# Patient Record
Sex: Female | Born: 1950 | Race: White | Hispanic: No | Marital: Married | Smoking: Former smoker
Health system: Southern US, Community
[De-identification: ages and names within clinical notes are randomized; demographics above are authoritative.]

## PROBLEM LIST (undated history)

## (undated) DIAGNOSIS — B019 Varicella without complication: Secondary | ICD-10-CM

## (undated) DIAGNOSIS — M51369 Other intervertebral disc degeneration, lumbar region without mention of lumbar back pain or lower extremity pain: Secondary | ICD-10-CM

## (undated) DIAGNOSIS — Z87440 Personal history of urinary (tract) infections: Secondary | ICD-10-CM

## (undated) DIAGNOSIS — E785 Hyperlipidemia, unspecified: Secondary | ICD-10-CM

## (undated) DIAGNOSIS — L409 Psoriasis, unspecified: Secondary | ICD-10-CM

## (undated) DIAGNOSIS — M5136 Other intervertebral disc degeneration, lumbar region: Secondary | ICD-10-CM

## (undated) DIAGNOSIS — K219 Gastro-esophageal reflux disease without esophagitis: Secondary | ICD-10-CM

## (undated) DIAGNOSIS — I10 Essential (primary) hypertension: Secondary | ICD-10-CM

## (undated) DIAGNOSIS — M5126 Other intervertebral disc displacement, lumbar region: Secondary | ICD-10-CM

## (undated) DIAGNOSIS — M1990 Unspecified osteoarthritis, unspecified site: Secondary | ICD-10-CM

## (undated) HISTORY — DX: Personal history of urinary (tract) infections: Z87.440

## (undated) HISTORY — DX: Unspecified osteoarthritis, unspecified site: M19.90

## (undated) HISTORY — DX: Essential (primary) hypertension: I10

## (undated) HISTORY — DX: Varicella without complication: B01.9

## (undated) HISTORY — PX: FRACTURE SURGERY: SHX138

## (undated) HISTORY — DX: Gastro-esophageal reflux disease without esophagitis: K21.9

## (undated) HISTORY — DX: Hyperlipidemia, unspecified: E78.5

---

## 2009-06-27 HISTORY — PX: CHOLECYSTECTOMY: SHX55

## 2010-01-19 ENCOUNTER — Emergency Department: Payer: Self-pay | Admitting: Emergency Medicine

## 2010-02-01 ENCOUNTER — Ambulatory Visit: Payer: Self-pay | Admitting: Surgery

## 2010-02-09 ENCOUNTER — Ambulatory Visit: Payer: Self-pay | Admitting: Surgery

## 2012-10-02 ENCOUNTER — Ambulatory Visit: Payer: Self-pay

## 2012-11-29 ENCOUNTER — Ambulatory Visit: Payer: Self-pay | Admitting: Urology

## 2013-04-17 ENCOUNTER — Ambulatory Visit: Payer: Self-pay | Admitting: Urology

## 2013-04-30 ENCOUNTER — Ambulatory Visit: Payer: Self-pay | Admitting: Internal Medicine

## 2013-05-27 ENCOUNTER — Ambulatory Visit: Payer: Self-pay | Admitting: Internal Medicine

## 2013-07-22 ENCOUNTER — Ambulatory Visit: Payer: Self-pay | Admitting: Internal Medicine

## 2013-07-23 ENCOUNTER — Ambulatory Visit: Payer: Self-pay | Admitting: Internal Medicine

## 2013-07-28 ENCOUNTER — Ambulatory Visit: Payer: Self-pay | Admitting: Internal Medicine

## 2013-07-28 HISTORY — PX: BIOPSY THYROID: PRO38

## 2013-08-28 ENCOUNTER — Ambulatory Visit (INDEPENDENT_AMBULATORY_CARE_PROVIDER_SITE_OTHER): Payer: BC Managed Care – PPO | Admitting: Adult Health

## 2013-08-28 ENCOUNTER — Encounter: Payer: Self-pay | Admitting: Adult Health

## 2013-08-28 VITALS — BP 135/93 | HR 87 | Temp 98.2°F | Resp 14 | Ht 65.7 in | Wt 236.0 lb

## 2013-08-28 DIAGNOSIS — R42 Dizziness and giddiness: Secondary | ICD-10-CM

## 2013-08-28 DIAGNOSIS — E559 Vitamin D deficiency, unspecified: Secondary | ICD-10-CM | POA: Insufficient documentation

## 2013-08-28 LAB — COMPREHENSIVE METABOLIC PANEL
ALT: 37 U/L — ABNORMAL HIGH (ref 0–35)
AST: 27 U/L (ref 0–37)
Albumin: 3.8 g/dL (ref 3.5–5.2)
Alkaline Phosphatase: 101 U/L (ref 39–117)
BILIRUBIN TOTAL: 0.9 mg/dL (ref 0.3–1.2)
BUN: 12 mg/dL (ref 6–23)
CO2: 26 meq/L (ref 19–32)
CREATININE: 0.8 mg/dL (ref 0.4–1.2)
Calcium: 9.6 mg/dL (ref 8.4–10.5)
Chloride: 105 mEq/L (ref 96–112)
GFR: 72.64 mL/min (ref >60.00)
GLUCOSE: 88 mg/dL (ref 70–99)
Potassium: 5 mEq/L (ref 3.5–5.1)
SODIUM: 139 meq/L (ref 135–145)
TOTAL PROTEIN: 6.7 g/dL (ref 6.0–8.3)

## 2013-08-28 LAB — MAGNESIUM: MAGNESIUM: 2.1 mg/dL (ref 1.5–2.5)

## 2013-08-28 NOTE — Patient Instructions (Signed)
   Thank you for choosing [place] [facility] for your health care needs.  Please have your labs drawn prior to leaving the office.  The results will be available through MyChart for your convenience. Please remember to activate this. The activation code is located at the end of this form. If you have not set this up we will contact you via telephone.  Schedule your physical exam including breast exam and PAP in June. At that time I will send you for a mammogram and for a screening colonoscopy. I will also do blood work so it would be best if you fast the night before - nothing to eat or drink after midnight. You may drink water.

## 2013-08-28 NOTE — Progress Notes (Signed)
Pre visit review using our clinic review tool, if applicable. No additional management support is needed unless otherwise documented below in the visit note. 

## 2013-08-28 NOTE — Progress Notes (Signed)
Patient ID: Alice Campbell, female   DOB: 01/14/1951, 63 y.o.   MRN: [id number]    Subjective:    Patient ID: Alice Campbell, female    DOB: 01/14/1951, 63 y.o.   MRN: [id number]  HPI  Pt is a pleasant 63 y/o female who presents to clinic to establish care. She has not had a PCP in many years. Recently she is being followed by Dr. Sherrlyn HockPandit for abnormal CT finding. She reports that the area on her lungs are very small and they are currently just monitoring. She has a follow up appt with Dr. Sherrlyn HockPandit in July for f/u CT. She is feeling well over all. She has not had a screening colonoscopy and her last mammogram was ~ 4 years ago. Does not remember when her last PAP was but believes it was 3-4 years ago. Pt reports being under considerable amount of stress 2/2 her abnormal CT. She does not wish to address any screening test at this time. She would like to wait a while. Pt reports hx of vitamin D deficiency. She has been on high dose Vit D but has not taken any supplements for a while.    Past Medical History  Diagnosis Date  . Arthritis   . Chicken pox   . GERD (gastroesophageal reflux disease)   . Hypertension   . History of UTI     [facility]     Past Surgical History  Procedure Laterality Date  . Cholecystectomy  2011  . Biopsy thyroid  07/2013     Family History  Problem Relation Age of Onset  . Heart disease Mother   . Hypertension Mother   . Mental illness Mother     Nervous breakdown   . Cancer Father     Prostate Cancer  . Heart disease Father   . Hypertension Father      History   Social History  . Marital Status: Married    Spouse Name: N/A    Number of Children: N/A  . Years of Education: 12   Occupational History  . Public Work     Semi-retired   Social History Main Topics  . Smoking status: Former Games developermoker  . Smokeless tobacco: Not on file     Comment: quit in 2000  . Alcohol Use: 1 - 2 oz/week    2-4 drink(s) per week     Comment: few drinks a week    . Drug Use: No  . Sexual Activity: Not on file   Other Topics Concern  . Not on file   Social History Narrative   [name] grew up in [place], [place]. She is currently living in [place] with her husband. She enjoys swimming and reading.     Review of Systems  Constitutional: Negative.   Respiratory: Negative.   Cardiovascular: Negative for chest pain and leg swelling.  Neurological: Negative for tremors, syncope, facial asymmetry, weakness, light-headedness, numbness and headaches. Dizziness: vertigo x 5-6 years - comes and goes.       Objective:  BP 135/93  Pulse 87  Temp(Src) 98.2 F (36.8 C) (Oral)  Resp 14  Ht 5' 5.7" (1.669 m)  Wt 236 lb (107.049 kg)  BMI 38.43 kg/m2  SpO2 97%   Physical Exam  Constitutional: She is oriented to person, place, and time. She appears well-developed and well-nourished. No distress.  Overweight, pleasant 63 y/o female in NAD  HENT:  Head: Normocephalic and atraumatic.  Right Ear: External ear normal.  Left Ear: External  ear normal.  Eyes: Conjunctivae and EOM are normal. Pupils are equal, round, and reactive to light.  Neck: Normal range of motion. Neck supple. No tracheal deviation present.  Cardiovascular: Normal rate, regular rhythm, normal heart sounds and intact distal pulses.  Exam reveals no gallop and no friction rub.   No murmur heard. Pulmonary/Chest: Effort normal and breath sounds normal. No respiratory distress. She has no wheezes. She has no rales.  Musculoskeletal: Normal range of motion. She exhibits no edema.  Neurological: She is alert and oriented to person, place, and time. She has normal reflexes. Coordination normal.  Skin: Skin is warm and dry.  Psychiatric: She has a normal mood and affect. Her behavior is normal. Judgment and thought content normal.       Assessment & Plan:   1. Vertigo Reports symptoms "off and on". Has used Antivert in the past with good results. Has not been evaluated by PT. We  discussed this as on option if symptoms persist. Discussed referral to ENT if necessary. Does not wish to have referral to either at this time. Will check metabolic panel.  - Comprehensive metabolic panel - Magnesium  2. Vitamin D deficiency With her reported hx, I will check levels and treat accordingly. If level WNL will have patient take OTC supplement. - Vit D  25 hydroxy (rtn osteoporosis monitoring)

## 2013-08-29 LAB — VITAMIN D 25 HYDROXY (VIT D DEFICIENCY, FRACTURES): Vit D, 25-Hydroxy: 32 ng/mL (ref 30–89)

## 2013-08-30 ENCOUNTER — Encounter: Payer: Self-pay | Admitting: *Deleted

## 2013-10-25 ENCOUNTER — Encounter: Payer: Self-pay | Admitting: Adult Health

## 2013-10-25 ENCOUNTER — Ambulatory Visit (INDEPENDENT_AMBULATORY_CARE_PROVIDER_SITE_OTHER): Payer: BC Managed Care – PPO | Admitting: Adult Health

## 2013-10-25 VITALS — BP 132/84 | HR 87 | Temp 98.0°F | Resp 14 | Wt 236.0 lb

## 2013-10-25 DIAGNOSIS — R109 Unspecified abdominal pain: Secondary | ICD-10-CM

## 2013-10-25 DIAGNOSIS — N390 Urinary tract infection, site not specified: Secondary | ICD-10-CM | POA: Insufficient documentation

## 2013-10-25 LAB — POCT URINALYSIS DIPSTICK
Glucose, UA: NEGATIVE
Leukocytes, UA: NEGATIVE
Nitrite, UA: NEGATIVE
SPEC GRAV UA: 1.025
UROBILINOGEN UA: 1
pH, UA: 5.5

## 2013-10-25 MED ORDER — NITROFURANTOIN MONOHYD MACRO 100 MG PO CAPS: 100.0000 mg | ORAL_CAPSULE | Freq: Two times a day (BID) | ORAL | Status: DC

## 2013-10-25 MED ORDER — ONDANSETRON HCL 4 MG PO TABS: 4.0000 mg | ORAL_TABLET | Freq: Three times a day (TID) | ORAL | Status: DC | PRN

## 2013-10-25 NOTE — Progress Notes (Signed)
Pre visit review using our clinic review tool, if applicable. No additional management support is needed unless otherwise documented below in the visit note. 

## 2013-10-25 NOTE — Progress Notes (Signed)
Patient ID: Alice Campbell, female   DOB: 1951-05-08, 63 y.o.   MRN: [id number]   Subjective:    Patient ID: Alice Churchesamela Joplin, female    DOB: 1951-05-08, 63 y.o.   MRN: [id number]  HPI  Patient is a pleasant 63 year old female first seen in my clinic to establish care on 08/28/2013. She presents to clinic today with concerns of recurrent UTIs. She reports that these have been ongoing for approximately 3 years. For the first year she was seen multiple times at urgent care. Then she established care with Michiel CowboyShannon McGowan, PA at [facility]. Patient reports that in the period of time that she has been to see Ms. McGowan she has had CT scan, cystoscopy, stretching of the urethra. She reports being on multiple courses of antibiotics without resolution of her recurrent symptoms. She was seen by Ms. McGowan approximately 10 days ago and was started on amoxicillin for a urinary tract infection. Patient reports completing antibiotics approximately 2 days ago. She has been nauseated, complaints of flank pain (although none at the present time). Ms. Marvel PlanMcGowan wanted to send patient for a repeat CT scan to rule out kidney stone. Patient had an appointment but called and canceled. She reports that she has already had a CT scan when she was having the exact same symptoms and there was no stone detected. She has had 2 recurrences with hematuria I detected. Patient reports that these were also worked up. Patient is extremely frustrated that she keeps having the same problem over and over. She would like a second opinion.    Past Medical History  Diagnosis Date  . Arthritis   . Chicken pox   . GERD (gastroesophageal reflux disease)   . Hypertension   . History of UTI     [facility]     Past Surgical History  Procedure Laterality Date  . Cholecystectomy  2011  . Biopsy thyroid  07/2013     Family History  Problem Relation Age of Onset  . Heart disease Mother   . Hypertension Mother   . Mental  illness Mother     Nervous breakdown   . Cancer Father     Prostate Cancer  . Heart disease Father   . Hypertension Father      History   Social History  . Marital Status: Married    Spouse Name: N/A    Number of Children: N/A  . Years of Education: 12   Occupational History  . Public Work     Semi-retired   Social History Main Topics  . Smoking status: Former Games developermoker  . Smokeless tobacco: Not on file     Comment: quit in 2000  . Alcohol Use: 1.0 - 2.0 oz/week    2-4 drink(s) per week     Comment: few drinks a week   . Drug Use: No  . Sexual Activity: Not on file   Other Topics Concern  . Not on file   Social History Narrative   Mrs. Culton grew up in [place], [place]. She is currently living in [place] with her husband. She enjoys swimming and reading.     Review of Systems  Constitutional: Negative for fever and chills.  Endocrine:       Pt is not a diabetic  Genitourinary: Positive for dysuria, urgency and frequency. Negative for hematuria, flank pain and pelvic pain.       She denies hx of bladder prolapse  All other systems reviewed and are negative.  Objective:  BP 132/84  Pulse 87  Temp(Src) 98 F (36.7 C) (Oral)  Resp 14  Wt 236 lb (107.049 kg)  SpO2 99%   Physical Exam  Constitutional: She is oriented to person, place, and time. No distress.  Overweight, pleasant 63 year old female in no apparent distress  HENT:  Head: Normocephalic and atraumatic.  Neck: Normal range of motion. Neck supple.  Abdominal: Soft. She exhibits no mass. There is no tenderness. There is no rebound and no guarding.  Genitourinary:  No suprapubic tenderness upon palpation. Patient does not exhibit any costovertebral angle tenderness.  Musculoskeletal: Normal range of motion.  Neurological: She is alert and oriented to person, place, and time. She has normal reflexes. Coordination normal.  Skin: Skin is warm and dry.  Psychiatric: She has a normal mood and  affect. Her behavior is normal. Judgment and thought content normal.      Assessment & Plan:   1. Flank pain She is currently not experiencing any flank pain, nausea or vomiting. Her recent symptoms are suspicious for kidney stones. As mentioned in the history of present illness, patient had an appointment to have a CT scan done but she canceled her appointment. I will check her kidney function today.  - Basic Metabolic Panel (BMET)  2. Recurrent UTI Just completed antibiotics 2 days ago and already having symptoms. I am referring her to [facility] for further evaluation and also for the second opinion that she is requesting. I am starting her on Macrobid 100 mg twice a day x2 weeks.   - Ambulatory referral to Urogynecology - Basic Metabolic Panel (BMET) - POCT urinalysis dipstick - Basic Metabolic Panel (BMET)

## 2013-10-25 NOTE — Patient Instructions (Signed)
  Macrobid 100 mg twice a day for 2 weeks.  Make sure he drinks fluids to stay hydrated.  I am sending in a prescription for Zofran for nausea.  I am also referring you to [facility] for second opinion.  Vitamin D3 - 2000 units daily. Your levels were normal but on the lower end.

## 2013-10-26 LAB — BASIC METABOLIC PANEL
BUN: 13 mg/dL (ref 6–23)
CALCIUM: 9.3 mg/dL (ref 8.4–10.5)
CO2: 26 mEq/L (ref 19–32)
Chloride: 106 mEq/L (ref 96–112)
Creat: 0.93 mg/dL (ref 0.50–1.10)
GLUCOSE: 122 mg/dL — AB (ref 70–99)
Potassium: 4.3 mEq/L (ref 3.5–5.3)
SODIUM: 141 meq/L (ref 135–145)

## 2013-11-04 ENCOUNTER — Telehealth: Payer: Self-pay | Admitting: Adult Health

## 2013-11-04 NOTE — Telephone Encounter (Signed)
Patient is still having some issues (unsure what) and wanted to know if she should see you before her appointment with [facility]. Please advise.

## 2013-11-04 NOTE — Telephone Encounter (Signed)
She was having issues with UTI when she came to see me. Please call and find out if anything has changed. What specific symptoms is she having. If she is hurting she will need to see me.

## 2013-11-04 NOTE — Telephone Encounter (Signed)
I called patient to let her know that her appointment with [facility]  was for November 25, 2013 at 11:00. She states that she is still having issues and wanted to make sure you knew when her appointment was and to see if she needed to come back and see you. Please advise

## 2013-11-05 NOTE — Telephone Encounter (Signed)
Patient states that she was hurting on Sunday but today she feels better. Informed her of Raquel's comments. Patient verbalized understanding. She stated that she would be out of town until Friday and that is when her antibiotics will be complete. She prefers to finish the antibiotic course and if she is still having symptoms she will call on Monday to schedule an appointment with Raquel at that time.

## 2013-11-12 ENCOUNTER — Ambulatory Visit (INDEPENDENT_AMBULATORY_CARE_PROVIDER_SITE_OTHER): Payer: BC Managed Care – PPO | Admitting: Adult Health

## 2013-11-12 ENCOUNTER — Encounter: Payer: Self-pay | Admitting: Adult Health

## 2013-11-12 VITALS — BP 140/88 | HR 80 | Temp 98.1°F | Resp 14 | Wt 236.0 lb

## 2013-11-12 DIAGNOSIS — N390 Urinary tract infection, site not specified: Secondary | ICD-10-CM

## 2013-11-12 DIAGNOSIS — R350 Frequency of micturition: Secondary | ICD-10-CM

## 2013-11-12 LAB — POCT URINALYSIS DIPSTICK
Bilirubin, UA: NEGATIVE
Glucose, UA: NEGATIVE
Ketones, UA: NEGATIVE
NITRITE UA: POSITIVE
PH UA: 6
PROTEIN UA: NEGATIVE
Spec Grav, UA: 1.02
UROBILINOGEN UA: 0.2

## 2013-11-12 MED ORDER — CIPROFLOXACIN HCL 250 MG PO TABS: 250.0000 mg | ORAL_TABLET | Freq: Two times a day (BID) | ORAL | Status: DC

## 2013-11-12 NOTE — Progress Notes (Signed)
Pre visit review using our clinic review tool, if applicable. No additional management support is needed unless otherwise documented below in the visit note. 

## 2013-11-12 NOTE — Patient Instructions (Signed)
  Start Cipro 250 mg twice daily for 7 days.  AZO for your discomfort. Take as directed.  Avoid tea and dark carbonated drinks.  Drink water  Please call with any questions or concerns.

## 2013-11-12 NOTE — Progress Notes (Signed)
   Subjective:    Patient ID: Alice Campbell, female    DOB: April 01, 1951, 63 y.o.   MRN: [id number]  HPI Patient is a pleasant 63 year old female who presents to clinic with dysuria. History of recurrent urinary tract infections. She is scheduled with [facility] on 11/25/2013 for second opinion. Patient had been followed by [place]rologist in [place] without any improvement of her symptoms. She reports also experiencing some suprapubic discomfort and urgency. Denies hematuria. She is sexually active; however, she reports no sexual intercourse in greater than four-week. She drinks carbonated beverages and lots of tea. She has tried cranberry tablets in the past without any improvement. Reports that the only thing that helps her symptoms is AZO.   Past Medical History  Diagnosis Date  . Arthritis   . Chicken pox   . GERD (gastroesophageal reflux disease)   . Hypertension   . History of UTI     [facility]    Current Outpatient Prescriptions on File Prior to Visit  Medication Sig Dispense Refill  . diclofenac (VOLTAREN) 75 MG EC tablet Take 75 mg by mouth once.      . ondansetron (ZOFRAN) 4 MG tablet Take 1 tablet (4 mg total) by mouth every 8 (eight) hours as needed for nausea or vomiting.  20 tablet  0   No current facility-administered medications on file prior to visit.   Review of Systems  Constitutional: Negative for fever and chills.  Genitourinary: Positive for dysuria, urgency and frequency. Negative for hematuria, flank pain and difficulty urinating.       Suprapubic discomfort  All other systems reviewed and are negative.      Objective:   Physical Exam  Constitutional: She is oriented to person, place, and time. No distress.  Cardiovascular: Normal rate and regular rhythm.   Pulmonary/Chest: Effort normal. No respiratory distress.  Musculoskeletal: Normal range of motion.  Neurological: She is alert and oriented to person, place, and time.  Skin: Skin is warm  and dry.  Psychiatric: She has a normal mood and affect. Her behavior is normal. Judgment and thought content normal.   BP 140/88  Pulse 80  Temp(Src) 98.1 F (36.7 C) (Oral)  Resp 14  Wt 236 lb (107.049 kg)  SpO2 98%      Assessment & Plan:   1. Frequent urination UA positive for nitrites, blood and leukocytes. Send urine for culture - POCT Urinalysis Dipstick - Urine Culture  2. Recurrent UTI Recently treated for UTI now presents with symptoms again. App[name] on 11/25/13. Pt is extremely frustrated with her symptoms and recurrence. Will start Cipro 250 bid x 7 days. I do not want her to be on any antibiotic for her appointment with urology. She verbalizes understanding. Advised to also take AZO for her symptoms. Call with any concerns.

## 2013-11-14 LAB — URINE CULTURE

## 2013-11-25 DIAGNOSIS — IMO0002 Reserved for concepts with insufficient information to code with codable children: Secondary | ICD-10-CM | POA: Insufficient documentation

## 2013-12-09 ENCOUNTER — Other Ambulatory Visit (HOSPITAL_COMMUNITY)
Admission: RE | Admit: 2013-12-09 | Discharge: 2013-12-09 | Disposition: A | Discharge status: Home / Self Care | Payer: BC Managed Care – PPO | Source: Ambulatory Visit | Attending: Adult Health | Admitting: Adult Health

## 2013-12-09 ENCOUNTER — Ambulatory Visit (INDEPENDENT_AMBULATORY_CARE_PROVIDER_SITE_OTHER): Payer: BC Managed Care – PPO | Admitting: Adult Health

## 2013-12-09 ENCOUNTER — Encounter: Payer: Self-pay | Admitting: Adult Health

## 2013-12-09 VITALS — BP 130/80 | HR 67 | Temp 98.4°F | Resp 14 | Ht 65.7 in | Wt 233.5 lb

## 2013-12-09 DIAGNOSIS — Z1211 Encounter for screening for malignant neoplasm of colon: Secondary | ICD-10-CM

## 2013-12-09 DIAGNOSIS — R5381 Other malaise: Secondary | ICD-10-CM

## 2013-12-09 DIAGNOSIS — R5383 Other fatigue: Secondary | ICD-10-CM

## 2013-12-09 DIAGNOSIS — Z0000 Encounter for general adult medical examination without abnormal findings: Secondary | ICD-10-CM

## 2013-12-09 DIAGNOSIS — Z01419 Encounter for gynecological examination (general) (routine) without abnormal findings: Secondary | ICD-10-CM | POA: Insufficient documentation

## 2013-12-09 DIAGNOSIS — Z124 Encounter for screening for malignant neoplasm of cervix: Secondary | ICD-10-CM

## 2013-12-09 DIAGNOSIS — Z1151 Encounter for screening for human papillomavirus (HPV): Secondary | ICD-10-CM | POA: Insufficient documentation

## 2013-12-09 DIAGNOSIS — Z1239 Encounter for other screening for malignant neoplasm of breast: Secondary | ICD-10-CM

## 2013-12-09 DIAGNOSIS — Z23 Encounter for immunization: Secondary | ICD-10-CM

## 2013-12-09 LAB — CBC WITH DIFFERENTIAL/PLATELET
Basophils Absolute: 0 10*3/uL (ref 0.0–0.1)
Basophils Relative: 0.8 % (ref 0.0–3.0)
Eosinophils Absolute: 0.1 10*3/uL (ref 0.0–0.7)
Eosinophils Relative: 3.2 % (ref 0.0–5.0)
HCT: 45 % (ref 36.0–46.0)
Hemoglobin: 15.1 g/dL — ABNORMAL HIGH (ref 12.0–15.0)
LYMPHS ABS: 1.4 10*3/uL (ref 0.7–4.0)
Lymphocytes Relative: 36.3 % (ref 12.0–46.0)
MCHC: 33.4 g/dL (ref 30.0–36.0)
MCV: 95.1 fl (ref 78.0–100.0)
MONO ABS: 0.4 10*3/uL (ref 0.1–1.0)
Monocytes Relative: 9.9 % (ref 3.0–12.0)
Neutro Abs: 1.9 10*3/uL (ref 1.4–7.7)
Neutrophils Relative %: 49.8 % (ref 43.0–77.0)
PLATELETS: 141 10*3/uL — AB (ref 150.0–400.0)
RBC: 4.73 Mil/uL (ref 3.87–5.11)
RDW: 13.4 % (ref 11.5–15.5)
WBC: 3.8 10*3/uL — ABNORMAL LOW (ref 4.0–10.5)

## 2013-12-09 LAB — COMPREHENSIVE METABOLIC PANEL
ALK PHOS: 82 U/L (ref 39–117)
ALT: 22 U/L (ref 0–35)
AST: 22 U/L (ref 0–37)
Albumin: 4 g/dL (ref 3.5–5.2)
BILIRUBIN TOTAL: 0.5 mg/dL (ref 0.2–1.2)
BUN: 14 mg/dL (ref 6–23)
CO2: 28 mEq/L (ref 19–32)
Calcium: 9.3 mg/dL (ref 8.4–10.5)
Chloride: 104 mEq/L (ref 96–112)
Creatinine, Ser: 0.9 mg/dL (ref 0.4–1.2)
GFR: 66.17 mL/min (ref >60.00)
Glucose, Bld: 107 mg/dL — ABNORMAL HIGH (ref 70–99)
Potassium: 4.8 mEq/L (ref 3.5–5.1)
Sodium: 136 mEq/L (ref 135–145)
TOTAL PROTEIN: 6.9 g/dL (ref 6.0–8.3)

## 2013-12-09 LAB — LIPID PANEL
CHOLESTEROL: 249 mg/dL — AB (ref 0–200)
HDL: 54 mg/dL (ref >39.00)
LDL Cholesterol: 147 mg/dL — ABNORMAL HIGH (ref 0–99)
NonHDL: 195
Total CHOL/HDL Ratio: 5
Triglycerides: 238 mg/dL — ABNORMAL HIGH (ref 0.0–149.0)
VLDL: 47.6 mg/dL — ABNORMAL HIGH (ref 0.0–40.0)

## 2013-12-09 LAB — VITAMIN B12: VITAMIN B 12: 357 pg/mL (ref 211–911)

## 2013-12-09 LAB — TSH: TSH: 0.46 u[IU]/mL (ref 0.35–4.50)

## 2013-12-09 MED ORDER — ZOSTER VACCINE LIVE 19400 UNT/0.65ML ~~LOC~~ SOLR: 0.6500 mL | Freq: Once | SUBCUTANEOUS | Status: DC

## 2013-12-09 NOTE — Progress Notes (Signed)
Patient ID: Alice Campbell, female   DOB: 11/05/1950, 63 y.o.   MRN: [id number]   Subjective:    Patient ID: Alice Campbell, female    DOB: 11/05/1950, 63 y.o.   MRN: [id number]  HPI Pt is a pleasant 63 y/o female who presents for her yearly physical exam including PAP/pelvic and breast exam. She is feeling well overall except for slight fatigue. No concerns this visit.  Past Medical History  Diagnosis Date  . Arthritis   . Chicken pox   . GERD (gastroesophageal reflux disease)   . Hypertension   . History of UTI     [facility]     Past Surgical History  Procedure Laterality Date  . Cholecystectomy  2011  . Biopsy thyroid  07/2013     Family History  Problem Relation Age of Onset  . Heart disease Mother   . Hypertension Mother   . Mental illness Mother     Nervous breakdown   . Cancer Father     Prostate Cancer  . Heart disease Father   . Hypertension Father      History   Social History  . Marital Status: Married    Spouse Name: N/A    Number of Children: N/A  . Years of Education: 12   Occupational History  . Public Work     Semi-retired   Social History Main Topics  . Smoking status: Former Games developermoker  . Smokeless tobacco: Not on file     Comment: quit in 2000  . Alcohol Use: 1.0 - 2.0 oz/week    2-4 drink(s) per week     Comment: few drinks a week   . Drug Use: No  . Sexual Activity: Not on file   Other Topics Concern  . Not on file   Social History Narrative   Mrs. Rynders grew up in [place], [place]. She is currently living in [place] with her husband. She enjoys swimming and reading.     Current Outpatient Prescriptions on File Prior to Visit  Medication Sig Dispense Refill  . diclofenac (VOLTAREN) 75 MG EC tablet Take 75 mg by mouth once.      . ondansetron (ZOFRAN) 4 MG tablet Take 1 tablet (4 mg total) by mouth every 8 (eight) hours as needed for nausea or vomiting.  20 tablet  0   No current facility-administered medications on file  prior to visit.     Review of Systems  Constitutional: Positive for fatigue.  HENT: Negative.   Eyes: Negative.   Respiratory: Negative.   Cardiovascular: Negative.   Gastrointestinal: Negative.   Endocrine: Negative.   Genitourinary: Negative.   Musculoskeletal: Negative.   Skin: Negative.   Allergic/Immunologic: Negative.   Neurological: Negative.   Hematological: Negative.   Psychiatric/Behavioral: Negative.        Objective:  BP 130/80  Pulse 67  Temp(Src) 98.4 F (36.9 C) (Oral)  Resp 14  Ht 5' 5.7" (1.669 m)  Wt 233 lb 8 oz (105.915 kg)  BMI 38.02 kg/m2  SpO2 97%   Physical Exam  Constitutional: She is oriented to person, place, and time. No distress.  Overweight, pleasant 63 y/o female  HENT:  Head: Normocephalic and atraumatic.  Right Ear: External ear normal.  Left Ear: External ear normal.  Nose: Nose normal.  Mouth/Throat: Oropharynx is clear and moist.  Eyes: Conjunctivae and EOM are normal. Pupils are equal, round, and reactive to light.  Neck: Normal range of motion. Neck supple. No tracheal deviation present. No  thyromegaly present.  Cardiovascular: Normal rate, regular rhythm, normal heart sounds and intact distal pulses.  Exam reveals no gallop and no friction rub.   No murmur heard. Pulmonary/Chest: Effort normal and breath sounds normal. No respiratory distress. She has no wheezes. She has no rales. Right breast exhibits no inverted nipple, no mass, no nipple discharge, no skin change and no tenderness. Left breast exhibits no inverted nipple, no mass, no nipple discharge, no skin change and no tenderness. Breasts are symmetrical.  Abdominal: Soft. Bowel sounds are normal. She exhibits no distension and no mass. There is no tenderness. There is no rebound and no guarding.  Genitourinary: No labial fusion. There is no rash, tenderness, lesion or injury on the right labia. There is no rash, tenderness, lesion or injury on the left labia.    Musculoskeletal: Normal range of motion. She exhibits no edema and no tenderness.  Lymphadenopathy:    She has no cervical adenopathy.  Neurological: She is alert and oriented to person, place, and time. She has normal reflexes. No cranial nerve deficit. Coordination normal.  Skin: Skin is warm and dry.  Psychiatric: She has a normal mood and affect. Her behavior is normal. Judgment and thought content normal.       Assessment & Plan:   1. Routine general medical examination at a health care facility Normal physical exam including breast, Pelvic/PAP. Screenings addressed.  - Comprehensive metabolic panel - Lipid panel  2. Breast cancer screening Mammogram ordered - MM DIGITAL SCREENING BILATERAL; Future  3. Colon cancer screening Refer for screening colonoscopy - Ambulatory referral to Gastroenterology  4. Pap smear for cervical cancer screening Normal exam. External genitalia without lesions, ulcerations, inflammation, warts or discharge. Marland Kitchen. Speculum examination normal. Cervix without inflammation, lesions, growth, nodules or discharge noted. There was no bleeding. Vaginal walls also normal - pink and rugose without inflammation, discharge, ulcers or color changes. Bimanual exam also normal.  No tenderness noted with palpation of the uterus. No adnexal masses appreciated during exam.  - Cytology - PAP  5. Need for shingles vaccine Provided prescription to take to her pharmacy for administration.  - zoster vaccine live, PF, (ZOSTAVAX) [id number] UNT/0.65ML injection; Inject 19,400 Units into the skin once.  Dispense: 1 each; Refill: 0  6. Need for TD vaccine Administered during clinic - Td vaccine preservative free greater than or equal to 7yo IM  7. Fatigue Check labs. Follow - TSH - CBC with Differential - Vitamin B12

## 2013-12-09 NOTE — Patient Instructions (Signed)
  You had your annual physical exam today including Pap.  I am referring you for the following:   Colonoscopy  Mammogram  I have sent in a prescription for your shingles vaccine to pharmacy. Please inquire about the cost prior to receiving.  She received your tetanus vaccine today  I will contact you with the results once they are available.

## 2013-12-09 NOTE — Progress Notes (Signed)
Pre visit review using our clinic review tool, if applicable. No additional management support is needed unless otherwise documented below in the visit note. 

## 2013-12-12 LAB — CYTOLOGY - PAP

## 2013-12-16 ENCOUNTER — Encounter: Payer: Self-pay | Admitting: Adult Health

## 2013-12-16 ENCOUNTER — Other Ambulatory Visit: Payer: Self-pay | Admitting: Adult Health

## 2013-12-16 MED ORDER — ATORVASTATIN CALCIUM 20 MG PO TABS: 20.0000 mg | ORAL_TABLET | Freq: Every day | ORAL | Status: DC

## 2013-12-24 ENCOUNTER — Ambulatory Visit (INDEPENDENT_AMBULATORY_CARE_PROVIDER_SITE_OTHER): Payer: BC Managed Care – PPO | Admitting: *Deleted

## 2013-12-24 DIAGNOSIS — Z23 Encounter for immunization: Secondary | ICD-10-CM

## 2014-01-10 ENCOUNTER — Telehealth: Payer: Self-pay | Admitting: Adult Health

## 2014-01-10 NOTE — Telephone Encounter (Signed)
LMTCB

## 2014-01-15 ENCOUNTER — Ambulatory Visit: Payer: Self-pay | Admitting: Internal Medicine

## 2014-01-27 ENCOUNTER — Ambulatory Visit: Payer: Self-pay | Admitting: Internal Medicine

## 2014-02-25 ENCOUNTER — Ambulatory Visit: Payer: Self-pay | Admitting: Internal Medicine

## 2014-03-12 ENCOUNTER — Ambulatory Visit: Payer: Self-pay | Admitting: Adult Health

## 2014-03-12 LAB — HM MAMMOGRAPHY: HM Mammogram: NEGATIVE

## 2014-03-13 ENCOUNTER — Encounter: Payer: Self-pay | Admitting: *Deleted

## 2014-03-16 ENCOUNTER — Other Ambulatory Visit: Payer: Self-pay | Admitting: Adult Health

## 2014-03-21 ENCOUNTER — Ambulatory Visit: Payer: Self-pay | Admitting: Gastroenterology

## 2014-03-21 LAB — HM COLONOSCOPY

## 2014-03-24 LAB — PATHOLOGY REPORT

## 2014-04-02 ENCOUNTER — Encounter: Payer: Self-pay | Admitting: *Deleted

## 2014-04-30 ENCOUNTER — Telehealth: Payer: Self-pay | Admitting: Adult Health

## 2014-04-30 DIAGNOSIS — E785 Hyperlipidemia, unspecified: Secondary | ICD-10-CM

## 2014-04-30 DIAGNOSIS — Z79899 Other long term (current) drug therapy: Secondary | ICD-10-CM

## 2014-04-30 NOTE — Telephone Encounter (Signed)
Yes she needs to have fasting labs done next week.  Labs ordered

## 2014-04-30 NOTE — Telephone Encounter (Signed)
[name] is wondering if she needs to have labs drawn. Before Alice Campbell left, she put her on a new Cholesterol medication and [name] can't tell if it's working or not. She heard bad cholesterol and medication used for it can effect the liver and she's concerned. Please call the pt to let her know if she needs to have labs drawn. Pt ph# [phone] Thank you.

## 2014-04-30 NOTE — Telephone Encounter (Signed)
Spoke with pt, appoint scheduled 

## 2014-04-30 NOTE — Telephone Encounter (Signed)
pts last appoint was 6.15.15.  Please advise lab orders

## 2014-05-07 ENCOUNTER — Encounter: Payer: Self-pay | Admitting: Adult Health

## 2014-05-08 ENCOUNTER — Other Ambulatory Visit (INDEPENDENT_AMBULATORY_CARE_PROVIDER_SITE_OTHER): Payer: BC Managed Care – PPO

## 2014-05-08 DIAGNOSIS — Z79899 Other long term (current) drug therapy: Secondary | ICD-10-CM

## 2014-05-08 DIAGNOSIS — E785 Hyperlipidemia, unspecified: Secondary | ICD-10-CM

## 2014-05-08 LAB — COMPREHENSIVE METABOLIC PANEL
ALT: 18 U/L (ref 0–35)
AST: 21 U/L (ref 0–37)
Albumin: 3 g/dL — ABNORMAL LOW (ref 3.5–5.2)
Alkaline Phosphatase: 88 U/L (ref 39–117)
BUN: 15 mg/dL (ref 6–23)
CALCIUM: 9.2 mg/dL (ref 8.4–10.5)
CO2: 26 mEq/L (ref 19–32)
CREATININE: 0.7 mg/dL (ref 0.4–1.2)
Chloride: 106 mEq/L (ref 96–112)
GFR: 85.23 mL/min (ref >60.00)
Glucose, Bld: 88 mg/dL (ref 70–99)
Potassium: 4.8 mEq/L (ref 3.5–5.1)
Sodium: 138 mEq/L (ref 135–145)
Total Bilirubin: 0.6 mg/dL (ref 0.2–1.2)
Total Protein: 6.4 g/dL (ref 6.0–8.3)

## 2014-05-08 LAB — LIPID PANEL
Cholesterol: 182 mg/dL (ref 0–200)
HDL: 47.6 mg/dL (ref >39.00)
LDL Cholesterol: 115 mg/dL — ABNORMAL HIGH (ref 0–99)
NONHDL: 134.4
Total CHOL/HDL Ratio: 4
Triglycerides: 99 mg/dL (ref 0.0–149.0)
VLDL: 19.8 mg/dL (ref 0.0–40.0)

## 2014-05-13 ENCOUNTER — Encounter: Payer: Self-pay | Admitting: Adult Health

## 2014-05-27 ENCOUNTER — Encounter (INDEPENDENT_AMBULATORY_CARE_PROVIDER_SITE_OTHER): Payer: Self-pay

## 2014-05-27 ENCOUNTER — Encounter: Payer: Self-pay | Admitting: Internal Medicine

## 2014-05-27 ENCOUNTER — Ambulatory Visit (INDEPENDENT_AMBULATORY_CARE_PROVIDER_SITE_OTHER): Payer: BC Managed Care – PPO | Admitting: Internal Medicine

## 2014-05-27 VITALS — BP 122/82 | HR 81 | Temp 98.2°F | Ht 65.7 in | Wt 236.2 lb

## 2014-05-27 DIAGNOSIS — R9389 Abnormal findings on diagnostic imaging of other specified body structures: Secondary | ICD-10-CM | POA: Insufficient documentation

## 2014-05-27 DIAGNOSIS — R300 Dysuria: Secondary | ICD-10-CM

## 2014-05-27 DIAGNOSIS — N3000 Acute cystitis without hematuria: Secondary | ICD-10-CM

## 2014-05-27 DIAGNOSIS — R938 Abnormal findings on diagnostic imaging of other specified body structures: Secondary | ICD-10-CM

## 2014-05-27 LAB — POCT URINALYSIS DIPSTICK
Bilirubin, UA: NEGATIVE
Glucose, UA: NEGATIVE
Ketones, UA: NEGATIVE
Nitrite, UA: NEGATIVE
Protein, UA: NEGATIVE
Spec Grav, UA: 1.02
UROBILINOGEN UA: 0.2
pH, UA: 6.5

## 2014-05-27 MED ORDER — CIPROFLOXACIN HCL 500 MG PO TABS: 500.0000 mg | ORAL_TABLET | Freq: Two times a day (BID) | ORAL | Status: DC

## 2014-05-27 NOTE — Assessment & Plan Note (Signed)
Symptoms are most consistent with acute cystitis. UA shows trace blood only. Will send urine for culture and start Cipro. Will plan for 2 week course given her h/o urethral surgery. Follow up if symptoms are not improving. [facility] today.

## 2014-05-27 NOTE — Progress Notes (Signed)
Pre visit review using our clinic review tool, if applicable. No additional management support is needed unless otherwise documented below in the visit note. 

## 2014-05-27 NOTE — Patient Instructions (Signed)
Start Cipro 500mg  twice daily.  We will request Chest CT from [facility] and schedule follow up.  Follow up here in 4 weeks.

## 2014-05-27 NOTE — Assessment & Plan Note (Signed)
Previous reported history of abnormal chest CT with pulmonary nodules. Will request report on this. Will plan to repeat follow up CT as needed.

## 2014-05-27 NOTE — Progress Notes (Signed)
Subjective:    Patient ID: Alice Campbell, female    DOB: 1950/10/19, 63 y.o.   MRN: [id number]  HPI 63YO female presents for acute visit.  Dysuria - 2 years ago, started having frequent UTIs. Seen by Urology. Has a history of urethral surgery in the past. Stopped drinking dark liquids and started cranberry pill daily. No UTI from 11/2013. For the last week, has had urinary frequency, urgency with burning and lower abdominal pain. No fever, chills. Took two doses of left over Macrobid she had a home.  Abnormal Chest CT - Previously seen by Dr. Sherrlyn HockPandit. Last CT chest was 01/2014. Has not scheduled follow up. No symptoms of cough, dyspnea. Former smoker.  Review of Systems  Constitutional: Negative for fever, chills, appetite change, fatigue and unexpected weight change.  Eyes: Negative for visual disturbance.  Respiratory: Negative for shortness of breath.   Cardiovascular: Negative for chest pain and leg swelling.  Gastrointestinal: Negative for nausea, vomiting, abdominal pain, diarrhea and constipation.  Genitourinary: Positive for dysuria, urgency, frequency and pelvic pain. Negative for flank pain and difficulty urinating.  Skin: Negative for color change and rash.  Hematological: Negative for adenopathy. Does not bruise/bleed easily.  Psychiatric/Behavioral: Negative for dysphoric mood. The patient is not nervous/anxious.        Objective:    BP 122/82 mmHg  Pulse 81  Temp(Src) 98.2 F (36.8 C) (Oral)  Ht 5' 5.7" (1.669 m)  Wt 236 lb 4 oz (107.162 kg)  BMI 38.47 kg/m2  SpO2 96% Physical Exam  Constitutional: She is oriented to person, place, and time. She appears well-developed and well-nourished. No distress.  HENT:  Head: Normocephalic and atraumatic.  Right Ear: External ear normal.  Left Ear: External ear normal.  Nose: Nose normal.  Mouth/Throat: Oropharynx is clear and moist.  Eyes: Conjunctivae are normal. Pupils are equal, round, and reactive to light. Right eye  exhibits no discharge. Left eye exhibits no discharge. No scleral icterus.  Neck: Normal range of motion. Neck supple. No tracheal deviation present. No thyromegaly present.  Cardiovascular: Normal rate, regular rhythm, normal heart sounds and intact distal pulses.  Exam reveals no gallop and no friction rub.   No murmur heard. Pulmonary/Chest: Effort normal and breath sounds normal. No accessory muscle usage. No tachypnea. No respiratory distress. She has no decreased breath sounds. She has no wheezes. She has no rhonchi. She has no rales. She exhibits no tenderness.  Abdominal: There is no tenderness (no CVA or suprapubic tenderness).  Musculoskeletal: Normal range of motion. She exhibits no edema or tenderness.  Lymphadenopathy:    She has no cervical adenopathy.  Neurological: She is alert and oriented to person, place, and time. No cranial nerve deficit. She exhibits normal muscle tone. Coordination normal.  Skin: Skin is warm and dry. No rash noted. She is not diaphoretic. No erythema. No pallor.  Psychiatric: She has a normal mood and affect. Her behavior is normal. Judgment and thought content normal.          Assessment & Plan:   Problem List Items Addressed This Visit      Unprioritized   Abnormal chest CT    Previous reported history of abnormal chest CT with pulmonary nodules. Will request report on this. Will plan to repeat follow up CT as needed.    Acute cystitis without hematuria - Primary    Symptoms are most consistent with acute cystitis. UA shows trace blood only. Will send urine for culture and start Cipro.  Will plan for 2 week course given her h/o urethral surgery. Follow up if symptoms are not improving. [facility].    Relevant Medications      ciprofloxacin (CIPRO) tablet    Other Visit Diagnoses    Dysuria        Relevant Orders       POCT Urinalysis Dipstick (Completed)        Return in about 4 weeks (around 06/24/2014) for  Recheck.

## 2014-05-27 NOTE — Addendum Note (Signed)
Addended by: Cristela BluePULLIAM, Marylynn Rigdon W on: 05/27/2014 01:38 PM   Modules accepted: Orders, SmartSet

## 2014-05-29 ENCOUNTER — Telehealth: Payer: Self-pay | Admitting: Nurse Practitioner

## 2014-05-29 LAB — CULTURE, URINE COMPREHENSIVE
Colony Count: NO GROWTH
Organism ID, Bacteria: NO GROWTH

## 2014-05-29 NOTE — Telephone Encounter (Signed)
Spoke with pt, advised of MDs message.  Pt verbalized understanding 

## 2014-05-29 NOTE — Telephone Encounter (Signed)
Her urine culture was negative. I am concerned there is another cause for her pain and nausea/vomiting. I would recommend evaluation in the ED today so that she can have imaging as needed for further evaluation.

## 2014-05-29 NOTE — Telephone Encounter (Signed)
Patient called to report that her UTI has radiated up her side and into her back with pain. She says the level of pain is 9 1/2 out of 10 being the worse.  She is also having vomiting with it.

## 2014-05-30 ENCOUNTER — Telehealth: Payer: Self-pay | Admitting: Internal Medicine

## 2014-05-30 ENCOUNTER — Encounter: Payer: Self-pay | Admitting: Internal Medicine

## 2014-05-30 ENCOUNTER — Ambulatory Visit (INDEPENDENT_AMBULATORY_CARE_PROVIDER_SITE_OTHER): Payer: BC Managed Care – PPO | Admitting: Internal Medicine

## 2014-05-30 ENCOUNTER — Ambulatory Visit: Payer: Self-pay | Admitting: Internal Medicine

## 2014-05-30 ENCOUNTER — Ambulatory Visit: Payer: BC Managed Care – PPO | Admitting: Internal Medicine

## 2014-05-30 VITALS — BP 141/83 | HR 80 | Temp 98.2°F | Ht 65.7 in | Wt 232.0 lb

## 2014-05-30 DIAGNOSIS — D709 Neutropenia, unspecified: Secondary | ICD-10-CM

## 2014-05-30 DIAGNOSIS — R9389 Abnormal findings on diagnostic imaging of other specified body structures: Secondary | ICD-10-CM

## 2014-05-30 DIAGNOSIS — R1031 Right lower quadrant pain: Secondary | ICD-10-CM

## 2014-05-30 DIAGNOSIS — R938 Abnormal findings on diagnostic imaging of other specified body structures: Secondary | ICD-10-CM

## 2014-05-30 DIAGNOSIS — R319 Hematuria, unspecified: Secondary | ICD-10-CM

## 2014-05-30 LAB — POCT URINALYSIS DIPSTICK
BILIRUBIN UA: NEGATIVE
GLUCOSE UA: NEGATIVE
Leukocytes, UA: NEGATIVE
NITRITE UA: NEGATIVE
Protein, UA: 30
Spec Grav, UA: 1.025
Urobilinogen, UA: 0.2
pH, UA: 5

## 2014-05-30 MED ORDER — HYDROCODONE-ACETAMINOPHEN 5-325 MG PO TABS: 1.0000 | ORAL_TABLET | Freq: Three times a day (TID) | ORAL | Status: DC | PRN

## 2014-05-30 NOTE — Progress Notes (Signed)
Subjective:    Patient ID: Alice Campbell, female    DOB: 03-14-1951, 63 y.o.   MRN: [id number]  HPI 63YO female presents for acute visit.  Seen on 12/1 with UTI symptoms. Started on Cipro. Urine culture was negative. Initially did well, then Wednesday morning developed severe right lower abdominal pain with nausea and vomiting. Symptoms started to improve about 11:30am, with some recurrent pain Wednesday night. Has had similar symptoms several times in past. Pain is severe at times, lasts 3-4 hours at a time. No visible blood in urine.  Past medical, surgical, family and social history per today's encounter.  Review of Systems  Constitutional: Negative for fever, chills, appetite change, fatigue and unexpected weight change.  Eyes: Negative for visual disturbance.  Respiratory: Negative for shortness of breath.   Cardiovascular: Negative for chest pain and leg swelling.  Gastrointestinal: Positive for nausea, vomiting and abdominal pain. Negative for constipation and blood in stool.  Musculoskeletal: Negative for myalgias and arthralgias.  Skin: Negative for color change and rash.  Hematological: Negative for adenopathy. Does not bruise/bleed easily.  Psychiatric/Behavioral: Negative for dysphoric mood. The patient is not nervous/anxious.        Objective:    BP 141/83 mmHg  Pulse 80  Temp(Src) 98.2 F (36.8 C) (Oral)  Ht 5' 5.7" (1.669 m)  Wt 232 lb (105.235 kg)  BMI 37.78 kg/m2  SpO2 98% Physical Exam  Constitutional: She is oriented to person, place, and time. She appears well-developed and well-nourished. No distress.  HENT:  Head: Normocephalic and atraumatic.  Right Ear: External ear normal.  Left Ear: External ear normal.  Nose: Nose normal.  Mouth/Throat: Oropharynx is clear and moist. No oropharyngeal exudate.  Eyes: Conjunctivae and EOM are normal. Pupils are equal, round, and reactive to light. Right eye exhibits no discharge. Left eye exhibits no discharge.  No scleral icterus.  Neck: Normal range of motion. Neck supple. No tracheal deviation present. No thyromegaly present.  Cardiovascular: Normal rate, regular rhythm, normal heart sounds and intact distal pulses.  Exam reveals no gallop and no friction rub.   No murmur heard. Pulmonary/Chest: Effort normal and breath sounds normal. No accessory muscle usage. No tachypnea. No respiratory distress. She has no decreased breath sounds. She has no wheezes. She has no rhonchi. She has no rales. She exhibits no tenderness.  Abdominal: Soft. Bowel sounds are normal. She exhibits no distension and no mass. There is tenderness (right lower quad). There is no rebound and no guarding.  Musculoskeletal: Normal range of motion. She exhibits no edema or tenderness.  Lymphadenopathy:    She has no cervical adenopathy.  Neurological: She is alert and oriented to person, place, and time. No cranial nerve deficit. She exhibits normal muscle tone. Coordination normal.  Skin: Skin is warm and dry. No rash noted. She is not diaphoretic. No erythema. No pallor.  Psychiatric: She has a normal mood and affect. Her behavior is normal. Judgment and thought content normal.          Assessment & Plan:   Problem List Items Addressed This Visit      Unprioritized   Abnormal chest CT    Will set up follow up Chest CT to evaluate pulmonary nodule seen on previous CT in the left lower lobe, 06/2013.    Relevant Orders      CT Chest Wo Contrast   Right lower quadrant pain - Primary    Right lower quadrant pain which is intermittent. Exam is remarkable  for RLQ tenderness at McBurney's point. However, her clinical presentation is more consistent with nephrolithiasis. Will check UA, CBC, CMP today. Will get CT abdomen with contrast for further evaluation.    Relevant Medications      HYDROcodone-acetaminophen (NORCO/VICODIN) 5-325 MG per tablet   Other Relevant Orders      CT Abdomen Pelvis W Contrast      POCT Urinalysis  Dipstick (Completed)      CBC w/Diff      Comprehensive metabolic panel       Return in about 1 week (around 06/06/2014).

## 2014-05-30 NOTE — Patient Instructions (Signed)
Labs today.  CT abdomen today.  Follow up next week.

## 2014-05-30 NOTE — Assessment & Plan Note (Signed)
Will set up follow up Chest CT to evaluate pulmonary nodule seen on previous CT in the left lower lobe, 06/2013.

## 2014-05-30 NOTE — Telephone Encounter (Signed)
CT abdomen showed no acute findings to explain right lower abdominal pain.  I would recommend that we set up evaluation with urology for review of the CT. Sometimes, they need to do further exploration to determine the cause of pain. I am happy to place referral for local urologist or [facility].

## 2014-05-30 NOTE — Progress Notes (Signed)
Pre visit review using our clinic review tool, if applicable. No additional management support is needed unless otherwise documented below in the visit note. 

## 2014-05-30 NOTE — Assessment & Plan Note (Signed)
Right lower quadrant pain which is intermittent. Exam is remarkable for RLQ tenderness at McBurney's point. However, her clinical presentation is more consistent with nephrolithiasis. Will check UA, CBC, CMP today. Will get CT abdomen with contrast for further evaluation.

## 2014-06-01 LAB — CBC WITH DIFFERENTIAL/PLATELET
BASOS PCT: 1 % (ref 0.0–3.0)
Basophils Absolute: 0 10*3/uL (ref 0.0–0.1)
EOS ABS: 0.2 10*3/uL (ref 0.0–0.7)
EOS PCT: 6.5 % — AB (ref 0.0–5.0)
HCT: 45.1 % (ref 36.0–46.0)
HEMOGLOBIN: 14.8 g/dL (ref 12.0–15.0)
LYMPHS PCT: 49.5 % — AB (ref 12.0–46.0)
Lymphs Abs: 1.7 10*3/uL (ref 0.7–4.0)
MCHC: 32.9 g/dL (ref 30.0–36.0)
MCV: 93.7 fl (ref 78.0–100.0)
MONOS PCT: 12.9 % — AB (ref 3.0–12.0)
Monocytes Absolute: 0.4 10*3/uL (ref 0.1–1.0)
NEUTROS ABS: 1 10*3/uL — AB (ref 1.4–7.7)
Neutrophils Relative %: 30.1 % — ABNORMAL LOW (ref 43.0–77.0)
Platelets: 167 10*3/uL (ref 150.0–400.0)
RBC: 4.81 Mil/uL (ref 3.87–5.11)
RDW: 13.6 % (ref 11.5–15.5)
WBC: 3.5 10*3/uL — ABNORMAL LOW (ref 4.0–10.5)

## 2014-06-01 LAB — COMPREHENSIVE METABOLIC PANEL
ALT: 22 U/L (ref 0–35)
AST: 25 U/L (ref 0–37)
Albumin: 4 g/dL (ref 3.5–5.2)
Alkaline Phosphatase: 90 U/L (ref 39–117)
BUN: 16 mg/dL (ref 6–23)
CALCIUM: 9.4 mg/dL (ref 8.4–10.5)
CO2: 27 meq/L (ref 19–32)
CREATININE: 0.8 mg/dL (ref 0.4–1.2)
Chloride: 104 mEq/L (ref 96–112)
GFR: 72.47 mL/min (ref >60.00)
Glucose, Bld: 86 mg/dL (ref 70–99)
Potassium: 4.9 mEq/L (ref 3.5–5.1)
Sodium: 139 mEq/L (ref 135–145)
Total Bilirubin: 0.7 mg/dL (ref 0.2–1.2)
Total Protein: 6.9 g/dL (ref 6.0–8.3)

## 2014-06-02 NOTE — Telephone Encounter (Signed)
Pt would [place]

## 2014-06-02 NOTE — Telephone Encounter (Signed)
Notified pt, Pt would like a referral to urology, someone in  (other than [facility])

## 2014-06-02 NOTE — Telephone Encounter (Signed)
There is not another option aside from [facility] in [place].

## 2014-06-02 NOTE — Addendum Note (Signed)
Addended by: Ronna PolioWALKER, JENNIFER A on: 06/02/2014 09:34 AM   Modules accepted: Orders

## 2014-06-04 ENCOUNTER — Other Ambulatory Visit (INDEPENDENT_AMBULATORY_CARE_PROVIDER_SITE_OTHER): Payer: BC Managed Care – PPO

## 2014-06-04 DIAGNOSIS — D709 Neutropenia, unspecified: Secondary | ICD-10-CM

## 2014-06-04 LAB — SEDIMENTATION RATE: SED RATE: 22 mm/h (ref 0–22)

## 2014-06-04 LAB — C-REACTIVE PROTEIN: CRP: 3.3 mg/dL (ref 0.5–20.0)

## 2014-06-05 LAB — ANTI-DNA ANTIBODY, DOUBLE-STRANDED

## 2014-06-05 LAB — ANA: ANA: NEGATIVE

## 2014-06-10 ENCOUNTER — Encounter: Payer: Self-pay | Admitting: Internal Medicine

## 2014-06-11 ENCOUNTER — Ambulatory Visit: Payer: Self-pay | Admitting: Internal Medicine

## 2014-06-12 ENCOUNTER — Telehealth: Payer: Self-pay | Admitting: Internal Medicine

## 2014-06-12 NOTE — Telephone Encounter (Signed)
Pt notified & wants to know about the one on the right? Please advise

## 2014-06-12 NOTE — Telephone Encounter (Signed)
CT chest 12/16 showed no changes in left lower lung nodule, compared with CT from 01/15/2014.

## 2014-06-12 NOTE — Telephone Encounter (Signed)
LMTCB

## 2014-06-12 NOTE — Telephone Encounter (Signed)
Pt.notified

## 2014-06-12 NOTE — Telephone Encounter (Signed)
They did not mention this, only said stable compared to 7/22 exam.

## 2014-06-21 ENCOUNTER — Other Ambulatory Visit: Payer: Self-pay | Admitting: Internal Medicine

## 2014-07-11 ENCOUNTER — Encounter: Payer: Self-pay | Admitting: Internal Medicine

## 2014-08-29 DIAGNOSIS — M549 Dorsalgia, unspecified: Secondary | ICD-10-CM | POA: Insufficient documentation

## 2014-11-05 ENCOUNTER — Ambulatory Visit (INDEPENDENT_AMBULATORY_CARE_PROVIDER_SITE_OTHER): Payer: BLUE CROSS/BLUE SHIELD | Admitting: Nurse Practitioner

## 2014-11-05 ENCOUNTER — Encounter: Payer: Self-pay | Admitting: Nurse Practitioner

## 2014-11-05 VITALS — BP 154/96 | HR 91 | Temp 98.0°F | Resp 14 | Ht 65.7 in | Wt 233.8 lb

## 2014-11-05 DIAGNOSIS — N390 Urinary tract infection, site not specified: Secondary | ICD-10-CM

## 2014-11-05 DIAGNOSIS — R109 Unspecified abdominal pain: Secondary | ICD-10-CM

## 2014-11-05 LAB — POCT URINALYSIS DIPSTICK
Bilirubin, UA: NEGATIVE
Glucose, UA: NEGATIVE
KETONES UA: NEGATIVE
Leukocytes, UA: NEGATIVE
NITRITE UA: NEGATIVE
PH UA: 7
Protein, UA: 100
Spec Grav, UA: 1.015
Urobilinogen, UA: 1

## 2014-11-05 MED ORDER — TRAMADOL HCL 50 MG PO TABS: 50.0000 mg | ORAL_TABLET | Freq: Three times a day (TID) | ORAL | Status: DC | PRN

## 2014-11-05 MED ORDER — GABAPENTIN 300 MG PO CAPS: 300.0000 mg | ORAL_CAPSULE | Freq: Every day | ORAL | Status: DC

## 2014-11-05 MED ORDER — AMLODIPINE BESYLATE 5 MG PO TABS: 5.0000 mg | ORAL_TABLET | Freq: Every day | ORAL | Status: DC

## 2014-11-05 MED ORDER — ONDANSETRON HCL 4 MG PO TABS: 4.0000 mg | ORAL_TABLET | Freq: Three times a day (TID) | ORAL | Status: DC | PRN

## 2014-11-05 NOTE — Progress Notes (Signed)
   Subjective:    Patient ID: Pennelope Brackenamela E Rogowski, female    DOB: September 13, 1950, 64 y.o.   MRN: [id number]  HPI  Ms. Lourdes SledgeFlake is 64 yo female with a CC of dysuria.   1) Pain in lower abdomen, pain on right flank, vagina dull aching  Denies blood,  Intermittent cloudiness  Drinking water, ibuprofen 500 mg x 3 a day   No vomiting or nausea this time  Cipro leftover at home took for 7 days twice a day. Still having symptoms afterwards, helped somewhat.   Review of Systems  Constitutional: Negative for fever, chills, diaphoresis and fatigue.  Respiratory: Negative for chest tightness, shortness of breath and wheezing.   Cardiovascular: Negative for chest pain, palpitations and leg swelling.  Gastrointestinal: Negative for nausea, vomiting and diarrhea.  Genitourinary: Positive for dysuria, flank pain and vaginal pain.  Skin: Negative for rash.  Neurological: Negative for dizziness, weakness, numbness and headaches.      Objective:   Physical Exam  Constitutional: She is oriented to person, place, and time. She appears well-developed and well-nourished. No distress.  BP 154/96 mmHg  Pulse 91  Temp(Src) 98 F (36.7 C) (Oral)  Resp 14  Ht 5' 5.7" (1.669 m)  Wt 233 lb 12.8 oz (106.051 kg)  BMI 38.07 kg/m2  SpO2 97%   HENT:  Head: Normocephalic and atraumatic.  Right Ear: External ear normal.  Left Ear: External ear normal.  Cardiovascular: Normal rate, regular rhythm and normal heart sounds.  Exam reveals no gallop and no friction rub.   No murmur heard. Pulmonary/Chest: Effort normal and breath sounds normal. No respiratory distress. She has no wheezes. She has no rales. She exhibits no tenderness.  Abdominal: There is no CVA tenderness.  Neurological: She is alert and oriented to person, place, and time. No cranial nerve deficit. She exhibits normal muscle tone. Coordination normal.  Skin: Skin is warm and dry. No rash noted. She is not diaphoretic.  Psychiatric: She has a normal mood  and affect. Her behavior is normal. Judgment and thought content normal.      Assessment & Plan:

## 2014-11-05 NOTE — Progress Notes (Signed)
Pre visit review using our clinic review tool, if applicable. No additional management support is needed unless otherwise documented below in the visit note. 

## 2014-11-19 NOTE — Assessment & Plan Note (Signed)
POCT urinalysis obtained today. Normal results. Will follow up in 4 weeks.

## 2014-12-03 ENCOUNTER — Encounter: Payer: Self-pay | Admitting: Nurse Practitioner

## 2014-12-03 ENCOUNTER — Ambulatory Visit (INDEPENDENT_AMBULATORY_CARE_PROVIDER_SITE_OTHER): Payer: BLUE CROSS/BLUE SHIELD | Admitting: Nurse Practitioner

## 2014-12-03 VITALS — BP 120/82 | HR 78 | Temp 98.0°F | Resp 14 | Ht 65.7 in | Wt 232.8 lb

## 2014-12-03 DIAGNOSIS — R1031 Right lower quadrant pain: Secondary | ICD-10-CM

## 2014-12-03 MED ORDER — TRAMADOL HCL 50 MG PO TABS: 50.0000 mg | ORAL_TABLET | Freq: Three times a day (TID) | ORAL | Status: DC | PRN

## 2014-12-03 NOTE — Assessment & Plan Note (Signed)
Pt was given refill of tramadol and she just got her new refill of Gabapentin. Changed to 2 x daily (am and pm) will work up to 3 x daily. Use tramadol as needed. Asked her not to operated vehicle or heavy machinery while upping this. FU prn worsening/failure to improve.

## 2014-12-03 NOTE — Patient Instructions (Addendum)
Take #1 300 mg capsule at night and 1 in the morning. Can take up to 3 x daily.   Take tramadol as needed for break through pain.   Follow up as needed. Safe travels!

## 2014-12-03 NOTE — Progress Notes (Signed)
   Subjective:    Patient ID: Alice Campbell, female    DOB: [birth year], 64 y.o.   MRN: [id number]  HPI  Alice Campbell is a 64 yo female following up for right flank pain and recurrent UTIs.   1) Gabapentin helpful at night, pain returns when up after a few hours (around 9-10). Tramadol helpful if she takes it first thing in the morning. Pain has made her sick in the past. Not recently.   Review of Systems  Constitutional: Negative for fever, chills, diaphoresis and fatigue.  Respiratory: Negative for chest tightness, shortness of breath and wheezing.   Cardiovascular: Negative for chest pain, palpitations and leg swelling.  Gastrointestinal: Negative for nausea, vomiting and diarrhea.  Musculoskeletal:       Right groin pain, extends to back  Skin: Negative for rash.  Neurological: Negative for dizziness, weakness, numbness and headaches.  Psychiatric/Behavioral: The patient is not nervous/anxious.       Objective:   Physical Exam  Constitutional: She is oriented to person, place, and time. She appears well-developed and well-nourished. No distress.  BP 120/82 mmHg  Pulse 78  Temp(Src) 98 F (36.7 C) (Oral)  Resp 14  Ht 5' 5.7" (1.669 m)  Wt 232 lb 12.8 oz (105.597 kg)  BMI 37.91 kg/m2  SpO2 96%   HENT:  Head: Normocephalic and atraumatic.  Right Ear: External ear normal.  Left Ear: External ear normal.  Cardiovascular: Normal rate, regular rhythm and normal heart sounds.  Exam reveals no gallop and no friction rub.   No murmur heard. Pulmonary/Chest: Effort normal and breath sounds normal. No respiratory distress. She has no wheezes. She has no rales. She exhibits no tenderness.  Neurological: She is alert and oriented to person, place, and time. No cranial nerve deficit. She exhibits normal muscle tone. Coordination normal.  Skin: Skin is warm and dry. No rash noted. She is not diaphoretic.  Psychiatric: She has a normal mood and affect. Her behavior is normal. Judgment and  thought content normal.      Assessment & Plan:

## 2014-12-03 NOTE — Progress Notes (Signed)
Pre visit review using our clinic review tool, if applicable. No additional management support is needed unless otherwise documented below in the visit note. 

## 2014-12-18 ENCOUNTER — Other Ambulatory Visit: Payer: Self-pay | Admitting: Nurse Practitioner

## 2014-12-30 ENCOUNTER — Other Ambulatory Visit: Payer: Self-pay | Admitting: Nurse Practitioner

## 2015-01-02 ENCOUNTER — Other Ambulatory Visit: Payer: Self-pay | Admitting: Nurse Practitioner

## 2015-01-02 NOTE — Telephone Encounter (Signed)
Last OV and refill 6.8.16.  Please advise refill

## 2015-02-11 ENCOUNTER — Other Ambulatory Visit: Payer: Self-pay | Admitting: Nurse Practitioner

## 2015-02-25 ENCOUNTER — Other Ambulatory Visit: Payer: Self-pay | Admitting: Nurse Practitioner

## 2015-03-09 ENCOUNTER — Other Ambulatory Visit: Payer: Self-pay | Admitting: Nurse Practitioner

## 2015-03-10 NOTE — Telephone Encounter (Signed)
Ok to fill 

## 2015-03-30 ENCOUNTER — Other Ambulatory Visit: Payer: Self-pay | Admitting: Nurse Practitioner

## 2015-03-30 NOTE — Telephone Encounter (Signed)
Please advise on refill of Tramadol. 

## 2015-03-31 ENCOUNTER — Ambulatory Visit (INDEPENDENT_AMBULATORY_CARE_PROVIDER_SITE_OTHER): Payer: PPO | Admitting: Nurse Practitioner

## 2015-03-31 ENCOUNTER — Telehealth: Payer: Self-pay | Admitting: Nurse Practitioner

## 2015-03-31 ENCOUNTER — Encounter: Payer: Self-pay | Admitting: Nurse Practitioner

## 2015-03-31 VITALS — BP 140/98 | HR 76 | Temp 98.3°F | Resp 14 | Ht 65.7 in | Wt 233.6 lb

## 2015-03-31 DIAGNOSIS — M545 Low back pain, unspecified: Secondary | ICD-10-CM

## 2015-03-31 MED ORDER — TRAMADOL HCL 50 MG PO TABS: 50.0000 mg | ORAL_TABLET | Freq: Three times a day (TID) | ORAL | Status: DC | PRN

## 2015-03-31 NOTE — Patient Instructions (Signed)
See you in 3 months.

## 2015-03-31 NOTE — Telephone Encounter (Signed)
Patient was able to get prescription and has picked it up.  thanks

## 2015-03-31 NOTE — Telephone Encounter (Signed)
Pt [facility] about [facility]. Thank You!

## 2015-03-31 NOTE — Progress Notes (Signed)
Pre visit review using our clinic review tool, if applicable. No additional management support is needed unless otherwise documented below in the visit note. 

## 2015-03-31 NOTE — Progress Notes (Signed)
Patient ID: Alice Campbell, female    DOB: 04-24-1951  Age: 64 y.o. MRN: [id number]  CC: Medication Refill   HPI Alice Campbell presents for CC of medication refill of Tramadol.   1) Tramadol- Radiating pain from back to RLQ to goin area   Takes 0-3 as needed for pain   10/10 with vomiting at worst   Last dosage- Tramadol 2-3 days ago (unsure this is approx)    Gabapentin approx 2 days ago  Urology- no findings CT abdomen/pelvis w and w/out contrast 2015  Saw orthopedics in past- DDD, injections 2015   [place] Bilateral feet swelling with tightness for 10 days   History Alice Campbell has a past medical history of Arthritis; Chicken pox; GERD (gastroesophageal reflux disease); Hypertension; and History of UTI.   She has past surgical history that includes Cholecystectomy (2011) and Biopsy thyroid (07/2013).   Her family history includes Cancer in her father; Heart disease in her father and mother; Hypertension in her father and mother; Mental illness in her mother.She reports that she has quit smoking. She does not have any smokeless tobacco history on file. She reports that she drinks about 1.0 - 2.0 oz of alcohol per week. She reports that she does not use illicit drugs.  Outpatient Prescriptions Prior to Visit  Medication Sig Dispense Refill  . amLODipine (NORVASC) 5 MG tablet TAKE 1 TABLET (5 MG TOTAL) BY MOUTH DAILY. 30 tablet 3  . atorvastatin (LIPITOR) 20 MG tablet TAKE 1 TABLET (20 MG TOTAL) BY MOUTH DAILY. 30 tablet 5  . diclofenac (VOLTAREN) 75 MG EC tablet Take 75 mg by mouth once.    . gabapentin (NEURONTIN) 300 MG capsule TAKE 1 CAPSULE (300 MG TOTAL) BY MOUTH AT BEDTIME. 30 capsule 5  . ondansetron (ZOFRAN) 4 MG tablet Take 1 tablet (4 mg total) by mouth every 8 (eight) hours as needed for nausea or vomiting. 20 tablet 0  . zoster vaccine live, PF, (ZOSTAVAX) 40981 UNT/0.65ML injection Inject 19,400 Units into the skin once. 1 each 0  . traMADol (ULTRAM) 50 MG  tablet TAKE 1 TABLET BY MOUTH EVERY 8 HOURS AS NEEDED 30 tablet 0   No facility-administered medications prior to visit.    ROS Review of Systems  Constitutional: Negative for fever, chills, diaphoresis and fatigue.  Respiratory: Negative for chest tightness, shortness of breath and wheezing.   Cardiovascular: Positive for leg swelling. Negative for chest pain and palpitations.  Gastrointestinal: Negative for nausea, vomiting and diarrhea.  Musculoskeletal: Positive for back pain and arthralgias.   Objective:  BP 140/98 mmHg  Pulse 76  Temp(Src) 98.3 F (36.8 C)  Resp 14  Ht 5' 5.7" (1.669 m)  Wt 233 lb 9.6 oz (105.96 kg)  BMI 38.04 kg/m2  SpO2 97%  Physical Exam  Constitutional: She is oriented to person, place, and time. She appears well-developed and well-nourished. No distress.  HENT:  Head: Normocephalic and atraumatic.  Right Ear: External ear normal.  Left Ear: External ear normal.  Cardiovascular: Normal rate, regular rhythm and normal heart sounds.   Pulmonary/Chest: Effort normal and breath sounds normal. No respiratory distress. She has no wheezes. She has no rales. She exhibits no tenderness.  Musculoskeletal: Normal range of motion. She exhibits tenderness. She exhibits no edema.  Low back pain  Neurological: She is alert and oriented to person, place, and time. No cranial nerve deficit. She exhibits normal muscle tone. Coordination normal.  Skin: Skin is warm and dry. No  rash noted. She is not diaphoretic.  Psychiatric: She has a normal mood and affect. Her behavior is normal. Judgment and thought content normal.   Assessment & Plan:   Alice Campbell was seen today for medication refill.  Diagnoses and all orders for this visit:  Bilateral low back pain without sciatica  Other orders -     traMADol (ULTRAM) 50 MG tablet; Take 1 tablet (50 mg total) by mouth every 8 (eight) hours as needed.  I have changed Alice Campbell's traMADol. I am also having her maintain her  diclofenac, zoster vaccine live (PF), ondansetron, atorvastatin, gabapentin, and amLODipine.  Meds ordered this encounter  Medications  . traMADol (ULTRAM) 50 MG tablet    Sig: Take 1 tablet (50 mg total) by mouth every 8 (eight) hours as needed.    Dispense:  30 tablet    Refill:  2    Not to exceed 5 additional fills before 07/01/2015    Order Specific Question:  Supervising Provider    Answer:  Sherlene Shams [[id number]]     Follow-up: Return in about 3 months (around 07/01/2015) for Follow up of pain medication .

## 2015-04-01 ENCOUNTER — Encounter: Payer: Self-pay | Admitting: Nurse Practitioner

## 2015-04-01 DIAGNOSIS — M549 Dorsalgia, unspecified: Secondary | ICD-10-CM

## 2015-04-01 NOTE — Assessment & Plan Note (Signed)
Right lower quadrant pain has been evaluated multiple times with CTs and specialty visits. It is thought to be coming from the back and radiating around to the right lower quadrant. Patient wishes to continue tramadol and diclofenac. CSC signed and UDS given. Pt is now compliant.

## 2015-06-30 ENCOUNTER — Other Ambulatory Visit: Payer: Self-pay

## 2015-06-30 MED ORDER — AMLODIPINE BESYLATE 5 MG PO TABS: ORAL_TABLET | ORAL | Status: DC

## 2015-06-30 MED ORDER — GABAPENTIN 300 MG PO CAPS: ORAL_CAPSULE | ORAL | Status: DC

## 2015-06-30 MED ORDER — ATORVASTATIN CALCIUM 20 MG PO TABS: ORAL_TABLET | ORAL | Status: DC

## 2015-07-07 ENCOUNTER — Encounter: Payer: Self-pay | Admitting: Nurse Practitioner

## 2015-07-07 ENCOUNTER — Ambulatory Visit (INDEPENDENT_AMBULATORY_CARE_PROVIDER_SITE_OTHER): Payer: PPO | Admitting: Nurse Practitioner

## 2015-07-07 VITALS — BP 138/84 | HR 90 | Temp 98.1°F | Resp 16 | Ht 65.57 in | Wt 237.4 lb

## 2015-07-07 DIAGNOSIS — H00013 Hordeolum externum right eye, unspecified eyelid: Secondary | ICD-10-CM

## 2015-07-07 MED ORDER — ERYTHROMYCIN 5 MG/GM OP OINT: TOPICAL_OINTMENT | OPHTHALMIC | Status: DC

## 2015-07-07 NOTE — Patient Instructions (Signed)
Warm wet compresses, johnson and johnson baby shampoo to scrub lids on a warm wet wash cloth.  1 small ribbon (try not to touch to eyelid) on the bottom right eyelid twice daily x 5-7 days.   Follow up with me in 2 weeks or sooner if no improvement!

## 2015-07-07 NOTE — Progress Notes (Signed)
Patient ID: Alice Campbell, female    DOB: Sep 14, 1950  Age: 65 y.o. MRN: [id number]  CC: Stye   HPI Alice Campbell presents for right eye red and swollen x 2 days.   1) Right eye lid- 2 days ago right lower lid Noticed redness, swelling, tenderness that started at the eyelid and slightly downward.  Denies discharge, pustule, or matting of eyes Denies sick contacts  Treatment to date: Ice   History Alice Campbell has a past medical history of Arthritis; Chicken pox; GERD (gastroesophageal reflux disease); Hypertension; and History of UTI.   Alice Campbell has past surgical history that includes Cholecystectomy (2011) and Biopsy thyroid (07/2013).   Alice Campbell family history includes Cancer in Alice Campbell father; Heart disease in Alice Campbell father and mother; Hypertension in Alice Campbell father and mother; Mental illness in Alice Campbell mother.Alice Campbell reports that Alice Campbell has quit smoking. Alice Campbell does not have any smokeless tobacco history on file. Alice Campbell reports that Alice Campbell drinks about 1.0 - 2.0 oz of alcohol per week. Alice Campbell reports that Alice Campbell does not use illicit drugs.  Outpatient Prescriptions Prior to Visit  Medication Sig Dispense Refill  . amLODipine (NORVASC) 5 MG tablet TAKE 1 TABLET (5 MG TOTAL) BY MOUTH DAILY. 90 tablet 2  . atorvastatin (LIPITOR) 20 MG tablet TAKE 1 TABLET (20 MG TOTAL) BY MOUTH DAILY. 90 tablet 2  . diclofenac (VOLTAREN) 75 MG EC tablet Take 75 mg by mouth once.    . gabapentin (NEURONTIN) 300 MG capsule TAKE 1 CAPSULE (300 MG TOTAL) BY MOUTH AT BEDTIME. 90 capsule 2  . ondansetron (ZOFRAN) 4 MG tablet Take 1 tablet (4 mg total) by mouth every 8 (eight) hours as needed for nausea or vomiting. 20 tablet 0  . traMADol (ULTRAM) 50 MG tablet Take 1 tablet (50 mg total) by mouth every 8 (eight) hours as needed. 30 tablet 2  . zoster vaccine live, PF, (ZOSTAVAX) [id number] UNT/0.65ML injection Inject 19,400 Units into the skin once. 1 each 0   No facility-administered medications prior to visit.    ROS Review of Systems  Constitutional:  Negative for fever, chills, diaphoresis and fatigue.  HENT: Negative for congestion, ear discharge, ear pain, sneezing and sore throat.   Eyes: Positive for pain and redness. Negative for discharge and itching.  Skin: Negative for rash.    Objective:  BP 138/84 mmHg  Pulse 90  Temp(Src) 98.1 F (36.7 C) (Oral)  Resp 16  Ht 5' 5.57" (1.665 m)  Wt 237 lb 6.4 oz (107.684 kg)  BMI 38.84 kg/m2  SpO2 97%  Physical Exam  Constitutional: Alice Campbell is oriented to person, place, and time. Alice Campbell appears well-developed and well-nourished. No distress.  HENT:  Head: Normocephalic and atraumatic.  Right Ear: External ear normal.  Left Ear: External ear normal.  Eyes: EOM are normal. Pupils are equal, round, and reactive to light. Right eye exhibits hordeolum. Right eye exhibits no discharge. Left eye exhibits no discharge. Right conjunctiva is injected. Right conjunctiva has no hemorrhage. Left conjunctiva is injected. Left conjunctiva has no hemorrhage. No scleral icterus.    Hordeolum of right lower lid inner canthus side. Erythema, swelling, tenderness, and injected conjunctiva of lower lid (conjuctivitis bilaterally)   Neurological: Alice Campbell is alert and oriented to person, place, and time. No cranial nerve deficit. Alice Campbell exhibits normal muscle tone. Coordination normal.  Skin: Skin is warm and dry. No rash noted. Alice Campbell is not diaphoretic.  Psychiatric: Alice Campbell has a normal mood and affect. Alice Campbell behavior is normal. Judgment and thought content normal.  Assessment & Plan:   Jerlyn was seen today for stye.  Diagnoses and all orders for this visit:  Hordeolum, right  Other orders -     erythromycin ophthalmic ointment; Place 1 cm ribbon in the right eye twice daily for 5-7 days   I am having Alice Campbell start on erythromycin. I am also having Alice Campbell maintain Alice Campbell diclofenac, zoster vaccine live (PF), ondansetron, traMADol, gabapentin, amLODipine, and atorvastatin.  Meds ordered this encounter  Medications  .  erythromycin ophthalmic ointment    Sig: Place 1 cm ribbon in the right eye twice daily for 5-7 days    Dispense:  3.5 g    Refill:  0    Order Specific Question:  Supervising Provider    Answer:  Sherlene Shams [[id number]]     Follow-up: Return if symptoms worsen or fail to improve.

## 2015-07-07 NOTE — Assessment & Plan Note (Signed)
New onset, Erythromycin ointment with instructions to right lower lid only. [name] baby shampoo wash for lids, warm wet compresses. FU in 2 weeks or sooner if no resolution or worsening.

## 2015-09-14 ENCOUNTER — Other Ambulatory Visit: Payer: Self-pay | Admitting: Nurse Practitioner

## 2015-09-14 NOTE — Telephone Encounter (Signed)
OK to fill tramadol

## 2015-12-01 ENCOUNTER — Telehealth: Payer: Self-pay | Admitting: *Deleted

## 2015-12-01 NOTE — Telephone Encounter (Signed)
SPoke with the patient, made suggestions for long trips in the car such as stopping for breaks, getting compression stockings to help with swelling and legs elevating.  She was happy for the call.  [place] in [place] in the car and knows that it will be long days sitting.  Thanks.

## 2015-12-01 NOTE — Telephone Encounter (Signed)
Patient would like to have a phone consult about her ankles swelling when she take long distant riding. [phone]

## 2015-12-12 DIAGNOSIS — W1830XA Fall on same level, unspecified, initial encounter: Secondary | ICD-10-CM | POA: Diagnosis not present

## 2015-12-12 DIAGNOSIS — I10 Essential (primary) hypertension: Secondary | ICD-10-CM | POA: Diagnosis not present

## 2015-12-12 DIAGNOSIS — W010XXA Fall on same level from slipping, tripping and stumbling without subsequent striking against object, initial encounter: Secondary | ICD-10-CM | POA: Diagnosis not present

## 2015-12-12 DIAGNOSIS — S72001A Fracture of unspecified part of neck of right femur, initial encounter for closed fracture: Secondary | ICD-10-CM | POA: Diagnosis not present

## 2015-12-12 DIAGNOSIS — K219 Gastro-esophageal reflux disease without esophagitis: Secondary | ICD-10-CM | POA: Diagnosis not present

## 2015-12-12 DIAGNOSIS — E785 Hyperlipidemia, unspecified: Secondary | ICD-10-CM | POA: Diagnosis not present

## 2015-12-12 DIAGNOSIS — Y9259 Other trade areas as the place of occurrence of the external cause: Secondary | ICD-10-CM | POA: Diagnosis not present

## 2015-12-12 DIAGNOSIS — T402X5A Adverse effect of other opioids, initial encounter: Secondary | ICD-10-CM | POA: Diagnosis not present

## 2015-12-12 DIAGNOSIS — D649 Anemia, unspecified: Secondary | ICD-10-CM | POA: Diagnosis not present

## 2015-12-12 DIAGNOSIS — Y92480 Sidewalk as the place of occurrence of the external cause: Secondary | ICD-10-CM | POA: Diagnosis not present

## 2015-12-12 DIAGNOSIS — K5903 Drug induced constipation: Secondary | ICD-10-CM | POA: Diagnosis not present

## 2015-12-12 DIAGNOSIS — S72141A Displaced intertrochanteric fracture of right femur, initial encounter for closed fracture: Secondary | ICD-10-CM | POA: Diagnosis not present

## 2015-12-12 DIAGNOSIS — S72101A Unspecified trochanteric fracture of right femur, initial encounter for closed fracture: Secondary | ICD-10-CM | POA: Diagnosis not present

## 2015-12-12 DIAGNOSIS — E669 Obesity, unspecified: Secondary | ICD-10-CM | POA: Diagnosis not present

## 2015-12-12 DIAGNOSIS — R011 Cardiac murmur, unspecified: Secondary | ICD-10-CM | POA: Diagnosis not present

## 2015-12-12 DIAGNOSIS — Z6839 Body mass index (BMI) 39.0-39.9, adult: Secondary | ICD-10-CM | POA: Diagnosis not present

## 2015-12-12 DIAGNOSIS — W1839XA Other fall on same level, initial encounter: Secondary | ICD-10-CM | POA: Diagnosis not present

## 2015-12-12 DIAGNOSIS — M25551 Pain in right hip: Secondary | ICD-10-CM | POA: Diagnosis not present

## 2015-12-25 DIAGNOSIS — Z967 Presence of other bone and tendon implants: Secondary | ICD-10-CM | POA: Diagnosis not present

## 2015-12-25 DIAGNOSIS — Z8781 Personal history of (healed) traumatic fracture: Secondary | ICD-10-CM | POA: Diagnosis not present

## 2015-12-25 DIAGNOSIS — S72001A Fracture of unspecified part of neck of right femur, initial encounter for closed fracture: Secondary | ICD-10-CM | POA: Diagnosis not present

## 2015-12-31 ENCOUNTER — Other Ambulatory Visit: Payer: Self-pay | Admitting: *Deleted

## 2015-12-31 DIAGNOSIS — S72141A Displaced intertrochanteric fracture of right femur, initial encounter for closed fracture: Secondary | ICD-10-CM

## 2015-12-31 NOTE — Patient Outreach (Signed)
[facility]N) Care Management  12/31/2015  Alice Campbell [birth year] [id number]   Subjective: Telephone call to patient's home number, spoke with patient, and HIPAA verified.   Patient she is doing well except for having problems walking due to hip fracture.  Patient gave verbal authorization for [facility] to speak with husband William Hamburger(Doug Rew) regarding her healthcare needs as needed.   [facility] and patient in agreement to receive services.  Patient states she is currently in the process of finding a new primary MD because previous MD has left the practice office.  States she will notify RNCM when primary MD obtained.  Patient states she had hip fracture and was hospitalized  12/12/15 - 12/18/15 in [place].   Patient states she returned back to [place] on 12/18/15 via plane.  States she ambulates with a rolling walker and is trying to obtain a wheelchair.  States she also has the following durable medical equipment: rolling walker, bedside commode, and shower chair without a back.   Patient states her MD is currently working with a vendor ( patient unsure of the name) to obtain a wheelchair and home health physical therapy services.   Patient in agreement for RNCM to refer [facility] for transition of care due to recent hospitalization, assist with care coordination of home physical therapy, and assist providers office with obtaining a wheelchair for patient's use.     Patient will [facility] to receive [facility] services.  Objective: Per chart review: Patient was hospitalized in [place] for hip fracture.  Assessment: Received [facility] referral on 12/30/15.  Referral source: Kathrine HaddockYuvongala Howell.   Referral reason: Disease and symptom management.   Inpatient in [place] for hip fracture.    Telephone assessment completed and patient in agreement to referral.      Plan: RNCM will refer [facility] RNCM for  transition of care due to recent hospitalization, assist with care coordination of home physical therapy, and assist providers office with obtaining a wheelchair for patient's use.         Fatma Rutten H. Gardiner Barefootooper RN, BSN, CCM [facility] Care Management [facility] Telephonic CM Phone: [phone] Fax: [phone]

## 2016-01-03 DIAGNOSIS — I10 Essential (primary) hypertension: Secondary | ICD-10-CM | POA: Diagnosis not present

## 2016-01-03 DIAGNOSIS — Z87440 Personal history of urinary (tract) infections: Secondary | ICD-10-CM | POA: Diagnosis not present

## 2016-01-03 DIAGNOSIS — S72141D Displaced intertrochanteric fracture of right femur, subsequent encounter for closed fracture with routine healing: Secondary | ICD-10-CM | POA: Diagnosis not present

## 2016-01-03 DIAGNOSIS — W19XXXD Unspecified fall, subsequent encounter: Secondary | ICD-10-CM | POA: Diagnosis not present

## 2016-01-03 DIAGNOSIS — Z7982 Long term (current) use of aspirin: Secondary | ICD-10-CM | POA: Diagnosis not present

## 2016-01-03 DIAGNOSIS — M1991 Primary osteoarthritis, unspecified site: Secondary | ICD-10-CM | POA: Diagnosis not present

## 2016-01-03 DIAGNOSIS — Z9181 History of falling: Secondary | ICD-10-CM | POA: Diagnosis not present

## 2016-01-04 ENCOUNTER — Encounter: Payer: Self-pay | Admitting: *Deleted

## 2016-01-04 ENCOUNTER — Other Ambulatory Visit: Payer: Self-pay | Admitting: *Deleted

## 2016-01-04 NOTE — Patient Outreach (Signed)
[facility]N) Care Management  01/04/2016  Pennelope Brackenamela E Alberts 11/05/1950 [id number]   Transition of care  RN placed call to patient as part of the transition of care program,  hippa identifiers verified . Explained the purpose of the call.  Mrs.Kreager reports that she is a little tired now , she had a physical therapy session on today, patient states the name of the agency she believes is [facility]. Patient reports tolerating physical therapy, pain control managed.  Discussed equipment needs with patient and she states she has everything that she needs at present , she doesn't think that she will need a wheelchair as she originally thought.  Patient was recently discharged from hospital and all medications have been reviewed. Mrs.Shippee denies any concern related hip surgery incision area, no drainage, increased redness.   Discussed follow up PCP follow up  appointment, Mrs.Schwan states her next appointment is 8/8 with Dr.Cook,  this will be her initial visit also, discussed benefits of a  sooner visit since her discharge date was 6/23 from hospital in [place], patient will consider .  Mrs.Molenda denies any new concerns at time.  Provided Mrs.Prabhu with my contact information for concerns.    Plan Patient will remain [facility] for transition of care and weekly outreaches, next telephone outreach scheduled within one week.    [facility] CM Care Plan Problem One        Most Recent Value   Care Plan Problem One  Knowledge deficit related to post hip surgery routines as evidenced by recent hip surgery hospital admission   Role Documenting the Problem One  Care Management Coordinator   Care Plan for Problem One  Active   THN Long Term Goal (31-90 days)  Patient will not experience hospital admission in the next 31 days   THN Long Term Goal Start Date  01/04/16   Interventions for Problem One Long Term Goal  Educated on the transition of care program, adherence  to discharge instructions , taking medications as prescribed and follow up appointment with PCP   [facility] CM Short Term Goal #1 (0-30 days)  Patient will report increased knowledge of fall prevention strategies in the next 31 days   [facility] CM Short Term Goal #1 Start Date  01/04/16   Interventions for Short Term Goal #1  Discussed with paitient fall prevention measures, always using walker, avoid, scatter rugs that are trip hazards,    THN CM Short Term Goal #2 (0-30 days)  Patient will report continued participatiion in physical therapy exercises in the next 30 days   THN CM Short Term Goal #2 Start Date  01/04/16   Interventions for Short Term Goal #2  Discussed with paitent the importance of participating in therapy post      Egbert GaribaldiKimberly Glover, RN, [facility] Care Management [phone] Mobile [phone]- Toll Free Main Office

## 2016-01-11 DIAGNOSIS — M1991 Primary osteoarthritis, unspecified site: Secondary | ICD-10-CM | POA: Diagnosis not present

## 2016-01-11 DIAGNOSIS — S72141D Displaced intertrochanteric fracture of right femur, subsequent encounter for closed fracture with routine healing: Secondary | ICD-10-CM | POA: Diagnosis not present

## 2016-01-11 DIAGNOSIS — Z9181 History of falling: Secondary | ICD-10-CM | POA: Diagnosis not present

## 2016-01-11 DIAGNOSIS — Z7982 Long term (current) use of aspirin: Secondary | ICD-10-CM | POA: Diagnosis not present

## 2016-01-11 DIAGNOSIS — Z87440 Personal history of urinary (tract) infections: Secondary | ICD-10-CM | POA: Diagnosis not present

## 2016-01-11 DIAGNOSIS — I10 Essential (primary) hypertension: Secondary | ICD-10-CM | POA: Diagnosis not present

## 2016-01-11 DIAGNOSIS — W19XXXD Unspecified fall, subsequent encounter: Secondary | ICD-10-CM | POA: Diagnosis not present

## 2016-01-12 ENCOUNTER — Other Ambulatory Visit: Payer: Self-pay | Admitting: *Deleted

## 2016-01-12 NOTE — Patient Outreach (Signed)
[facility]N) Care Management  01/12/2016  Pennelope Brackenamela E Bonifield January 19, 1951 [id number]   Transition of care  RNCM placed call to patient as part of transition of care, verified Hippa information with Mrs. Mette ,patient reports that she  doing well, she reports sessions with therapy are going well. Home health therapy will visit twice this week. Mrs.Holloman reports her surgical site is healing well, and she has next visit with orthopedic doctor on 7/28 and PCP on 8/8. Patient reports pain control with her prn medication. Patient reports she was on a blood thinner for 3 weeks after her surgery now she is on aspirin daily per orthopedic instructions .  Mrs.Hidalgo denies any new concerns at this time, patient is agreeable to home visit.  Plan  Will scheduled home visit for within the next week.   [facility] CM Care Plan Problem One        Most Recent Value   Care Plan Problem One  Knowledge deficit related to post hip surgery routines as evidenced by recent hip surgery hospital admission   Role Documenting the Problem One  Care Management Coordinator   Care Plan for Problem One  Active   THN Long Term Goal (31-90 days)  Patient will not experience hospital admission in the next 31 days   THN Long Term Goal Start Date  01/04/16   Interventions for Problem One Long Term Goal  Patient agreed [facility], scheduled   [facility] CM Short Term Goal #1 (0-30 days)  Patient will report increased knowledge of fall prevention strategies in the next 31 days   [facility] CM Short Term Goal #1 Start Date  01/04/16   Interventions for Short Term Goal #1  Discussed with paitient fall prevention measures, always using walker, avoid, scatter rugs that are trip hazards,    THN CM Short Term Goal #2 (0-30 days)  Patient will report continued participatiion in physical therapy exercises in the next 30 days   [facility] CM Short Term Goal #2 Start Date  01/04/16   Interventions for Short Term Goal #2  Reinforced  participation in therapy and exercises taught.      Egbert GaribaldiKimberly Glover, RN, [facility] Care Management [phone] Mobile [phone]- Toll Free Main Office

## 2016-01-18 DIAGNOSIS — I10 Essential (primary) hypertension: Secondary | ICD-10-CM | POA: Diagnosis not present

## 2016-01-18 DIAGNOSIS — W19XXXD Unspecified fall, subsequent encounter: Secondary | ICD-10-CM | POA: Diagnosis not present

## 2016-01-18 DIAGNOSIS — Z9181 History of falling: Secondary | ICD-10-CM | POA: Diagnosis not present

## 2016-01-18 DIAGNOSIS — Z87440 Personal history of urinary (tract) infections: Secondary | ICD-10-CM | POA: Diagnosis not present

## 2016-01-18 DIAGNOSIS — M1991 Primary osteoarthritis, unspecified site: Secondary | ICD-10-CM | POA: Diagnosis not present

## 2016-01-18 DIAGNOSIS — Z7982 Long term (current) use of aspirin: Secondary | ICD-10-CM | POA: Diagnosis not present

## 2016-01-18 DIAGNOSIS — S72141D Displaced intertrochanteric fracture of right femur, subsequent encounter for closed fracture with routine healing: Secondary | ICD-10-CM | POA: Diagnosis not present

## 2016-01-19 ENCOUNTER — Other Ambulatory Visit: Payer: Self-pay | Admitting: *Deleted

## 2016-01-19 ENCOUNTER — Encounter: Payer: Self-pay | Admitting: *Deleted

## 2016-01-19 NOTE — Patient Outreach (Addendum)
[facility]) Care Management   01/19/2016  Alice Campbell 1950-08-23 [id number]  Alice Campbell is an 65 y.o. female  Subjective:  Alice Campbell reports that she is managing well at home Patient discussed progress with home health physical therapy able to walk outside down ramp during session , PT  is following her this week at  3 days a week and she will be evaluated on Friday for how much longer her therapy will be at home, therapist has discussed outpatient physical therapy as next step.  Alice Campbell discussed pain managed with prn oxycodone or tylenol as needed. Alice Campbell discussed that she continues to do daily exercises.  Patient tolerating diet well.     Objective: Blood pressure 128/70, pulse 78, resp. rate 18, SpO2 98 %.   Patient ambulating using her rolling walker during visit,noted new ramp at entrance of home. Review of Systems  Constitutional: Negative.   HENT: Negative.   Eyes: Negative.   Respiratory: Negative.   Cardiovascular: Positive for leg swelling.       Leg swelling while sitting during the day  Gastrointestinal: Negative.   Genitourinary: Negative.   Musculoskeletal: Positive for joint pain.  Skin: Negative.   Neurological: Negative.   Psychiatric/Behavioral: Negative.     Physical Exam  Constitutional: She is oriented to person, place, and time. She appears well-developed and well-nourished.  Cardiovascular: Normal rate, normal heart sounds and intact distal pulses.   Respiratory: Effort normal.  GI: Soft.  Musculoskeletal:  Using rolling walker  Neurological: She is alert and oriented to person, place, and time.  Skin: Skin is warm and dry.  Psychiatric: She has a normal mood and affect. Her behavior is normal. Judgment and thought content normal.    Encounter Medications:  Patient was recently discharged from hospital and all medications have been reviewed. Outpatient Encounter Prescriptions as of 01/19/2016  Medication Sig  .  amLODipine (NORVASC) 5 MG tablet TAKE 1 TABLET (5 MG TOTAL) BY MOUTH DAILY.  Marland Kitchen aspirin EC 81 MG tablet Take 81 mg by mouth daily.  Marland Kitchen atorvastatin (LIPITOR) 20 MG tablet TAKE 1 TABLET (20 MG TOTAL) BY MOUTH DAILY.  Marland Kitchen oxycodone (OXY-IR) 5 MG capsule Take 5 mg by mouth every 4 (four) hours as needed.  . senna (SENOKOT) 8.6 MG TABS tablet Take 1 tablet by mouth at bedtime as needed for mild constipation.  . diclofenac (VOLTAREN) 75 MG EC tablet Take 75 mg by mouth once. Reported on 01/12/2016  . erythromycin ophthalmic ointment Place 1 cm ribbon in the right eye twice daily for 5-7 days (Patient not taking: Reported on 01/04/2016)  . gabapentin (NEURONTIN) 300 MG capsule TAKE 1 CAPSULE (300 MG TOTAL) BY MOUTH AT BEDTIME. (Patient not taking: Reported on 01/04/2016)  . ondansetron (ZOFRAN) 4 MG tablet Take 1 tablet (4 mg total) by mouth every 8 (eight) hours as needed for nausea or vomiting. (Patient not taking: Reported on 01/04/2016)  . traMADol (ULTRAM) 50 MG tablet TAKE 1 TABLET BY MOUTH EVERY 8 HOURS AS NEEDED (Patient not taking: Reported on 01/12/2016)  . zoster vaccine live, PF, (ZOSTAVAX) [id number] UNT/0.65ML injection Inject 19,400 Units into the skin once. (Patient not taking: Reported on 01/04/2016)   No facility-administered encounter medications on file as of 01/19/2016.     Functional Status:   In your present state of health, do you have any difficulty performing the following activities: 01/12/2016  Hearing? N  Vision? N  Difficulty concentrating or making decisions? N  Walking or climbing  stairs? Y  Dressing or bathing? N  Doing errands, shopping? Y  Preparing Food and eating ? N  Using the Toilet? N  In the past six months, have you accidently leaked urine? Y  Do you have problems with loss of bowel control? N  Managing your Medications? N  Managing your Finances? N  Housekeeping or managing your Housekeeping? N  Some recent data might be hidden    Fall/Depression Screening:     PHQ 2/9 Scores 01/12/2016  PHQ - 2 Score 0    Assessment:  Routine home visit, home very neat , no scatter rugs noted. Patient has ramp at entrance of her home that was built by church group.   Recent right hip surgery - Patient progressing well, doing exercises as assigned per physical therapist. Pain controlled, incision healed. Has follow up appointment with orthopedist on 7/28 and PCP on 8/4.   Fall Risk - High Fall risk, reviewed fall prevention.   History of Hypertension- patient reports her blood pressure reading has been within her normal during physical therapy visit. Patient noted leg swelling at times, Reviewed sodium education and importance of limiting. Alice Campbell is not interested in checking her blood pressure at home taking medication as prescribed.      Plan:  Will continue with weekly telephone telephonic  outreaches as part of the transition of care program.  Will send MD this visit note.  Provided and reviewed EMMI handouts on Hypertension what to do, and Discharge Hip surgery.    THN CM Care Plan Problem One   Flowsheet Row Most Recent Value  Care Plan Problem One  Knowledge deficit related to post hip surgery routines as evidenced by recent hip surgery hospital admission  Role Documenting the Problem One  Care Management Coordinator  Care Plan for Problem One  Active  THN Long Term Goal (31-90 days)  Patient will not experience hospital admission in the next 31 days  THN Long Term Goal Start Date  01/04/16  Interventions for Problem One Long Term Goal  Home visited today, provided and reviewed thn packet ., Reviewed EMMI handout on post hip surgery    THN CM Short Term Goal #1 (0-30 days)  Patient will report increased knowledge of fall prevention strategies in the next 31 days  THN CM Short Term Goal #1 Start Date  01/04/16  Interventions for Short Term Goal #1  Reviewed fall precautions and safety measure from [facility] booklet in packet   THN CM Short Term Goal  #2 (0-30 days)  Patient will report continued participatiion in physical therapy exercises in the next 30 days  THN CM Short Term Goal #2 Start Date  01/04/16  Interventions for Short Term Goal #2  Encouraged patient to continue to do daily exercise as instruced by home health therapy     Egbert Garibaldi, RN, [facility] Care Management [phone]- Mobile [phone]- Toll Free Main Office

## 2016-01-22 DIAGNOSIS — Z8781 Personal history of (healed) traumatic fracture: Secondary | ICD-10-CM | POA: Diagnosis not present

## 2016-01-22 DIAGNOSIS — S72001D Fracture of unspecified part of neck of right femur, subsequent encounter for closed fracture with routine healing: Secondary | ICD-10-CM | POA: Diagnosis not present

## 2016-01-22 DIAGNOSIS — Z967 Presence of other bone and tendon implants: Secondary | ICD-10-CM | POA: Diagnosis not present

## 2016-01-27 ENCOUNTER — Other Ambulatory Visit: Payer: Self-pay | Admitting: *Deleted

## 2016-01-27 DIAGNOSIS — S72141D Displaced intertrochanteric fracture of right femur, subsequent encounter for closed fracture with routine healing: Secondary | ICD-10-CM | POA: Diagnosis not present

## 2016-01-27 DIAGNOSIS — I10 Essential (primary) hypertension: Secondary | ICD-10-CM | POA: Diagnosis not present

## 2016-01-27 DIAGNOSIS — Z7982 Long term (current) use of aspirin: Secondary | ICD-10-CM | POA: Diagnosis not present

## 2016-01-27 DIAGNOSIS — M1991 Primary osteoarthritis, unspecified site: Secondary | ICD-10-CM | POA: Diagnosis not present

## 2016-01-27 DIAGNOSIS — Z87440 Personal history of urinary (tract) infections: Secondary | ICD-10-CM | POA: Diagnosis not present

## 2016-01-27 DIAGNOSIS — Z9181 History of falling: Secondary | ICD-10-CM | POA: Diagnosis not present

## 2016-01-27 DIAGNOSIS — W19XXXD Unspecified fall, subsequent encounter: Secondary | ICD-10-CM | POA: Diagnosis not present

## 2016-01-27 NOTE — Patient Outreach (Signed)
[facility]) Care Management  01/27/2016  Alice Campbell 11-11-1950 [id number]   Transition of care   RN placed call to Alice Campbell, HIPAA verified. Patient reports that she is doing fine, she continues to have home physical therapy visits, she had follow appointment with orthopedic on this week and is progressing well. Alice Campbell plan it continue home therapy for the next two weeks then transition to outpatient therapy. Alice Campbell will have transportation as needed to appointments.  Per Patient her pain is controlled with current medications as she continues to work on home exercises  Patient denies any new concerns at this time, she follow appointment with PCP on 8/8. Discussed transition of care program ending soon,  if no further care management needs to be addressed.   Plan Plan final transition of care call in the next week , if no further care management needs to be addressed, patient in agreement.  THN CM Care Plan Problem One   Flowsheet Row Most Recent Value  Care Plan Problem One  Knowledge deficit related to post hip surgery routines as evidenced by recent hip surgery hospital admission  Role Documenting the Problem One  Care Management [place] for Problem One  Active  THN Long Term Goal (31-90 days)  Patient will not experience hospital admission in the next 31 days  THN Long Term Goal Start Date  01/04/16  Interventions for Problem One Long Term Goal  Reviewed importance of notifying MD sooner of concerns   THN CM Short Term Goal #1 (0-30 days)  Patient will report increased knowledge of fall prevention strategies in the next 31 days  THN CM Short Term Goal #1 Start Date  01/04/16  Interventions for Short Term Goal #1  Reinforced fall prevention measures   THN CM Short Term Goal #2 (0-30 days)  Patient will report continued participatiion in physical therapy exercises in the next 30 days  [facility]N CM Short Term Goal #2 Start Date  01/04/16  [facility] CM  Short Term Goal #2 Met Date  01/27/16     Joylene Draft, RN, [place] Care Management [phone]- Mobile [phone]- [place]

## 2016-02-01 DIAGNOSIS — W19XXXD Unspecified fall, subsequent encounter: Secondary | ICD-10-CM | POA: Diagnosis not present

## 2016-02-01 DIAGNOSIS — Z9181 History of falling: Secondary | ICD-10-CM | POA: Diagnosis not present

## 2016-02-01 DIAGNOSIS — I10 Essential (primary) hypertension: Secondary | ICD-10-CM | POA: Diagnosis not present

## 2016-02-01 DIAGNOSIS — M1991 Primary osteoarthritis, unspecified site: Secondary | ICD-10-CM | POA: Diagnosis not present

## 2016-02-01 DIAGNOSIS — S72141D Displaced intertrochanteric fracture of right femur, subsequent encounter for closed fracture with routine healing: Secondary | ICD-10-CM | POA: Diagnosis not present

## 2016-02-01 DIAGNOSIS — Z7982 Long term (current) use of aspirin: Secondary | ICD-10-CM | POA: Diagnosis not present

## 2016-02-01 DIAGNOSIS — Z87440 Personal history of urinary (tract) infections: Secondary | ICD-10-CM | POA: Diagnosis not present

## 2016-02-02 ENCOUNTER — Ambulatory Visit (INDEPENDENT_AMBULATORY_CARE_PROVIDER_SITE_OTHER): Payer: PPO | Admitting: Family Medicine

## 2016-02-02 ENCOUNTER — Encounter: Payer: Self-pay | Admitting: Family Medicine

## 2016-02-02 VITALS — BP 118/82 | HR 89 | Temp 98.2°F | Wt 231.2 lb

## 2016-02-02 DIAGNOSIS — R739 Hyperglycemia, unspecified: Secondary | ICD-10-CM

## 2016-02-02 DIAGNOSIS — D582 Other hemoglobinopathies: Secondary | ICD-10-CM

## 2016-02-02 DIAGNOSIS — E785 Hyperlipidemia, unspecified: Secondary | ICD-10-CM | POA: Diagnosis not present

## 2016-02-02 DIAGNOSIS — I10 Essential (primary) hypertension: Secondary | ICD-10-CM | POA: Insufficient documentation

## 2016-02-02 DIAGNOSIS — S72001S Fracture of unspecified part of neck of right femur, sequela: Secondary | ICD-10-CM

## 2016-02-02 DIAGNOSIS — S72009A Fracture of unspecified part of neck of unspecified femur, initial encounter for closed fracture: Secondary | ICD-10-CM | POA: Insufficient documentation

## 2016-02-02 LAB — COMPREHENSIVE METABOLIC PANEL
ALK PHOS: 108 U/L (ref 39–117)
ALT: 11 U/L (ref 0–35)
AST: 14 U/L (ref 0–37)
Albumin: 3.9 g/dL (ref 3.5–5.2)
BUN: 9 mg/dL (ref 6–23)
CO2: 28 meq/L (ref 19–32)
CREATININE: 0.75 mg/dL (ref 0.40–1.20)
Calcium: 9.4 mg/dL (ref 8.4–10.5)
Chloride: 103 mEq/L (ref 96–112)
GFR: 82.16 mL/min (ref >60.00)
GLUCOSE: 104 mg/dL — AB (ref 70–99)
Potassium: 3.9 mEq/L (ref 3.5–5.1)
Sodium: 138 mEq/L (ref 135–145)
TOTAL PROTEIN: 6.9 g/dL (ref 6.0–8.3)
Total Bilirubin: 0.5 mg/dL (ref 0.2–1.2)

## 2016-02-02 LAB — CBC
HCT: 42.7 % (ref 36.0–46.0)
HEMOGLOBIN: 14.1 g/dL (ref 12.0–15.0)
MCHC: 33.1 g/dL (ref 30.0–36.0)
MCV: 94.6 fl (ref 78.0–100.0)
Platelets: 201 10*3/uL (ref 150.0–400.0)
RBC: 4.51 Mil/uL (ref 3.87–5.11)
RDW: 14.6 % (ref 11.5–15.5)
WBC: 6.3 10*3/uL (ref 4.0–10.5)

## 2016-02-02 LAB — LIPID PANEL
CHOL/HDL RATIO: 3
Cholesterol: 146 mg/dL (ref 0–200)
HDL: 46.5 mg/dL (ref >39.00)
LDL Cholesterol: 73 mg/dL (ref 0–99)
NonHDL: 99.37
Triglycerides: 134 mg/dL (ref 0.0–149.0)
VLDL: 26.8 mg/dL (ref 0.0–40.0)

## 2016-02-02 LAB — HEMOGLOBIN A1C: HEMOGLOBIN A1C: 5.2 % (ref 4.6–6.5)

## 2016-02-02 NOTE — Progress Notes (Signed)
Subjective:  Patient ID: Alice Campbell, female    DOB: [birth year]  Age: 65 y.o. MRN: [id number]  CC: Follow up   HPI:  65 year old female with hypertension, hyperlipidemia presents for follow-up.  HTN  Stable. Well-controlled.  Compliant with amlodipine 5 mg daily.  HLD  Uncontrolled.  Last lipid panel was in 2015.  In need of labs today.  She endorses compliance with Lipitor 20 mg daily.  Recent fall/fracture  Patient is recently suffered a hip fracture.  This occurred while she was on vacation. Her surgery was done in [place].  Doing well at this time. Followed by orthopedics.  Taking oxycodone 5 mg at night. I am not prescribing. This is being prescribed by orthopedics.  She needs a DEXA scan for evaluation of osteoporosis given age and recent fracture..  Social Hx   Social History   Social History  . Marital status: Married    Spouse name: N/A  . Number of children: N/A  . Years of education: 57   Occupational History  . Public Work     Semi-retired   Social History Main Topics  . Smoking status: Former Smoker    Quit date: 01/19/1996  . Smokeless tobacco: Never Used     Comment: quit in 2000  . Alcohol use 1.0 - 2.0 oz/week    2 - 4 Standard drinks or equivalent per week     Comment: few drinks a week   . Drug use: No  . Sexual activity: Not Asked   Other Topics Concern  . None   Social History Narrative   Mrs. Butler grew up in [place], [place]. She is currently living in [place] with her husband. She enjoys swimming and reading.   Review of Systems  Constitutional: Negative.   Musculoskeletal:       Hip pain.   Objective:  BP 118/82 (BP Location: Left Arm, Patient Position: Sitting, Cuff Size: Normal)   Pulse 89   Temp 98.2 F (36.8 C) (Oral)   Wt 231 lb 4 oz (104.9 kg) Comment: pt needed walker while on scale.  SpO2 96%   BMI 37.82 kg/m   BP/Weight 02/02/2016 01/19/2016 9/50/9326  Systolic BP 712 458 099  Diastolic BP 82 70 84   Wt. (Lbs) 231.25 - 237.4  BMI 37.82 - 38.84   Physical Exam  Constitutional: She is oriented to person, place, and time. She appears well-developed. No distress.  Cardiovascular: Normal rate and regular rhythm.   2/6 systolic murmur.  Pulmonary/Chest: Effort normal. She has no wheezes. She has no rales.  Neurological: She is alert and oriented to person, place, and time.  Psychiatric: She has a normal mood and affect.  Vitals reviewed.  Lab Results  Component Value Date   WBC 3.5 (L) 05/30/2014   HGB 14.8 05/30/2014   HCT 45.1 05/30/2014   PLT 167.0 05/30/2014   GLUCOSE 86 05/30/2014   CHOL 182 05/08/2014   TRIG 99.0 05/08/2014   HDL 47.60 05/08/2014   LDLCALC 115 (H) 05/08/2014   ALT 22 05/30/2014   AST 25 05/30/2014   NA 139 05/30/2014   K 4.9 05/30/2014   CL 104 05/30/2014   CREATININE 0.8 05/30/2014   BUN 16 05/30/2014   CO2 27 05/30/2014   TSH 0.46 12/09/2013    Assessment & Plan:   Problem List Items Addressed This Visit    Hip fracture ([place])    New problem. Stable at this time. Needs Dexa. Discussed this with patient today. She  wants to wait.      HTN (hypertension) - Primary    Established problem, stable. Continue Norvasc. Labs today.      Relevant Orders   Comp Met (CMET)   Hyperlipidemia    Established problem, uncontrolled. Lipid panel today. Continue Lipitor.      Relevant Orders   Lipid Profile    Other Visit Diagnoses    Blood glucose elevated       Relevant Orders   HgB A1c   Elevated hemoglobin (HCC)       Relevant Orders   CBC     Follow-up: 3-6 months.  [place]

## 2016-02-02 NOTE — Assessment & Plan Note (Signed)
Established problem, uncontrolled. Lipid panel today. Continue Lipitor.

## 2016-02-02 NOTE — Assessment & Plan Note (Signed)
Established problem, stable. Continue Norvasc. Labs today.

## 2016-02-02 NOTE — Assessment & Plan Note (Signed)
New problem. Stable at this time. Needs Dexa. Discussed this with patient today. She wants to wait.

## 2016-02-02 NOTE — Progress Notes (Signed)
Pre visit review using our clinic review tool, if applicable. No additional management support is needed unless otherwise documented below in the visit note. 

## 2016-02-02 NOTE — Patient Instructions (Signed)
It was nice to see you today.  We will call your lab results.  Continue your medications as prescribed.  You need a mammogram and a bone density scan in the near future.  Follow up in 3-6 months.  Take care  Dr. Adriana Simasook

## 2016-02-03 ENCOUNTER — Encounter: Payer: Self-pay | Admitting: *Deleted

## 2016-02-03 ENCOUNTER — Other Ambulatory Visit: Payer: Self-pay | Admitting: *Deleted

## 2016-02-03 NOTE — Patient Outreach (Signed)
[facility]) Care Management  02/03/2016  Alice Campbell 1950-11-27 [id number]   Transition of care call  RNCM placed call to patient, HIPPA verified, Alice Campbell reports that she is doing well post her fall while on vacation and hip surgery.  Patient discussed her recent visit to PCP.  Patient reports that this is her final week of home health therapy and she continues to make good progress and she will begin out patient therapy on next week. Discussed with [name] that this is the final transition of care call,  patient denies any new concerns/needs for community care manager, goals have been met.  Encouraged patient to notify [facility] office for future care management needs.    Plan Will close case per protocol Will notify MD of case closure.  Joylene Draft, RN, [facility] [phone]- Mobile [phone]- Toll Free Main Office

## 2016-02-10 DIAGNOSIS — M25551 Pain in right hip: Secondary | ICD-10-CM | POA: Diagnosis not present

## 2016-02-15 DIAGNOSIS — M25551 Pain in right hip: Secondary | ICD-10-CM | POA: Diagnosis not present

## 2016-02-16 ENCOUNTER — Other Ambulatory Visit: Payer: Self-pay | Admitting: Family Medicine

## 2016-02-16 ENCOUNTER — Telehealth: Payer: Self-pay | Admitting: *Deleted

## 2016-02-16 MED ORDER — TRAZODONE HCL 50 MG PO TABS: 50.0000 mg | ORAL_TABLET | Freq: Every evening | ORAL | 3 refills | Status: DC | PRN

## 2016-02-16 NOTE — Telephone Encounter (Signed)
Patient has requested to have a medication prescribed to help her sleep. She stated that it not due to her hip pain, she just  cant fall asleep at night.

## 2016-02-16 NOTE — Telephone Encounter (Signed)
Please advise 

## 2016-02-16 NOTE — Telephone Encounter (Signed)
Rx sent 

## 2016-02-17 NOTE — Telephone Encounter (Signed)
Noted thanks °

## 2016-02-18 DIAGNOSIS — M25551 Pain in right hip: Secondary | ICD-10-CM | POA: Diagnosis not present

## 2016-02-21 ENCOUNTER — Other Ambulatory Visit: Payer: Self-pay | Admitting: Nurse Practitioner

## 2016-02-22 ENCOUNTER — Other Ambulatory Visit: Payer: Self-pay | Admitting: Nurse Practitioner

## 2016-02-22 DIAGNOSIS — M25551 Pain in right hip: Secondary | ICD-10-CM | POA: Diagnosis not present

## 2016-02-22 NOTE — Telephone Encounter (Signed)
Gabapentin refilled 06/30/15. Please advise?

## 2016-02-23 ENCOUNTER — Other Ambulatory Visit: Payer: Self-pay | Admitting: Family Medicine

## 2016-02-23 ENCOUNTER — Telehealth: Payer: Self-pay | Admitting: Family Medicine

## 2016-02-23 MED ORDER — TRAZODONE HCL 50 MG PO TABS: 50.0000 mg | ORAL_TABLET | Freq: Every evening | ORAL | 3 refills | Status: DC | PRN

## 2016-02-23 NOTE — Telephone Encounter (Signed)
Rx refill

## 2016-02-23 NOTE — Telephone Encounter (Signed)
Pt called about her traZODone (DESYREL) 50 MG tablet, she would like it refilled. She says her pharmacy told her that it was not authorized by Dr. Adriana Simasook. Please advise pt.

## 2016-02-23 NOTE — Telephone Encounter (Signed)
Patient is requesting refill on tramadol, please advise, it looks like you didn't approve, can I get rationale for patient or approval, thanks

## 2016-02-24 ENCOUNTER — Other Ambulatory Visit: Payer: Self-pay | Admitting: Family Medicine

## 2016-02-24 DIAGNOSIS — M25551 Pain in right hip: Secondary | ICD-10-CM | POA: Diagnosis not present

## 2016-02-24 NOTE — Telephone Encounter (Signed)
Do you have this, was it faxed? thanks

## 2016-02-24 NOTE — Telephone Encounter (Signed)
Pt is calling back stating [facility] and the Rx is not there. Please advise?

## 2016-02-24 NOTE — Telephone Encounter (Signed)
Discontinued by Dr.Cook on 02/02/16. Pt is wanting it refilled for pain. Please advise?

## 2016-02-24 NOTE — Telephone Encounter (Signed)
Trazodone was sent the day of her visit.

## 2016-02-24 NOTE — Telephone Encounter (Signed)
Pt was called and told that tramadol was discontinued from her list and she stated that she wants to continue taking them for pain. She stated that trazodone is not helping with her sleeping problem. Pt was taking 50 of tramadol and would like a new prescription called in. Refill request created.

## 2016-02-24 NOTE — Telephone Encounter (Signed)
Can you refax it.

## 2016-02-24 NOTE — Telephone Encounter (Signed)
Tramadol was discontinued and pt was given trazadone.

## 2016-02-24 NOTE — Telephone Encounter (Signed)
It was sent though? thanks

## 2016-02-25 MED ORDER — TRAMADOL HCL 50 MG PO TABS: 50.0000 mg | ORAL_TABLET | Freq: Three times a day (TID) | ORAL | 0 refills | Status: DC | PRN

## 2016-02-25 NOTE — Telephone Encounter (Signed)
faxed

## 2016-03-01 DIAGNOSIS — M25551 Pain in right hip: Secondary | ICD-10-CM | POA: Diagnosis not present

## 2016-03-03 ENCOUNTER — Other Ambulatory Visit: Payer: Self-pay | Admitting: Nurse Practitioner

## 2016-03-03 DIAGNOSIS — M25551 Pain in right hip: Secondary | ICD-10-CM | POA: Diagnosis not present

## 2016-03-07 DIAGNOSIS — M25551 Pain in right hip: Secondary | ICD-10-CM | POA: Diagnosis not present

## 2016-03-08 DIAGNOSIS — M25561 Pain in right knee: Secondary | ICD-10-CM | POA: Diagnosis not present

## 2016-03-08 DIAGNOSIS — G8929 Other chronic pain: Secondary | ICD-10-CM | POA: Diagnosis not present

## 2016-03-08 DIAGNOSIS — M1711 Unilateral primary osteoarthritis, right knee: Secondary | ICD-10-CM | POA: Diagnosis not present

## 2016-03-10 DIAGNOSIS — M25551 Pain in right hip: Secondary | ICD-10-CM | POA: Diagnosis not present

## 2016-03-10 DIAGNOSIS — R29898 Other symptoms and signs involving the musculoskeletal system: Secondary | ICD-10-CM | POA: Diagnosis not present

## 2016-03-15 DIAGNOSIS — M25551 Pain in right hip: Secondary | ICD-10-CM | POA: Diagnosis not present

## 2016-03-17 DIAGNOSIS — M25551 Pain in right hip: Secondary | ICD-10-CM | POA: Diagnosis not present

## 2016-03-18 ENCOUNTER — Other Ambulatory Visit: Payer: Self-pay | Admitting: Family Medicine

## 2016-03-18 MED ORDER — TRAZODONE HCL 50 MG PO TABS: 50.0000 mg | ORAL_TABLET | Freq: Every evening | ORAL | 3 refills | Status: DC | PRN

## 2016-03-18 NOTE — Telephone Encounter (Signed)
Refilled 01/2016. Pharmacy asking for a 90-day sent not 30 day. Please advise

## 2016-03-21 ENCOUNTER — Other Ambulatory Visit: Payer: Self-pay | Admitting: Nurse Practitioner

## 2016-03-21 DIAGNOSIS — M25551 Pain in right hip: Secondary | ICD-10-CM | POA: Diagnosis not present

## 2016-03-23 DIAGNOSIS — M25551 Pain in right hip: Secondary | ICD-10-CM | POA: Diagnosis not present

## 2016-03-28 DIAGNOSIS — M7061 Trochanteric bursitis, right hip: Secondary | ICD-10-CM | POA: Diagnosis not present

## 2016-03-28 DIAGNOSIS — M25551 Pain in right hip: Secondary | ICD-10-CM | POA: Diagnosis not present

## 2016-03-28 DIAGNOSIS — S72141D Displaced intertrochanteric fracture of right femur, subsequent encounter for closed fracture with routine healing: Secondary | ICD-10-CM | POA: Diagnosis not present

## 2016-03-31 DIAGNOSIS — M25551 Pain in right hip: Secondary | ICD-10-CM | POA: Diagnosis not present

## 2016-04-05 DIAGNOSIS — M25551 Pain in right hip: Secondary | ICD-10-CM | POA: Diagnosis not present

## 2016-04-12 DIAGNOSIS — M25551 Pain in right hip: Secondary | ICD-10-CM | POA: Diagnosis not present

## 2016-04-14 DIAGNOSIS — M25551 Pain in right hip: Secondary | ICD-10-CM | POA: Diagnosis not present

## 2016-04-19 DIAGNOSIS — M25551 Pain in right hip: Secondary | ICD-10-CM | POA: Diagnosis not present

## 2016-04-22 ENCOUNTER — Ambulatory Visit (INDEPENDENT_AMBULATORY_CARE_PROVIDER_SITE_OTHER): Payer: PPO | Admitting: Family Medicine

## 2016-04-22 ENCOUNTER — Encounter (INDEPENDENT_AMBULATORY_CARE_PROVIDER_SITE_OTHER): Payer: Self-pay

## 2016-04-22 ENCOUNTER — Ambulatory Visit (INDEPENDENT_AMBULATORY_CARE_PROVIDER_SITE_OTHER): Payer: PPO

## 2016-04-22 ENCOUNTER — Encounter: Payer: Self-pay | Admitting: Family Medicine

## 2016-04-22 ENCOUNTER — Other Ambulatory Visit: Payer: Self-pay | Admitting: Family Medicine

## 2016-04-22 DIAGNOSIS — R109 Unspecified abdominal pain: Secondary | ICD-10-CM

## 2016-04-22 LAB — CBC
HCT: 44.3 % (ref 36.0–46.0)
HEMOGLOBIN: 14.9 g/dL (ref 12.0–15.0)
MCHC: 33.7 g/dL (ref 30.0–36.0)
MCV: 92.7 fl (ref 78.0–100.0)
PLATELETS: 229 10*3/uL (ref 150.0–400.0)
RBC: 4.77 Mil/uL (ref 3.87–5.11)
RDW: 15.5 % (ref 11.5–15.5)
WBC: 8.8 10*3/uL (ref 4.0–10.5)

## 2016-04-22 LAB — COMPREHENSIVE METABOLIC PANEL
ALK PHOS: 104 U/L (ref 39–117)
ALT: 17 U/L (ref 0–35)
AST: 18 U/L (ref 0–37)
Albumin: 3.8 g/dL (ref 3.5–5.2)
BILIRUBIN TOTAL: 0.8 mg/dL (ref 0.2–1.2)
BUN: 14 mg/dL (ref 6–23)
CALCIUM: 9.4 mg/dL (ref 8.4–10.5)
CO2: 27 mEq/L (ref 19–32)
Chloride: 102 mEq/L (ref 96–112)
Creatinine, Ser: 0.81 mg/dL (ref 0.40–1.20)
GFR: 75.13 mL/min (ref >60.00)
Glucose, Bld: 116 mg/dL — ABNORMAL HIGH (ref 70–99)
Potassium: 3.9 mEq/L (ref 3.5–5.1)
Sodium: 135 mEq/L (ref 135–145)
TOTAL PROTEIN: 6.5 g/dL (ref 6.0–8.3)

## 2016-04-22 LAB — URINALYSIS, MICROSCOPIC ONLY: WBC, UA: NONE SEEN (ref 0–?)

## 2016-04-22 LAB — POCT URINALYSIS DIPSTICK
BILIRUBIN UA: NEGATIVE
Blood, UA: NEGATIVE
GLUCOSE UA: NEGATIVE
Nitrite, UA: NEGATIVE
Urobilinogen, UA: 0.2
pH, UA: 6

## 2016-04-22 MED ORDER — HYDROCODONE-ACETAMINOPHEN 5-325 MG PO TABS: 1.0000 | ORAL_TABLET | Freq: Four times a day (QID) | ORAL | 0 refills | Status: DC | PRN

## 2016-04-22 MED ORDER — TAMSULOSIN HCL 0.4 MG PO CAPS: 0.4000 mg | ORAL_CAPSULE | Freq: Every day | ORAL | 0 refills | Status: DC

## 2016-04-22 NOTE — Progress Notes (Signed)
  Tommi Rumps, MD Phone: [phone]  Alice Campbell is a 65 y.o. female who presents today for same-day visit.  Patient notes 2-3 days of right flank pain. Notes starts in the right flank and goes around her right back. She notes no dysuria, urinary frequency, urinary urgency, hematuria, chest pain, shortness of breath, abdominal pain, diarrhea, or injury. Notes it feels like a hot poker at times. Is so severe at times that she vomits. She has tried gabapentin and tramadol with some benefit. No history of kidney stone. She is status post cholecystectomy.  PMH: Former smoker   ROS see history of present illness  Objective  Physical Exam Vitals:   04/22/16 1023  BP: 138/82  Pulse: 96  Temp: 98.2 F (36.8 C)    BP Readings from Last 3 Encounters:  04/22/16 138/82  02/02/16 118/82  01/19/16 128/70   Wt Readings from Last 3 Encounters:  04/22/16 223 lb 12.8 oz (101.5 kg)  02/02/16 231 lb 4 oz (104.9 kg)  07/07/15 237 lb 6.4 oz (107.7 kg)    Physical Exam  Constitutional: She is well-developed, well-nourished, and in no distress.  Cardiovascular: Normal rate, regular rhythm and normal heart sounds.   Pulmonary/Chest: Effort normal and breath sounds normal.  Abdominal: Soft. Bowel sounds are normal. She exhibits no distension. There is tenderness (mild right upper quadrant tenderness and right lower rib and flank tenderness, no overlying skin changes in these areas). There is no rebound and no guarding.  Musculoskeletal: She exhibits no edema.  No midline spine tenderness, no midline spine step-off, no muscular back tenderness  Neurological: She is alert. Gait normal.  Skin: Skin is warm and dry.     Assessment/Plan: Please see individual problem list.  Right flank pain Differential for patient's symptoms and exam include musculoskeletal cause, kidney stone, hepatic cause, and bile duct stone. Given history I doubt cardiac cause, pulmonary cause, and VTE. We will  obtain a UA. We will also obtain CMP and CBC. We will obtain a KUB. We will send urine for culture and microscopy. We will treat as though she has a kidney stone with Flomax and Vicodin for pain. She is given return precautions. If not improving by next week she will let us know. Advised not to take Vicodin with tramadol or gabapentin.  Orders Placed This Encounter  Procedures  . Urine Culture  . DG Abd 1 View    Standing Status:   Future    Number of Occurrences:   1    Standing Expiration Date:   06/22/2017    Order Specific Question:   Reason for Exam (SYMPTOM  OR DIAGNOSIS REQUIRED)    Answer:   right flank pain, colicky in nature    Order Specific Question:   Preferred imaging location?    Answer:   [facility]  . CBC  . Comp Met (CMET)  . Urine Microscopic Only  . POCT Urinalysis Dipstick    Meds ordered this encounter  Medications  . HYDROcodone-acetaminophen (NORCO/VICODIN) 5-325 MG tablet    Sig: Take 1 tablet by mouth every 6 (six) hours as needed for moderate pain.    Dispense:  15 tablet    Refill:  0  . tamsulosin (FLOMAX) 0.4 MG CAPS capsule    Sig: Take 1 capsule (0.4 mg total) by mouth daily.    Dispense:  30 capsule    Refill:  0    Tommi Rumps, MD [place]

## 2016-04-22 NOTE — Telephone Encounter (Signed)
Last refill 02/25/16 for 30 tablets of Tramadol, patient last OV with PCP 02/02/16 please advise to refill?

## 2016-04-22 NOTE — Patient Instructions (Signed)
Nice to see you. We are going to do an x-ray to evaluate for kidney stone. We are going to send your urine for culture. We will obtain some lab work. We'll treat your pain with Vicodin. We'll treat with Flomax to help you pass the stone. We'll call you with the results.  If you develop uncontrollable pain, fevers, abdominal pain, or any new or change in symptoms please see medical attention.

## 2016-04-22 NOTE — Assessment & Plan Note (Signed)
Differential for patient's symptoms and exam include musculoskeletal cause, kidney stone, hepatic cause, and bile duct stone. Given history I doubt cardiac cause, pulmonary cause, and VTE. We will obtain a UA. We will also obtain CMP and CBC. We will obtain a KUB. We will send urine for culture and microscopy. We will treat as though she has a kidney stone with Flomax and Vicodin for pain. She is given return precautions. If not improving by next week she will let us know.

## 2016-04-22 NOTE — Telephone Encounter (Signed)
faxed

## 2016-04-22 NOTE — Progress Notes (Signed)
Pre visit review using our clinic review tool, if applicable. No additional management support is needed unless otherwise documented below in the visit note. 

## 2016-04-23 LAB — URINE CULTURE: Organism ID, Bacteria: NO GROWTH

## 2016-04-26 ENCOUNTER — Telehealth: Payer: Self-pay | Admitting: Family Medicine

## 2016-04-26 NOTE — Telephone Encounter (Signed)
Pt called and stated that Dr. Birdie SonsSonnenberg gave pt her Flomax and she is having palpitations. Do to trigger word sent call to [facility]. Thank you!

## 2016-04-26 NOTE — Telephone Encounter (Signed)
TELEPHONE ADVICE RECORD [facility]  Patient Name: Alice Campbell  DOB: 08/06/1950    Initial Comment Caller states she's heart fluttering.   Nurse Assessment  Nurse: Odis LusterBowers, RN, Bjorn Loserhonda Date/Time Lamount Cohen(Eastern Time): 04/26/2016 2:48:42 PM  Confirm and document reason for call. If symptomatic, describe symptoms. You must click the next button to save text entered. ---Caller states she's heart fluttering for the last 2-3 days. Was recently placed on Flomax. She feels tired without any energy.  Has the patient traveled out of the country within the last 30 days? ---Not Applicable  Does the patient have any new or worsening symptoms? ---Yes  Will a triage be completed? ---Yes  Related visit to physician within the last 2 weeks? ---Yes  Does the PT have any chronic conditions? (i.e. diabetes, asthma, etc.) ---Yes  List chronic conditions. ---HTN; high chol  Is this a behavioral health or substance abuse call? ---No     Guidelines    Guideline Title Affirmed Question Affirmed Notes  Heart Rate and Heartbeat Questions Age > 60 years (Exception: brief heart beat symptoms that went away and now feels well)    Final Disposition User   See Physician within 4 Hours (or PCP triage) Odis LusterBowers, RN, Rhonda    Referrals  GO TO FACILITY UNDECIDED   Disagree/Comply: Comply

## 2016-04-26 NOTE — Telephone Encounter (Signed)
Noted, will hold until [facility] note comes. thanks

## 2016-04-26 NOTE — Telephone Encounter (Signed)
Pt was called and stated that she was having heart flutters every once ina while not all the time. She was placed on schedule for 1115 on Thursday 04/28/16.

## 2016-04-27 DIAGNOSIS — S72141D Displaced intertrochanteric fracture of right femur, subsequent encounter for closed fracture with routine healing: Secondary | ICD-10-CM | POA: Diagnosis not present

## 2016-04-27 DIAGNOSIS — S72141K Displaced intertrochanteric fracture of right femur, subsequent encounter for closed fracture with nonunion: Secondary | ICD-10-CM | POA: Diagnosis not present

## 2016-04-27 NOTE — Telephone Encounter (Signed)
See additional note, patient  Scheduled for appt on Thursday.

## 2016-04-28 ENCOUNTER — Encounter: Payer: Self-pay | Admitting: Family Medicine

## 2016-04-28 ENCOUNTER — Other Ambulatory Visit: Payer: Self-pay | Admitting: Student

## 2016-04-28 ENCOUNTER — Ambulatory Visit (INDEPENDENT_AMBULATORY_CARE_PROVIDER_SITE_OTHER): Payer: PPO | Admitting: Family Medicine

## 2016-04-28 VITALS — BP 128/82 | HR 117 | Temp 98.5°F | Resp 16 | Wt 220.5 lb

## 2016-04-28 DIAGNOSIS — R002 Palpitations: Secondary | ICD-10-CM | POA: Insufficient documentation

## 2016-04-28 DIAGNOSIS — M25551 Pain in right hip: Secondary | ICD-10-CM | POA: Diagnosis not present

## 2016-04-28 DIAGNOSIS — S72141D Displaced intertrochanteric fracture of right femur, subsequent encounter for closed fracture with routine healing: Secondary | ICD-10-CM

## 2016-04-28 NOTE — Patient Instructions (Signed)
Stop the flomax.  Call if persists.  Take care  Dr. Adriana Simasook

## 2016-04-28 NOTE — Assessment & Plan Note (Addendum)
New problem. Uncertain etiology/prognosis at this time. No red flags. EKG unremarkable (see interpretation in note). Discussed potential etiologies and options for work up and potential treatment. Patient elected to wait on further workup with holter given lack of other symptoms and potential cause by Flomax (<1% per up to date). I advised her to let me know if her symptoms persist and we will proceed with Holter.

## 2016-04-28 NOTE — Progress Notes (Signed)
Pre visit review using our clinic review tool, if applicable. No additional management support is needed unless otherwise documented below in the visit note. 

## 2016-04-28 NOTE — Progress Notes (Addendum)
Subjective:  Patient ID: Alice Campbell, female    DOB: 1951-03-21  Age: 65 y.o. MRN: [id number]  CC: Palpitations  HPI:  65 year old female presents with complaints of palpitations.  Palpitations  Patient reports a 3-4 day history of palpitations.  Patient states that it feels like her heart is "fluttering".  No reports of skipping beats or double beating.  No chest pain, shortness of breath, presyncope.  Last for seconds and then resolve spontaneously.  Intermittent in nature.  She reports associated weakness/fatigue.  She states that this all started after she started Flomax for suspected kidney stone.  She has now discontinued the Flomax.  No other complaints or issues at this time.  Social Hx   Social History   Social History  . Marital status: Married    Spouse name: N/A  . Number of children: N/A  . Years of education: 812   Occupational History  . Public Work     Semi-retired   Social History Main Topics  . Smoking status: Former Smoker    Quit date: 01/19/1996  . Smokeless tobacco: Never Used     Comment: quit in 2000  . Alcohol use 1.0 - 2.0 oz/week    2 - 4 Standard drinks or equivalent per week     Comment: few drinks a week   . Drug use: No  . Sexual activity: Not Asked   Other Topics Concern  . None   Social History Narrative   Mrs. Robers grew up [place], [place]. She is currently living in [place] with her husband. She enjoys swimming and reading.    Review of Systems  Respiratory: Negative for chest tightness and shortness of breath.   Cardiovascular: Positive for palpitations. Negative for chest pain.   Objective:  BP 128/82 (BP Location: Right Arm, Patient Position: Sitting, Cuff Size: Large)   Pulse (!) 117   Temp 98.5 F (36.9 C) (Oral)   Resp 16   Wt 220 lb 8 oz (100 kg)   SpO2 93%   BMI 36.06 kg/m   BP/Weight 04/28/2016 04/22/2016 02/02/2016  Systolic BP 128 138 118  Diastolic BP 82 82 82  Wt. (Lbs) 220.5 223.8  231.25  BMI 36.06 36.6 37.82   Physical Exam  Constitutional: She is oriented to person, place, and time. She appears well-developed. No distress.  Cardiovascular: Regular rhythm.  Tachycardia present.   Pulmonary/Chest: Effort normal. She has no wheezes. She has no rales.  Neurological: She is alert and oriented to person, place, and time.  Psychiatric: She has a normal mood and affect.  Vitals reviewed.  Lab Results  Component Value Date   WBC 8.8 04/22/2016   HGB 14.9 04/22/2016   HCT 44.3 04/22/2016   PLT 229.0 04/22/2016   GLUCOSE 116 (H) 04/22/2016   CHOL 146 02/02/2016   TRIG 134.0 02/02/2016   HDL 46.50 02/02/2016   LDLCALC 73 02/02/2016   ALT 17 04/22/2016   AST 18 04/22/2016   NA 135 04/22/2016   K 3.9 04/22/2016   CL 102 04/22/2016   CREATININE 0.81 04/22/2016   BUN 14 04/22/2016   CO2 27 04/22/2016   TSH 0.46 12/09/2013   HGBA1C 5.2 02/02/2016   EKG - normal sinus rhythm at rate of 90. Normal intervals. No ST or T-wave changes. Normal EKG.  Assessment & Plan:   Problem List Items Addressed This Visit    Heart palpitations - Primary    New problem. Uncertain etiology/prognosis at this time. No red  flags. EKG unremarkable (see interpretation in note). Discussed potential etiologies and options for work up and potential treatment. Patient elected to wait on further workup with holter given lack of other symptoms and potential cause by Flomax (<1% per up to date). I advised her to let me know if her symptoms persist and we will proceed with Holter.      Relevant Orders   EKG 12-Lead (Completed)    Other Visit Diagnoses   None.    Follow-up: Return if symptoms worsen or fail to improve.  Everlene OtherJayce Cannen Dupras DO [facility] [place]

## 2016-05-05 DIAGNOSIS — M25551 Pain in right hip: Secondary | ICD-10-CM | POA: Diagnosis not present

## 2016-05-11 ENCOUNTER — Ambulatory Visit
Admission: RE | Admit: 2016-05-11 | Discharge: 2016-05-11 | Disposition: A | Discharge status: Home / Self Care | Payer: PPO | Source: Ambulatory Visit | Attending: Student | Admitting: Student

## 2016-05-11 DIAGNOSIS — S72141D Displaced intertrochanteric fracture of right femur, subsequent encounter for closed fracture with routine healing: Secondary | ICD-10-CM | POA: Diagnosis not present

## 2016-05-11 DIAGNOSIS — M5136 Other intervertebral disc degeneration, lumbar region: Secondary | ICD-10-CM | POA: Insufficient documentation

## 2016-05-11 DIAGNOSIS — M1611 Unilateral primary osteoarthritis, right hip: Secondary | ICD-10-CM | POA: Insufficient documentation

## 2016-05-11 DIAGNOSIS — M25551 Pain in right hip: Secondary | ICD-10-CM | POA: Diagnosis not present

## 2016-05-16 ENCOUNTER — Other Ambulatory Visit: Payer: Self-pay | Admitting: Student

## 2016-05-16 DIAGNOSIS — M1611 Unilateral primary osteoarthritis, right hip: Secondary | ICD-10-CM

## 2016-05-24 ENCOUNTER — Ambulatory Visit
Admission: RE | Admit: 2016-05-24 | Discharge: 2016-05-24 | Disposition: A | Discharge status: Home / Self Care | Payer: PPO | Source: Ambulatory Visit | Attending: Student | Admitting: Student

## 2016-05-24 DIAGNOSIS — M25551 Pain in right hip: Secondary | ICD-10-CM | POA: Diagnosis not present

## 2016-05-24 DIAGNOSIS — M1611 Unilateral primary osteoarthritis, right hip: Secondary | ICD-10-CM | POA: Insufficient documentation

## 2016-05-24 MED ORDER — IOPAMIDOL (ISOVUE-300) INJECTION 61%
50.0000 mL | Freq: Once | INTRAVENOUS | Status: AC | PRN
  Administered 2016-05-24: 50 mL

## 2016-05-24 MED ORDER — BUPIVACAINE HCL (PF) 0.25 % IJ SOLN
7.0000 mL | Freq: Once | INTRAMUSCULAR | Status: AC
  Administered 2016-05-24: 7 mL via INTRA_ARTICULAR
  Filled 2016-05-24: qty 10

## 2016-05-24 MED ORDER — METHYLPREDNISOLONE ACETATE 40 MG/ML INJ SUSP (RADIOLOG
80.0000 mg | Freq: Once | INTRAMUSCULAR | Status: AC
  Administered 2016-05-24: 80 mg via INTRA_ARTICULAR

## 2016-05-24 MED ORDER — LIDOCAINE HCL (PF) 1 % IJ SOLN
5.0000 mL | Freq: Once | INTRAMUSCULAR | Status: AC
  Administered 2016-05-24: 5 mL
  Filled 2016-05-24: qty 5

## 2016-05-29 ENCOUNTER — Other Ambulatory Visit: Payer: Self-pay | Admitting: Family Medicine

## 2016-05-30 NOTE — Telephone Encounter (Signed)
Please advise on refill. Cook patient  

## 2016-06-11 ENCOUNTER — Other Ambulatory Visit: Payer: Self-pay | Admitting: Family Medicine

## 2016-06-13 NOTE — Telephone Encounter (Signed)
Please contact the patient. She should no longer be using this medication. This was for an acute issue for possible kidney stone. If she is continuing to have issues with this she needs to follow-up with her PCP. Thanks.

## 2016-06-13 NOTE — Telephone Encounter (Signed)
Seen 04/22/16 for flank pain, last filled 04/22/16 30 0rf

## 2016-06-14 NOTE — Telephone Encounter (Signed)
Patient is no longer taking this and is unaware of why [facility].

## 2016-06-14 NOTE — Telephone Encounter (Signed)
Left message to return call 

## 2016-07-01 DIAGNOSIS — Z967 Presence of other bone and tendon implants: Secondary | ICD-10-CM | POA: Diagnosis not present

## 2016-07-01 DIAGNOSIS — Z8781 Personal history of (healed) traumatic fracture: Secondary | ICD-10-CM | POA: Diagnosis not present

## 2016-07-01 DIAGNOSIS — M7061 Trochanteric bursitis, right hip: Secondary | ICD-10-CM | POA: Diagnosis not present

## 2016-07-01 DIAGNOSIS — M1611 Unilateral primary osteoarthritis, right hip: Secondary | ICD-10-CM | POA: Diagnosis not present

## 2016-08-07 ENCOUNTER — Other Ambulatory Visit: Payer: Self-pay | Admitting: Family Medicine

## 2016-08-08 NOTE — Telephone Encounter (Signed)
Refilled 04/22/16. Pt last seen 04/28/16. Please advise?

## 2016-09-14 DIAGNOSIS — M1711 Unilateral primary osteoarthritis, right knee: Secondary | ICD-10-CM | POA: Insufficient documentation

## 2016-09-14 DIAGNOSIS — M170 Bilateral primary osteoarthritis of knee: Secondary | ICD-10-CM | POA: Insufficient documentation

## 2016-09-14 DIAGNOSIS — M25562 Pain in left knee: Secondary | ICD-10-CM | POA: Diagnosis not present

## 2016-09-14 DIAGNOSIS — M7061 Trochanteric bursitis, right hip: Secondary | ICD-10-CM | POA: Diagnosis not present

## 2016-10-04 ENCOUNTER — Other Ambulatory Visit: Payer: Self-pay | Admitting: Student

## 2016-10-04 DIAGNOSIS — M1611 Unilateral primary osteoarthritis, right hip: Secondary | ICD-10-CM

## 2016-10-12 DIAGNOSIS — N390 Urinary tract infection, site not specified: Secondary | ICD-10-CM | POA: Diagnosis not present

## 2016-10-12 DIAGNOSIS — A499 Bacterial infection, unspecified: Secondary | ICD-10-CM | POA: Diagnosis not present

## 2016-10-12 DIAGNOSIS — R300 Dysuria: Secondary | ICD-10-CM | POA: Diagnosis not present

## 2016-10-13 ENCOUNTER — Ambulatory Visit
Admission: RE | Admit: 2016-10-13 | Discharge: 2016-10-13 | Disposition: A | Discharge status: Home / Self Care | Payer: PPO | Source: Ambulatory Visit | Attending: Student | Admitting: Student

## 2016-10-13 DIAGNOSIS — M1611 Unilateral primary osteoarthritis, right hip: Secondary | ICD-10-CM | POA: Diagnosis not present

## 2016-10-13 MED ORDER — TRIAMCINOLONE ACETONIDE 40 MG/ML IJ SUSP
INTRAMUSCULAR | Status: AC
  Administered 2016-10-13: 40 mg
  Filled 2016-10-13: qty 1

## 2016-10-13 MED ORDER — LIDOCAINE HCL (PF) 1 % IJ SOLN
5.0000 mL | Freq: Once | INTRAMUSCULAR | Status: AC
  Administered 2016-10-13: 5 mL
  Filled 2016-10-13: qty 5

## 2016-10-13 MED ORDER — TRIAMCINOLONE ACETONIDE 40 MG/ML IJ SUSP (RADIOLOGY): 40.0000 mg | Freq: Once | INTRAMUSCULAR | Status: AC

## 2016-10-13 MED ORDER — ROPIVACAINE HCL 5 MG/ML IJ SOLN
INTRAMUSCULAR | Status: AC
  Administered 2016-10-13: 7 mL
  Filled 2016-10-13: qty 20

## 2016-10-13 MED ORDER — ROPIVACAINE HCL 5 MG/ML IJ SOLN
7.0000 mL | Freq: Once | INTRAMUSCULAR | Status: AC
  Filled 2016-10-13: qty 7

## 2016-10-13 MED ORDER — IOPAMIDOL (ISOVUE-200) INJECTION 41%
7.0000 mL | Freq: Once | INTRAVENOUS | Status: AC
  Administered 2016-10-13: 1 mL via INTRAVENOUS

## 2016-10-24 ENCOUNTER — Other Ambulatory Visit: Payer: Self-pay | Admitting: Internal Medicine

## 2016-11-05 ENCOUNTER — Other Ambulatory Visit: Payer: Self-pay | Admitting: Family Medicine

## 2016-12-08 ENCOUNTER — Other Ambulatory Visit: Payer: Self-pay | Admitting: Family Medicine

## 2017-01-18 ENCOUNTER — Other Ambulatory Visit: Payer: Self-pay | Admitting: Family Medicine

## 2017-01-18 NOTE — Telephone Encounter (Signed)
Please advise for refill, last OV was 04/28/16, last refill was 04/22/16, #30 with no refills.  thanks

## 2017-01-20 ENCOUNTER — Other Ambulatory Visit: Payer: Self-pay | Admitting: Family Medicine

## 2017-01-20 NOTE — Telephone Encounter (Signed)
[facility]

## 2017-01-20 NOTE — Telephone Encounter (Signed)
One month sent per the chart, refused this one. thanks

## 2017-01-30 DIAGNOSIS — M1711 Unilateral primary osteoarthritis, right knee: Secondary | ICD-10-CM | POA: Diagnosis not present

## 2017-01-30 DIAGNOSIS — M7061 Trochanteric bursitis, right hip: Secondary | ICD-10-CM | POA: Diagnosis not present

## 2017-04-06 ENCOUNTER — Other Ambulatory Visit: Payer: Self-pay | Admitting: Family Medicine

## 2017-04-06 NOTE — Telephone Encounter (Signed)
No appt since 2017, no follow up, please advise for refill, thanks

## 2017-04-06 NOTE — Telephone Encounter (Signed)
Please contact the patient and see if she has been taking this. It does not look like it's been refilled since 2017. She'll need a follow-up scheduled for further refills.

## 2017-04-10 NOTE — Telephone Encounter (Signed)
Patient had not been taking regularly and now has started bck on it, has no pills left.  Asked about establishing with a new provider, she would like to establish with the new internal medicine provider Dr. Dellis Anes when she comes, can you refill this till then, agreed to a appt once I can schedule on her schedule. thanks

## 2017-04-10 NOTE — Telephone Encounter (Signed)
Refill sent to pharmacy. She needs to keep follow-up with Dr. Jearld Pies.

## 2017-04-17 ENCOUNTER — Encounter: Payer: Self-pay | Admitting: Family Medicine

## 2017-04-17 ENCOUNTER — Ambulatory Visit (INDEPENDENT_AMBULATORY_CARE_PROVIDER_SITE_OTHER): Payer: PPO | Admitting: Family Medicine

## 2017-04-17 VITALS — BP 138/92 | HR 83 | Temp 98.5°F | Wt 225.6 lb

## 2017-04-17 DIAGNOSIS — I10 Essential (primary) hypertension: Secondary | ICD-10-CM | POA: Diagnosis not present

## 2017-04-17 DIAGNOSIS — B372 Candidiasis of skin and nail: Secondary | ICD-10-CM | POA: Insufficient documentation

## 2017-04-17 DIAGNOSIS — Z23 Encounter for immunization: Secondary | ICD-10-CM

## 2017-04-17 DIAGNOSIS — R109 Unspecified abdominal pain: Secondary | ICD-10-CM | POA: Diagnosis not present

## 2017-04-17 DIAGNOSIS — E669 Obesity, unspecified: Secondary | ICD-10-CM | POA: Diagnosis not present

## 2017-04-17 MED ORDER — GABAPENTIN 300 MG PO CAPS: ORAL_CAPSULE | ORAL | 2 refills | Status: DC

## 2017-04-17 MED ORDER — NYSTATIN-TRIAMCINOLONE 100000-0.1 UNIT/GM-% EX CREA: 1.0000 "application " | TOPICAL_CREAM | Freq: Two times a day (BID) | CUTANEOUS | 0 refills | Status: DC

## 2017-04-17 NOTE — Patient Instructions (Addendum)
Nice to see you. Please start using the nystatin triamcinolone cream. Please start taking the gabapentin 300 mg nightly. We will have you return in about a week for fasting lab work and recheck of your blood pressure.

## 2017-04-17 NOTE — Progress Notes (Signed)
Tommi Rumps, MD Phone: [phone]  Alice Campbell is a 66 y.o. female who presents today for follow-up.  Patient notes rash that comes and goes. She has seen dermatology for it and she reports that she was advised it was psoriasis. It is underneath her breasts and in her inguinal creases and near her vagina. She was prescribed nystatin and triamcinolone. Notes it's raw and it itches. It is somewhat painful when it is raw. No fevers. It is similar to her prior rash.  Patient has had chronic right flank pain for many years. He has been evaluated by orthopedics and urology. Urology noted per patient report that there are no urologic causes. They felt it was related to her back and a pinched nerve. She had CT abdomen pelvis previously with no cause found. Responds well to gabapentin and tramadol. She'll take 600 mg of gabapentin 3 times daily but does occur. Takes 50-100 milligrams of tramadol with the gabapentin when it occurs. This helps with her pain.  Hypertension: She is taking her medication. No chest pain or shortness of breath. Chronic intermittent lower extremity edema. No orthopnea or PND. No liver or kidney disease.  PMH: Former smoker   ROS see history of present illness  Objective  Physical Exam Vitals:   04/17/17 1312  BP: (!) 138/92  Pulse: 83  Temp: 98.5 F (36.9 C)  SpO2: 98%    BP Readings from Last 3 Encounters:  04/17/17 (!) 138/92  10/13/16 140/87  05/24/16 (!) 152/83   Wt Readings from Last 3 Encounters:  04/17/17 225 lb 9.6 oz (102.3 kg)  05/24/16 217 lb (98.4 kg)  04/28/16 220 lb 8 oz (100 kg)    Physical Exam  Constitutional: No distress.  Cardiovascular: Normal rate, regular rhythm and normal heart sounds.   Pulmonary/Chest: Effort normal and breath sounds normal.  Abdominal: Soft. Bowel sounds are normal. She exhibits no distension. There is no tenderness. There is no rebound and no guarding.  Musculoskeletal:  Slight tenderness over  anterior superior iliac spine  Neurological: She is alert.  Skin: Skin is warm and dry. She is not diaphoretic.  Beefy-red rash underneath bilateral breasts and in the inguinal creases, no warmth, no drainage     Assessment/Plan: Please see individual problem list.  Candidal intertrigo Rash consistent with candidal intertrigo. We'll treat with nystatin-triamcinolone.  HTN (hypertension) Slightly above goal. We'll recheck in one week with fasting lab work.  Right flank pain Chronic issue. Has undergone extensive evaluation. Suspect nerve impingement in her back. Trial of gabapentin daily at night. Advised not to take 600 mg 3 times a day without working her way up to that dose. Could increase if not beneficial.   Orders Placed This Encounter  Procedures  . Flu vaccine HIGH DOSE PF  . Comp Met (CMET)    Standing Status:   Future    Standing Expiration Date:   04/17/2018  . HgB A1c    Standing Status:   Future    Standing Expiration Date:   04/17/2018  . Lipid panel    Standing Status:   Future    Standing Expiration Date:   04/17/2018    Meds ordered this encounter  Medications  . DISCONTD: nystatin-triamcinolone (MYCOLOG II) cream    Sig: Apply 1 application topically 2 (two) times daily.  Marland Kitchen nystatin-triamcinolone (MYCOLOG II) cream    Sig: Apply 1 application topically 2 (two) times daily.    Dispense:  60 g    Refill:  0  .  gabapentin (NEURONTIN) 300 MG capsule    Sig: TAKE 1 CAPSULE (300 MG TOTAL) BY MOUTH AT BEDTIME.    Dispense:  90 capsule    Refill:  2   Tommi Rumps, MD [place]

## 2017-04-17 NOTE — Assessment & Plan Note (Signed)
Slightly above goal. We'll recheck in one week with fasting lab work.

## 2017-04-17 NOTE — Assessment & Plan Note (Signed)
Chronic issue. Has undergone extensive evaluation. Suspect nerve impingement in her back. Trial of gabapentin daily at night. Advised not to take 600 mg 3 times a day without working her way up to that dose. Could increase if not beneficial.

## 2017-04-17 NOTE — Assessment & Plan Note (Signed)
Rash consistent with candidal intertrigo. We'll treat with nystatin-triamcinolone.

## 2017-04-24 ENCOUNTER — Ambulatory Visit (INDEPENDENT_AMBULATORY_CARE_PROVIDER_SITE_OTHER): Payer: PPO

## 2017-04-24 ENCOUNTER — Other Ambulatory Visit (INDEPENDENT_AMBULATORY_CARE_PROVIDER_SITE_OTHER): Payer: PPO

## 2017-04-24 VITALS — BP 136/78 | HR 84 | Resp 18

## 2017-04-24 DIAGNOSIS — E669 Obesity, unspecified: Secondary | ICD-10-CM | POA: Diagnosis not present

## 2017-04-24 DIAGNOSIS — Z23 Encounter for immunization: Secondary | ICD-10-CM

## 2017-04-24 DIAGNOSIS — I10 Essential (primary) hypertension: Secondary | ICD-10-CM

## 2017-04-24 LAB — COMPREHENSIVE METABOLIC PANEL
ALBUMIN: 4 g/dL (ref 3.5–5.2)
ALT: 10 U/L (ref 0–35)
AST: 15 U/L (ref 0–37)
Alkaline Phosphatase: 85 U/L (ref 39–117)
BILIRUBIN TOTAL: 1.1 mg/dL (ref 0.2–1.2)
BUN: 14 mg/dL (ref 6–23)
CALCIUM: 9.7 mg/dL (ref 8.4–10.5)
CHLORIDE: 103 meq/L (ref 96–112)
CO2: 29 meq/L (ref 19–32)
CREATININE: 0.76 mg/dL (ref 0.40–1.20)
GFR: 80.62 mL/min (ref >60.00)
Glucose, Bld: 97 mg/dL (ref 70–99)
Potassium: 4.4 mEq/L (ref 3.5–5.1)
SODIUM: 139 meq/L (ref 135–145)
Total Protein: 6.6 g/dL (ref 6.0–8.3)

## 2017-04-24 LAB — LIPID PANEL
Cholesterol: 149 mg/dL (ref 0–200)
HDL: 53.7 mg/dL (ref >39.00)
LDL CALC: 70 mg/dL (ref 0–99)
NONHDL: 94.89
Total CHOL/HDL Ratio: 3
Triglycerides: 122 mg/dL (ref 0.0–149.0)
VLDL: 24.4 mg/dL (ref 0.0–40.0)

## 2017-04-24 LAB — HEMOGLOBIN A1C: Hgb A1c MFr Bld: 5.2 % (ref 4.6–6.5)

## 2017-04-24 NOTE — Progress Notes (Signed)
Patient came in for a BP check, had come in last week and it was high at her visit, currently takes amlodipine in the evenings 5 mg.  Last took last night.  She doesn't check her BP readings at home.  Checked in bilateral upper extremities.  See vitals for details.  Patient also had lab work this am.  Please advise. thanks

## 2017-04-24 NOTE — Progress Notes (Signed)
Improved from prior. Patient should start checking at home if able and follow up in 3 months for repeat check.

## 2017-04-25 NOTE — Progress Notes (Signed)
Left a VM to follow up in 3 months. thanks

## 2017-05-04 ENCOUNTER — Other Ambulatory Visit: Payer: Self-pay | Admitting: Family Medicine

## 2017-05-08 ENCOUNTER — Other Ambulatory Visit: Payer: Self-pay | Admitting: Family Medicine

## 2017-05-08 NOTE — Telephone Encounter (Signed)
Patient states she has enough until DR.Birdie SonsSonnenberg returns, patient states she would like to increase her gabapentin, she states it is controlling the pain at night but she has pain in the morning. Patient would like to start taking this in the morning and at night. If ok to increase, patient will need refill on gabapentin Last filled trazodone 04/10/17 30 0rf Last OV 04/17/17

## 2017-05-23 MED ORDER — TRAZODONE HCL 50 MG PO TABS: 50.0000 mg | ORAL_TABLET | Freq: Every evening | ORAL | 2 refills | Status: DC | PRN

## 2017-05-23 MED ORDER — GABAPENTIN 300 MG PO CAPS: 300.0000 mg | ORAL_CAPSULE | Freq: Two times a day (BID) | ORAL | 2 refills | Status: DC

## 2017-05-23 NOTE — Telephone Encounter (Signed)
Okay to increase gabapentin to 300 mg in the morning.  If this makes her drowsy she should let us know.  Trazodone refilled.  Thanks.

## 2017-05-24 ENCOUNTER — Other Ambulatory Visit: Payer: Self-pay | Admitting: Family Medicine

## 2017-05-24 NOTE — Addendum Note (Signed)
Addended by: Inetta FermoHENDRICKS, Anysa Tacey S on: 05/24/2017 02:51 PM   Modules accepted: Orders

## 2017-05-24 NOTE — Telephone Encounter (Signed)
Patient notified and states she will let us know and she will start gabapentin 300 mg in the am and at night. Patient would also like a refill on tramadol Last OV 04/17/17 last filled 01/20/17 30 0rf

## 2017-05-24 NOTE — Telephone Encounter (Signed)
What medication needs to be refilled? Thanks.

## 2017-05-24 NOTE — Telephone Encounter (Signed)
Last office visit 04/17/17 Next office visit 08/12/17

## 2017-05-24 NOTE — Telephone Encounter (Signed)
Please find out if she takes the tramadol for the same issue as the gabapentin.

## 2017-05-25 ENCOUNTER — Other Ambulatory Visit: Payer: Self-pay | Admitting: Family Medicine

## 2017-05-25 NOTE — Telephone Encounter (Signed)
Left message to return call to ask what she takes the tramadol for and what she takes the gabapentin for, ok for pec to speak to patient

## 2017-05-25 NOTE — Telephone Encounter (Signed)
Ultram refill  

## 2017-05-26 ENCOUNTER — Other Ambulatory Visit: Payer: Self-pay | Admitting: *Deleted

## 2017-05-26 NOTE — Telephone Encounter (Signed)
Patient reports Dr. Birdie SonsSonnenberg increased Gabapentin to 1 tab in am and 1 tab in the evening.  She takes Tramadol prn for the same nerve pain from her groin to her back.

## 2017-05-26 NOTE — Telephone Encounter (Signed)
Attempted to return call to pt. To discuss Tramadol and Gabapentin.  Left message to call back.    Copied from CRM [id number]. Topic: Quick Communication - See Telephone Encounter >> May 25, 2017 12:27 PM Inetta FermoHendricks, Jessica S, CMA wrote: CRM for notification. See Telephone encounter for: Left message to return call to ask what she takes the tramadol for and what she takes the gabapentin for, ok for pec to speak to patient   >> May 26, 2017  8:47 AM Cipriano BunkerLambe, Annette S wrote: Patient called back.  Nurses line was busy. Please call back [phone] There is a CRM that PEC can speak to her.

## 2017-05-26 NOTE — Telephone Encounter (Signed)
Patient needs refill on Tramadol.  She takes this medication prn for the same nerve pain that she takes the Gabapentin for.  Last refill was 01/20/17.

## 2017-05-26 NOTE — Telephone Encounter (Signed)
faxed

## 2017-06-26 ENCOUNTER — Other Ambulatory Visit: Payer: Self-pay | Admitting: Family Medicine

## 2017-06-29 NOTE — Telephone Encounter (Signed)
Okay to refill Tramadol? LOV: 04/17/17 NOV: 08/02/17

## 2017-06-30 NOTE — Telephone Encounter (Signed)
Rx faxed to [facility] 

## 2017-07-20 ENCOUNTER — Encounter: Payer: Self-pay | Admitting: Family Medicine

## 2017-07-20 ENCOUNTER — Ambulatory Visit (INDEPENDENT_AMBULATORY_CARE_PROVIDER_SITE_OTHER): Payer: PPO | Admitting: Family Medicine

## 2017-07-20 VITALS — BP 130/80 | HR 79 | Temp 98.4°F | Resp 18 | Ht 65.0 in | Wt 224.1 lb

## 2017-07-20 DIAGNOSIS — N3090 Cystitis, unspecified without hematuria: Secondary | ICD-10-CM

## 2017-07-20 DIAGNOSIS — R109 Unspecified abdominal pain: Secondary | ICD-10-CM

## 2017-07-20 LAB — POCT URINALYSIS DIPSTICK
Bilirubin, UA: NEGATIVE
Blood, UA: 2
Glucose, UA: NEGATIVE
KETONES UA: NEGATIVE
NITRITE UA: NEGATIVE
PH UA: 6 (ref 5.0–8.0)
PROTEIN UA: 1
SPEC GRAV UA: 1.025 (ref 1.010–1.025)
UROBILINOGEN UA: 1 U/dL

## 2017-07-20 MED ORDER — SULFAMETHOXAZOLE-TRIMETHOPRIM 800-160 MG PO TABS: 1.0000 | ORAL_TABLET | Freq: Two times a day (BID) | ORAL | 0 refills | Status: DC

## 2017-07-20 NOTE — Progress Notes (Signed)
Subjective:    Patient ID: CECYLIA BRAZILL, female    DOB: [birth year], 67 y.o.   MRN: [id number]  HPI  Ms. Koffler is a 67 year old female who presents today with right flank pain that she reports has worsened approximately 3 to 4 days ago. She has a history of right flank pain has been present chronically over the past 7 years with intermittent episodes where pain worsens per patient.. This is noted as a chronic issue that has been evaluated extensively with suspected nerve impingement in her back. She is taking gabapentin for this which has provided benefit for this chronic concern. Pain is described as ranging from as high as a 10 but improves to 0 and is described as aching at this tim.  She denies dysuria, frequency, urgency, and hematuria. No history of kidney stones. She denies fever, chills, sweats, N/V.    She reports that flank pain improves with gabapentin 600 mg BID and with tramadol. This has been completed and she reports improvement with flank pain however wants to be evaluated as this has been noted as worsening over the past 3 to 4 days. PCP noted and patient stated that this has been ongoing and she has been extensively evaluated by urology and orthopedics. Urology did not find a urologic cause.  Her chart also reflects that a previous CT abdomen pelvis was completed with no cause found for this pain.   Review of Systems  Constitutional: Negative for chills, fatigue and fever.  Respiratory: Negative for cough, shortness of breath and wheezing.   Cardiovascular: Negative for chest pain and palpitations.  Gastrointestinal: Negative for abdominal pain.  Genitourinary: Negative for dysuria and frequency.  Musculoskeletal: Negative for myalgias.       Right flank pain  Neurological: Negative for dizziness, weakness, light-headedness and headaches.  Psychiatric/Behavioral:       Denies depressed or anxious mood today   Past Medical History:  Diagnosis Date  . Arthritis   .  Chicken pox   . GERD (gastroesophageal reflux disease)   . History of UTI    [facility]  . Hyperlipidemia   . Hypertension      Social History   Socioeconomic History  . Marital status: Married    Spouse name: Not on file  . Number of children: Not on file  . Years of education: 27  . Highest education level: Not on file  Social Needs  . Financial resource strain: Not on file  . Food insecurity - worry: Not on file  . Food insecurity - inability: Not on file  . Transportation needs - medical: Not on file  . Transportation needs - non-medical: Not on file  Occupational History  . Occupation: Paramedic Work    Comment: Semi-retired  Tobacco Use  . Smoking status: Former Smoker    Last attempt to quit: 01/19/1996    Years since quitting: 21.5  . Smokeless tobacco: Never Used  . Tobacco comment: quit in 2000  Substance and Sexual Activity  . Alcohol use: Yes    Alcohol/week: 1.0 - 2.0 oz    Types: 2 - 4 Standard drinks or equivalent per week    Comment: few drinks a week   . Drug use: No  . Sexual activity: Not on file  Other Topics Concern  . Not on file  Social History Narrative   Mrs. Monrroy grew up in [place], [place]. She is currently living in [place] with her husband. She enjoys swimming and reading.  Past Surgical History:  Procedure Laterality Date  . BIOPSY THYROID  07/2013  . CHOLECYSTECTOMY  2011  . FRACTURE SURGERY Right    Hip    Family History  Problem Relation Age of Onset  . Heart disease Mother   . Hypertension Mother   . Mental illness Mother        Nervous breakdown   . Cancer Father        Prostate Cancer  . Heart disease Father   . Hypertension Father     No Known Allergies  Current Outpatient Medications on File Prior to Visit  Medication Sig Dispense Refill  . amLODipine (NORVASC) 5 MG tablet TAKE 1 TABLET (5 MG TOTAL) BY MOUTH DAILY. 90 tablet 2  . atorvastatin (LIPITOR) 20 MG tablet TAKE 1 TABLET (20 MG TOTAL) BY MOUTH  DAILY. 90 tablet 2  . gabapentin (NEURONTIN) 300 MG capsule Take 1 capsule (300 mg total) by mouth 2 (two) times daily. 180 capsule 2  . nystatin cream (MYCOSTATIN) APPLY 1 APPLICATION TOPICALLY TWICE A DAY (*MIX WITH TRIAMCINOLONE CREAM... FOR INS. COVERAGE) 30 g 0  . nystatin-triamcinolone (MYCOLOG II) cream Apply 1 application topically 2 (two) times daily. 60 g 0  . traMADol (ULTRAM) 50 MG tablet TAKE 1 TABLET BY MOUTH EVERY 8 HOURS AS NEEDED 30 tablet 0  . traZODone (DESYREL) 50 MG tablet Take 1 tablet (50 mg total) by mouth at bedtime as needed for sleep. 30 tablet 2  . triamcinolone cream (KENALOG) 0.1 % APPLY 1 APPLICATION TOPICALLY TWICE A DAY (MIX WITH NYSTATIN CREAM) 30 g 0   No current facility-administered medications on file prior to visit.     BP 130/80 (BP Location: Left Arm, Patient Position: Sitting, Cuff Size: Large)   Pulse 79   Temp 98.4 F (36.9 C) (Oral)   Resp 18   Ht 5\' 5"  (1.651 m)   Wt 224 lb 2 oz (101.7 kg)   SpO2 97%   BMI 37.30 kg/m       Objective:   Physical Exam  Constitutional: She is oriented to person, place, and time. She appears well-developed and well-nourished.  overweight  Eyes: Pupils are equal, round, and reactive to light. No scleral icterus.  Neck: Neck supple.  Cardiovascular: Normal rate and regular rhythm.  Pulmonary/Chest: Effort normal and breath sounds normal. She has no wheezes. She has no rales.  Abdominal: Soft. Bowel sounds are normal. There is no rigidity, no rebound, no guarding, no CVA tenderness, no tenderness at McBurney's point and negative Murphy's sign.  Musculoskeletal:  Spine with normal alignment and no deformity. No tenderness to vertebral process with palpation with the exception of mild tenderness around L5.  Paraspinous muscles are mildly tender. Straight Leg raise. No CVA tenderness present. Ambulates with a cane; no heel/toe walk completed   Lymphadenopathy:    She has no cervical adenopathy.  Neurological:  She is alert and oriented to person, place, and time. Coordination normal.  Skin: Skin is warm and dry. No rash noted.  Psychiatric: She has a normal mood and affect. Her behavior is normal. Judgment and thought content normal.       Assessment & Plan:  1. Cystitis UA + leukocytes, negative for nitrites. Suspect that symptoms are related to UTI and that flank pain is chronic in nature.  Advised patient to complete antibiotic and she should follow up if her symptoms do not improve in 2 to 3 days, worsen, she develops a fever >101, or worsening  back pain. Also advised her to force fluids, especially water and to avoid tea in lieu of water. Further discussed with patient the importance of close follow up and return for care if symptoms do not improve with treatment, worsen, or she develops new symptoms such as fever, or increasing pain, she will seek immediate medical attention.  Patient voiced understanding and agreed with plan.  2. Flank pain Reviewed adherence and proper dosing of gabapentin and tramadol. Advised patient to follow PCP's recommendations and she can use acetaminophen for discomfort. Further advised her to schedule a follow up appointment with her PCP to discuss long term management of this chronic issue.  Appointment was scheduled today.  - POCT Urinalysis Dipstick -Urine Culture  Roddie Mc, FNP-C

## 2017-07-20 NOTE — Progress Notes (Signed)
Pre-visit discussion using our clinic review tool. No additional management support is needed unless otherwise documented below in the visit note.  

## 2017-07-20 NOTE — Patient Instructions (Signed)
Please take medication as directed with food. Please complete antibiotic as directed. If you develop symptoms of fever >101, pain in your back that is worsening and different that your chronic back pain, or do not improve with treatment that has been provided in 3 to 4 days, please follow up for further evaluation and treatment.    Urinary Tract Infection, Adult A urinary tract infection (UTI) is an infection of any part of the urinary tract. The urinary tract includes the:  Kidneys.  Ureters.  Bladder.  Urethra.  These organs make, store, and get rid of pee (urine) in the body. Follow these instructions at home:  Take over-the-counter and prescription medicines only as told by your doctor.  If you were prescribed an antibiotic medicine, take it as told by your doctor. Do not stop taking the antibiotic even if you start to feel better.  Avoid the following drinks: ? Alcohol. ? Caffeine. ? Tea. ? Carbonated drinks.  Drink enough fluid to keep your pee clear or pale yellow.  Keep all follow-up visits as told by your doctor. This is important.  Make sure to: ? Empty your bladder often and completely. Do not to hold pee for long periods of time. ? Empty your bladder before and after sex. ? Wipe from front to back after a bowel movement if you are female. Use each tissue one time when you wipe. Contact a doctor if:  You have back pain.  You have a fever.  You feel sick to your stomach (nauseous).  You throw up (vomit).  Your symptoms do not get better after 3 days.  Your symptoms go away and then come back. Get help right away if:  You have very bad back pain.  You have very bad lower belly (abdominal) pain.  You are throwing up and cannot keep down any medicines or water. This information is not intended to replace advice given to you by your health care provider. Make sure you discuss any questions you have with your health care provider. Document Released:  11/30/2007 Document Revised: 11/19/2015 Document Reviewed: 05/04/2015 Elsevier Interactive Patient Education  [employer]2018 Elsevier Inc.

## 2017-07-23 LAB — URINE CULTURE
MICRO NUMBER:: 90102251
SPECIMEN QUALITY: ADEQUATE

## 2017-07-24 NOTE — Progress Notes (Signed)
She should continue gabapentin as prescribed and she can add acetaminophen. This appears to be a chronic issue and follow up with her PCP next week as scheduled is recommended. As pain is better, she can contact PCP if pain occurs again and discuss continuation of Tramadol

## 2017-07-25 ENCOUNTER — Telehealth: Payer: Self-pay

## 2017-07-25 ENCOUNTER — Other Ambulatory Visit: Payer: Self-pay

## 2017-07-25 NOTE — Telephone Encounter (Signed)
Last OV 04/17/17 last filled by Dr.Cook 10/24/16 90 2rf

## 2017-07-25 NOTE — Telephone Encounter (Signed)
Copied from CRM [id number]. Topic: Inquiry >> Jul 25, 2017 12:31 PM Alexander BergeronBarksdale, Harvey B wrote: Reason for CRM: pt called and stated that she spoke w/ someone yesterday and was told that she had a UTI but then was notified later that she DOES NOT have an UTI, pt states she has some questions that she would like to go over w/ the nurse, contact pt to advise

## 2017-07-25 NOTE — Telephone Encounter (Signed)
Talked with patient see result note.

## 2017-07-26 MED ORDER — ATORVASTATIN CALCIUM 20 MG PO TABS: ORAL_TABLET | ORAL | 2 refills | Status: DC

## 2017-08-01 ENCOUNTER — Encounter: Payer: Self-pay | Admitting: Family Medicine

## 2017-08-01 ENCOUNTER — Ambulatory Visit (INDEPENDENT_AMBULATORY_CARE_PROVIDER_SITE_OTHER): Payer: PPO | Admitting: Family Medicine

## 2017-08-01 ENCOUNTER — Ambulatory Visit (INDEPENDENT_AMBULATORY_CARE_PROVIDER_SITE_OTHER): Payer: PPO

## 2017-08-01 VITALS — BP 130/60 | HR 94 | Temp 98.7°F | Wt 223.0 lb

## 2017-08-01 DIAGNOSIS — R102 Pelvic and perineal pain: Secondary | ICD-10-CM | POA: Diagnosis not present

## 2017-08-01 DIAGNOSIS — G8929 Other chronic pain: Secondary | ICD-10-CM

## 2017-08-01 DIAGNOSIS — B372 Candidiasis of skin and nail: Secondary | ICD-10-CM | POA: Diagnosis not present

## 2017-08-01 DIAGNOSIS — R109 Unspecified abdominal pain: Secondary | ICD-10-CM

## 2017-08-01 DIAGNOSIS — M1990 Unspecified osteoarthritis, unspecified site: Secondary | ICD-10-CM | POA: Insufficient documentation

## 2017-08-01 DIAGNOSIS — R21 Rash and other nonspecific skin eruption: Secondary | ICD-10-CM

## 2017-08-01 DIAGNOSIS — M1651 Unilateral post-traumatic osteoarthritis, right hip: Secondary | ICD-10-CM

## 2017-08-01 DIAGNOSIS — Z1239 Encounter for other screening for malignant neoplasm of breast: Secondary | ICD-10-CM

## 2017-08-01 DIAGNOSIS — Z1231 Encounter for screening mammogram for malignant neoplasm of breast: Secondary | ICD-10-CM

## 2017-08-01 MED ORDER — TRAMADOL HCL 50 MG PO TABS: 50.0000 mg | ORAL_TABLET | Freq: Three times a day (TID) | ORAL | 0 refills | Status: DC | PRN

## 2017-08-01 NOTE — Assessment & Plan Note (Signed)
Reports she was told this was psoriasis many topical Mycolog for numerous years.  It comes and goes.  Given persistence we will have her see a new dermatologist.

## 2017-08-01 NOTE — Patient Instructions (Addendum)
Nice to see you. We will set you up for some imaging test to evaluate your discomfort. If you have worsening symptoms please be reevaluated.

## 2017-08-01 NOTE — Progress Notes (Signed)
Marikay Alar, MD Phone: [phone]  Alice Campbell is a 67 y.o. female who presents today for follow-up.  Patient reports chronic issues with right back and right flank pain as well as right groin pain.  Notes her pain typically starts in the groin and moves to her right flank and then back.  She notes if she catches it early with gabapentin and tramadol she will not have too much pain though if she gets into a spell she has fairly significant pain.  She was seen several weeks ago by 1 of our nurse practitioners and diagnosed with a possible UTI the urine culture was negative.  The patient notes no fevers.  No hematuria.  She has been evaluated by urology and orthopedics through the years with no obvious cause.  At some point in the past it was mentioned by some provider that her pain may be related to nerve impingement.  She is status post cholecystectomy.  Does note the pain started after her gallbladder was taken out.  She has had CT abdomen and pelvis with and without contrast as well as CT chest and MRI right hip.  No obvious cause for the discomfort on CT abdomen and pelvis or CT chest.  Her right hip MRI does reveal severe arthritis.  Social History   Tobacco Use  Smoking Status Former Smoker  . Last attempt to quit: 01/19/1996  . Years since quitting: 21.5  Smokeless Tobacco Never Used  Tobacco Comment   quit in 2000     ROS see history of present illness  Objective  Physical Exam Vitals:   08/01/17 1509  BP: 130/60  Pulse: 94  Temp: 98.7 F (37.1 C)  SpO2: 97%    BP Readings from Last 3 Encounters:  08/01/17 130/60  07/20/17 130/80  04/24/17 136/78   Wt Readings from Last 3 Encounters:  08/01/17 223 lb (101.2 kg)  07/20/17 224 lb 2 oz (101.7 kg)  04/17/17 225 lb 9.6 oz (102.3 kg)    Physical Exam  Constitutional: No distress.  Cardiovascular: Normal rate, regular rhythm and normal heart sounds.  Pulmonary/Chest: Effort normal and breath sounds normal.    Abdominal: Soft. Bowel sounds are normal. She exhibits no distension. There is no tenderness. There is no rebound and no guarding.  Genitourinary:  Genitourinary Comments: Patient with slight external labial beefy erythema consistent with partially treated candidal intertrigo, normal vaginal mucosa, normal cervix, no adnexal masses bilaterally, no left adnexal tenderness, possibly right adnexal tenderness though most of her tenderness is over the pubic bone on the right side with no palpable mass, she has slight discomfort on palpation over her right hip as well, slight tenderness of the lumbar right-sided musculature as well, she has no skin changes over her abdomen or right hip  Musculoskeletal: She exhibits no edema.  Neurological: She is alert.  Skin: Skin is warm and dry. She is not diaphoretic.     Assessment/Plan: Please see individual problem list.  Right flank pain Chronic intermittent issue with right flank pain as well as pubic area discomfort and right hip discomfort.  She has been evaluated extensively with imaging previously with no obvious cause.  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  She will discuss with her orthopedist at her next visit.  Given her pubic area pain we will obtain ultrasound to evaluate her pelvis.  Symptoms somewhat seem consistent with a kidney stone  though prior CT scans have not revealed this.  Patient is hesitant to undergo another CT scan due to radiation risk though is acceptable of having a plain film today.  She will continue gabapentin at her current dose and tramadol at her current dose for discomfort.  Once this evaluation is completed we will determine the next step.  She is given return precautions.  Candidal intertrigo Reports she was told this was psoriasis many topical Mycolog for numerous years.  It comes and goes.  Given persistence we will have her see a new  dermatologist.  Osteoarthritis I suspect this is contributing to some of her issues.  She will see her orthopedist.   Orders Placed This Encounter  Procedures  . MM Digital Screening    Standing Status:   Future    Standing Expiration Date:   09/30/2018    Order Specific Question:   Reason for Exam (SYMPTOM  OR DIAGNOSIS REQUIRED)    Answer:   screening    Order Specific Question:   Preferred imaging location?    Answer:   [facility]  . DG Abd 1 View    Standing Status:   Future    Number of Occurrences:   1    Standing Expiration Date:   09/30/2018    Order Specific Question:   Reason for Exam (SYMPTOM  OR DIAGNOSIS REQUIRED)    Answer:   chronic flank pain radiating to groin    Order Specific Question:   Preferred imaging location?    Answer:   [facility]    Order Specific Question:   Radiology Contrast Protocol - do NOT remove file path    Answer:   [email or web address].pdf  . US Transvaginal Non-OB    Standing Status:   Future    Standing Expiration Date:   09/30/2018    Order Specific Question:   Reason for Exam (SYMPTOM  OR DIAGNOSIS REQUIRED)    Answer:   chronic right pelvic pain    Order Specific Question:   Preferred imaging location?    Answer:   [facility]  . US Pelvis Complete    Standing Status:   Future    Standing Expiration Date:   09/30/2018    Order Specific Question:   Reason for Exam (SYMPTOM  OR DIAGNOSIS REQUIRED)    Answer:   chronic right pelvic pain    Order Specific Question:   Preferred imaging location?    Answer:   [facility]  . Ambulatory referral to Dermatology    Referral Priority:   Routine    Referral Type:   Consultation    Referral Reason:   Specialty Services Required    Requested Specialty:   Dermatology    Number of Visits Requested:   1    Meds ordered this encounter  Medications  . traMADol (ULTRAM) 50 MG tablet    Sig: Take 1 tablet (50 mg total) by mouth every 8  (eight) hours as needed.    Dispense:  30 tablet    Refill:  0    Not to exceed 5 additional fills before 11/22/2017     Marikay AlarEric Chrystie Hagwood, MD [facility]

## 2017-08-01 NOTE — Assessment & Plan Note (Signed)
I suspect this is contributing to some of her issues.  She will see her orthopedist.

## 2017-08-01 NOTE — Assessment & Plan Note (Signed)
Chronic intermittent issue with right flank pain as well as pubic area discomfort and right hip discomfort.  She has been evaluated extensively with imaging previously with no obvious cause.  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  She will discuss with her orthopedist at her next visit.  Given her pubic area pain we will obtain ultrasound to evaluate her pelvis.  Symptoms somewhat seem consistent with a kidney stone though prior CT scans have not revealed this.  Patient is hesitant to undergo another CT scan due to radiation risk though is acceptable of having a plain film today.  She will continue gabapentin at her current dose and tramadol at her current dose for discomfort.  Once this evaluation is completed we will determine the next step.  She is given return precautions.

## 2017-08-02 ENCOUNTER — Ambulatory Visit: Payer: Self-pay | Admitting: Family Medicine

## 2017-08-09 ENCOUNTER — Ambulatory Visit: Payer: PPO

## 2017-08-14 ENCOUNTER — Other Ambulatory Visit: Payer: Self-pay | Admitting: Family Medicine

## 2017-08-14 DIAGNOSIS — R21 Rash and other nonspecific skin eruption: Secondary | ICD-10-CM | POA: Diagnosis not present

## 2017-08-14 DIAGNOSIS — L400 Psoriasis vulgaris: Secondary | ICD-10-CM | POA: Diagnosis not present

## 2017-08-15 ENCOUNTER — Ambulatory Visit
Admission: RE | Admit: 2017-08-15 | Discharge: 2017-08-15 | Disposition: A | Discharge status: Home / Self Care | Payer: PPO | Source: Ambulatory Visit | Attending: Family Medicine | Admitting: Family Medicine

## 2017-08-15 DIAGNOSIS — R102 Pelvic and perineal pain: Secondary | ICD-10-CM | POA: Diagnosis not present

## 2017-08-15 DIAGNOSIS — N858 Other specified noninflammatory disorders of uterus: Secondary | ICD-10-CM | POA: Insufficient documentation

## 2017-08-16 NOTE — Telephone Encounter (Signed)
Last OV 08/01/17 last filled 05/23/17 30 2rf

## 2017-08-18 ENCOUNTER — Other Ambulatory Visit: Payer: Self-pay | Admitting: Family Medicine

## 2017-08-18 ENCOUNTER — Telehealth: Payer: Self-pay | Admitting: Family Medicine

## 2017-08-18 DIAGNOSIS — N9489 Other specified conditions associated with female genital organs and menstrual cycle: Secondary | ICD-10-CM

## 2017-08-18 NOTE — Telephone Encounter (Signed)
Please advise 

## 2017-08-18 NOTE — Telephone Encounter (Signed)
Patient spoke to Dr.Sonnenberg 

## 2017-08-18 NOTE — Telephone Encounter (Signed)
Copied from CRM [id number]. Topic: Quick Communication - Office Called Patient >> Aug 18, 2017  2:21 PM Louie BunPalacios Medina, Rosey Batheresa D wrote: Reason for CRM: Patient called and was returning office's missed called she had. NO VM was left and I could not find who called her. Please call patient back, thanks

## 2017-08-21 ENCOUNTER — Telehealth: Payer: Self-pay | Admitting: Family Medicine

## 2017-08-21 NOTE — Telephone Encounter (Signed)
Patient asked what canal it was that had fluid, I informed patient it was the endometrial canal

## 2017-08-21 NOTE — Telephone Encounter (Signed)
Patient would like to speak to nurse regarding results, she was so upset she can't remember everything. Call back #[phone]

## 2017-08-21 NOTE — Telephone Encounter (Signed)
Fyi.

## 2017-08-21 NOTE — Telephone Encounter (Signed)
Copied from CRM [id number]. Topic: Quick Communication - See Telephone Encounter >> Aug 21, 2017  9:59 AM Rudi CocoLathan, Terrill Wauters M, NT wrote: CRM for notification. See Telephone encounter for:   08/21/17. Pt. Calling back to let Dr. Birdie SonsSonnenberg know that she would like to see Dr. Elesa [facility]Ward obgyn at [facility].

## 2017-08-22 NOTE — Telephone Encounter (Signed)
I will forward to Rasheedah to get this set up. Referral to GYN already placed.

## 2017-08-22 NOTE — Progress Notes (Signed)
Ok. [facility] via [facility] on 08/18/2017.

## 2017-08-24 NOTE — Telephone Encounter (Signed)
Patient called stating that she had requested to see Dr Elesa MassedWard at the [facility] but she had received a call from [facility] she stated that that's not where she had wanted to go and wanted to know if the practice had even attempted to schedule her with Dr Elesa MassedWard patient  [facility] but if you can get her in with Ward by next Thursday she would Cancell her appt with them please give pt a call back to let her know

## 2017-08-24 NOTE — Telephone Encounter (Signed)
Could you change her appointment to Dr[facility]Ward at [facility]

## 2017-08-28 ENCOUNTER — Telehealth: Payer: Self-pay | Admitting: Family Medicine

## 2017-08-28 NOTE — Telephone Encounter (Signed)
Pt called back in to follow up on referral, she would like to make sure that her referral is being set up at [facility] with Dr. Elesa Massed.    Please confirm with pt as she has called in several time to be sure.    CB: [phone]

## 2017-08-28 NOTE — Telephone Encounter (Signed)
Please advise 

## 2017-08-28 NOTE — Telephone Encounter (Signed)
Copied from CRM [phone]. Topic: Quick Communication - See Telephone Encounter >> Aug 28, 2017  2:19 PM Herby AbrahamJohnson, Shiquita C wrote: CRM for notification. See Telephone encounter for: pt called in to be advised. Pt says that she was prescribed Tramadol, medication isn't working, pt would like to know if provider could prescribe something else to help with her pain?   08/28/17.

## 2017-08-29 NOTE — Telephone Encounter (Signed)
We could try a higher dose of tramadol. I can send this in if she would like to try this. Given chronicity if she needs stronger medication than tramadol we will need to refer to pain management. Thanks.

## 2017-08-30 MED ORDER — TRAMADOL HCL 50 MG PO TABS: 100.0000 mg | ORAL_TABLET | Freq: Three times a day (TID) | ORAL | 0 refills | Status: DC | PRN

## 2017-08-30 NOTE — Telephone Encounter (Signed)
I spoke with the patient. She wants to see Dr.Ward ,but was questioning if they would take her insurance. I called [facility] they will except her insurance ,however the physician is booked until they end of April . The person I spoke with took a message to see if Dr. Elesa MassedWard could work the patient this week. She will call the patient if she can work her in. I informed the patient to keep the appointment that she has with [facility] until she receives a call back.

## 2017-08-30 NOTE — Telephone Encounter (Signed)
Sent to pharmacy 

## 2017-08-30 NOTE — Telephone Encounter (Signed)
Pt would like call back regarding why she can not see Dr Elesa [facility]ard at [facility]. Please call.  [facility] and she is not sure if she should go or not.

## 2017-08-30 NOTE — Telephone Encounter (Signed)
Patient states she would like to try the stronger dose of tramadol. She would like this to be sent to [facility] [address]

## 2017-08-31 ENCOUNTER — Ambulatory Visit (INDEPENDENT_AMBULATORY_CARE_PROVIDER_SITE_OTHER): Payer: PPO | Admitting: Obstetrics & Gynecology

## 2017-08-31 ENCOUNTER — Encounter: Payer: Self-pay | Admitting: Obstetrics & Gynecology

## 2017-08-31 VITALS — BP 150/90 | HR 102 | Ht 66.0 in | Wt 220.0 lb

## 2017-08-31 DIAGNOSIS — R102 Pelvic and perineal pain: Secondary | ICD-10-CM | POA: Insufficient documentation

## 2017-08-31 DIAGNOSIS — N840 Polyp of corpus uteri: Secondary | ICD-10-CM | POA: Diagnosis not present

## 2017-08-31 DIAGNOSIS — R9389 Abnormal findings on diagnostic imaging of other specified body structures: Secondary | ICD-10-CM | POA: Insufficient documentation

## 2017-08-31 DIAGNOSIS — N858 Other specified noninflammatory disorders of uterus: Secondary | ICD-10-CM | POA: Diagnosis not present

## 2017-08-31 NOTE — Patient Instructions (Signed)

## 2017-08-31 NOTE — Progress Notes (Signed)
Endometrial Thickening and Pelvic Pain Patient complains of 7 year intermittant but worsening vaginal pain that radiates to the right side and back, assoc w nausea at times, intermittant and severe at times, modifiers include rest, and nothing makes worse.  Has been seen by PCP and Urology with no success at finding etiology.  She has been menopausal for several years. Currently on no HRT.  No bleeding. Other menopausal symptoms include: vaginal dryness (and also has Psoriasis that affects labia)). Workup to date: pelvic ultrasound.  US Transvaginal Non-ob Result Date: 08/15/2017 CLINICAL DATA:  Chronic pelvic pain EXAM: TRANSABDOMINAL AND TRANSVAGINAL ULTRASOUND OF PELVIS TECHNIQUE: Both transabdominal and transvaginal ultrasound examinations of the pelvis were performed. Transabdominal technique was performed for global imaging of the pelvis including uterus, ovaries, adnexal regions, and pelvic cul-de-sac. It was necessary to proceed with endovaginal exam following the transabdominal exam to visualize the endometrium and ovaries. COMPARISON:  None FINDINGS: Uterus Measurements: 6.6 x 3.4 x 3.9 cm. No fibroids or other mass visualized. Endometrium Thickness: 5.8 mm. Complex fluid present within the endometrium. Focal hyperechoic hypovascular endometrial mass measuring 1.2 x 0.5 x 0.6 cm Right ovary Measurements: 1.3 x 0.9 x 1.3 cm. Normal appearance/no adnexal mass. Left ovary Measurements: 1.6 x 1.4 x 1.3 cm. Normal appearance/no adnexal mass. Other findings No abnormal free fluid. IMPRESSION: 1. Complex fluid present within the endometrial canal. Additional 1.2 cm hyperechoic endometrial mass is also visualized. A focal endometrial lesion is suspected. Consider sonohysterogram for further evaluation, prior to hysteroscopy or endometrial biopsy. 2. Otherwise no other abnormality is seen. Electronically Signed   By: Jasmine Pang M.D.   On: 08/15/2017 19:59   Menstrual History: OB History    Gravida Para  Term Preterm AB Living   2 2 2     2    SAB TAB Ectopic Multiple Live Births                 PMHx: She  has a past medical history of Arthritis, Chicken pox, GERD (gastroesophageal reflux disease), History of UTI, Hyperlipidemia, and Hypertension. Also,  has a past surgical history that includes Cholecystectomy (2011); Biopsy thyroid (07/2013); and Fracture surgery (Right)., family history includes Cancer in her father; Heart disease in her father and mother; Hypertension in her father and mother; Mental illness in her mother.,  reports that she quit smoking about 21 years ago. she has never used smokeless tobacco. She reports that she drinks about 1.0 - 2.0 oz of alcohol per week. She reports that she does not use drugs.  She has a current medication list which includes the following prescription(s): amlodipine, atorvastatin, gabapentin, nystatin cream, nystatin-triamcinolone, tramadol, trazodone, and triamcinolone cream. Also, has No Known Allergies.  Review of Systems  Constitutional: Negative for chills, fever and malaise/fatigue.  HENT: Negative for congestion, sinus pain and sore throat.   Eyes: Negative for blurred vision and pain.  Respiratory: Negative for cough and wheezing.   Cardiovascular: Negative for chest pain and leg swelling.  Gastrointestinal: Positive for nausea and vomiting. Negative for abdominal pain, constipation, diarrhea and heartburn.  Genitourinary: Positive for flank pain. Negative for dysuria, frequency, hematuria and urgency.  Musculoskeletal: Positive for joint pain. Negative for back pain, myalgias and neck pain.  Skin: Negative for itching and rash.  Neurological: Negative for dizziness, tremors and weakness.  Endo/Heme/Allergies: Does not bruise/bleed easily.  Psychiatric/Behavioral: Negative for depression. The patient is not nervous/anxious and does not have insomnia.     Objective: BP (!) 150/90  Pulse (!) 102   Ht 5\' 6"  (1.676 m)   Wt 220 lb (99.8  kg)   BMI 35.51 kg/m  Physical Exam  Constitutional: She is oriented to person, place, and time. She appears well-developed and well-nourished. No distress.  Genitourinary: Vagina normal and uterus normal. Pelvic exam was performed with patient supine. There is no rash, tenderness or lesion on the right labia. There is no rash, tenderness or lesion on the left labia. No erythema or bleeding in the vagina. Right adnexum does not display mass and does not display tenderness. Left adnexum does not display mass and does not display tenderness. Cervix does not exhibit motion tenderness, discharge, polyp or nabothian cyst.   Uterus is mobile and midaxial. Uterus is not enlarged or exhibiting a mass.  Genitourinary Comments: Labia erythematous c/w psoriasis; no lesion Mild right adnexal T,  no mass  Abdominal: Soft. She exhibits no distension. There is no tenderness.  Musculoskeletal: Normal range of motion.  Neurological: She is alert and oriented to person, place, and time. No cranial nerve deficit.  Skin: Skin is warm and dry.  Psychiatric: She has a normal mood and affect.    Endometrial Biopsy After discussion with the patient regarding her abnormal uterine bleeding I recommended that she proceed with an endometrial biopsy for further diagnosis. The risks, benefits, alternatives, and indications for an endometrial biopsy were discussed with the patient in detail. She understood the risks including infection, bleeding, cervical laceration and uterine perforation.  Verbal consent was obtained.   PROCEDURE NOTE:  Pipelle endometrial biopsy was performed using aseptic technique with iodine preparation.  The uterus was sounded to a length of 7 cm.  Adequate sampling was obtained with minimal blood loss.  The patient tolerated the procedure well.  Disposition will be pending pathology.  ASSESSMENT/PLAN:   Problem List Items Addressed This Visit      Other   Endometrial thickening on ultrasound -  Primary   Pelvic pain in female    EMB today US f/u 2 mos Surgery/referral based on results for cancer or hyperplasia; polyp. If normal wait and repeat 2 mos (US) as she has not had any bleeding. Pain does not truly fit with uterine pain for 7 years.  Cont to investigate other avenues.  Annamarie MajorPaul Harris, MD, Merlinda FrederickFACOG [facility], [facility] 08/31/2017  1:41 PM

## 2017-09-01 ENCOUNTER — Other Ambulatory Visit: Payer: Self-pay | Admitting: Family Medicine

## 2017-09-01 NOTE — Telephone Encounter (Signed)
Last filled  By Dr.Cook 12/08/16 last OV 08/01/17

## 2017-09-03 ENCOUNTER — Telehealth: Payer: Self-pay | Admitting: Family Medicine

## 2017-09-03 DIAGNOSIS — G8929 Other chronic pain: Secondary | ICD-10-CM

## 2017-09-03 DIAGNOSIS — M546 Pain in thoracic spine: Principal | ICD-10-CM

## 2017-09-03 LAB — PAP IG (IMAGE GUIDED): PAP Smear Comment: 0

## 2017-09-03 NOTE — Telephone Encounter (Signed)
Please let the patient know that I got a letter back from gynecology.  They did not feel the findings from the ultrasound would be contributing to her pain and recommended continued evaluation for this.  I know she has seen urology and orthopedics for this previously.  Please find out if she has ever had any imaging of her back to evaluate this.  Please see if she has stomach pain with it as we could potentially have her see GI if that is the case.  Thanks.

## 2017-09-04 LAB — PATHOLOGY

## 2017-09-04 NOTE — Telephone Encounter (Signed)
It may be worth completing an MRI versus having her see orthopedics again to evaluate.  Please see if she would like to do either of these.  Thanks.

## 2017-09-04 NOTE — Telephone Encounter (Signed)
Patient states no imaging of her back just a CT from waist to hip. 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

## 2017-09-05 NOTE — Telephone Encounter (Signed)
Patient states she is very claustrophobic and would rather see orthopedic [facility], please place referral

## 2017-09-05 NOTE — Addendum Note (Signed)
Addended by: Birdie SonsSONNENBERG, ERIC G on: 09/05/2017 12:40 PM   Modules accepted: Orders

## 2017-09-05 NOTE — Telephone Encounter (Signed)
Referral placed.

## 2017-09-06 NOTE — Progress Notes (Signed)
Pt went on 08/31/2017.

## 2017-09-18 DIAGNOSIS — M170 Bilateral primary osteoarthritis of knee: Secondary | ICD-10-CM | POA: Diagnosis not present

## 2017-09-18 DIAGNOSIS — M5416 Radiculopathy, lumbar region: Secondary | ICD-10-CM | POA: Diagnosis not present

## 2017-09-27 ENCOUNTER — Other Ambulatory Visit: Payer: Self-pay | Admitting: Student

## 2017-09-27 DIAGNOSIS — M79604 Pain in right leg: Secondary | ICD-10-CM

## 2017-09-27 DIAGNOSIS — M5416 Radiculopathy, lumbar region: Secondary | ICD-10-CM

## 2017-10-06 ENCOUNTER — Ambulatory Visit
Admission: RE | Admit: 2017-10-06 | Discharge: 2017-10-06 | Disposition: A | Discharge status: Home / Self Care | Payer: PPO | Source: Ambulatory Visit | Attending: Student | Admitting: Student

## 2017-10-06 DIAGNOSIS — M545 Low back pain: Secondary | ICD-10-CM | POA: Diagnosis not present

## 2017-10-06 DIAGNOSIS — M5416 Radiculopathy, lumbar region: Secondary | ICD-10-CM

## 2017-10-06 DIAGNOSIS — M5116 Intervertebral disc disorders with radiculopathy, lumbar region: Secondary | ICD-10-CM | POA: Diagnosis not present

## 2017-10-06 DIAGNOSIS — M79604 Pain in right leg: Secondary | ICD-10-CM

## 2017-10-06 DIAGNOSIS — M4316 Spondylolisthesis, lumbar region: Secondary | ICD-10-CM | POA: Diagnosis not present

## 2017-10-09 ENCOUNTER — Other Ambulatory Visit: Payer: Self-pay | Admitting: Physician Assistant

## 2017-10-09 DIAGNOSIS — M5416 Radiculopathy, lumbar region: Secondary | ICD-10-CM

## 2017-10-12 ENCOUNTER — Ambulatory Visit
Admission: RE | Admit: 2017-10-12 | Discharge: 2017-10-12 | Disposition: A | Discharge status: Home / Self Care | Payer: PPO | Source: Ambulatory Visit | Attending: Physician Assistant | Admitting: Physician Assistant

## 2017-10-12 DIAGNOSIS — M5416 Radiculopathy, lumbar region: Secondary | ICD-10-CM

## 2017-10-12 DIAGNOSIS — M5126 Other intervertebral disc displacement, lumbar region: Secondary | ICD-10-CM | POA: Diagnosis not present

## 2017-10-12 MED ORDER — METHYLPREDNISOLONE ACETATE 40 MG/ML INJ SUSP (RADIOLOG
120.0000 mg | Freq: Once | INTRAMUSCULAR | Status: AC
  Administered 2017-10-12: 120 mg via EPIDURAL

## 2017-10-12 MED ORDER — IOPAMIDOL (ISOVUE-M 200) INJECTION 41%
1.0000 mL | Freq: Once | INTRAMUSCULAR | Status: AC
  Administered 2017-10-12: 1 mL via EPIDURAL

## 2017-10-12 NOTE — Discharge Instructions (Signed)

## 2017-10-31 ENCOUNTER — Other Ambulatory Visit: Payer: Self-pay | Admitting: Obstetrics & Gynecology

## 2017-10-31 ENCOUNTER — Ambulatory Visit (INDEPENDENT_AMBULATORY_CARE_PROVIDER_SITE_OTHER): Payer: PPO | Admitting: Obstetrics & Gynecology

## 2017-10-31 ENCOUNTER — Ambulatory Visit (INDEPENDENT_AMBULATORY_CARE_PROVIDER_SITE_OTHER): Payer: PPO

## 2017-10-31 ENCOUNTER — Encounter: Payer: Self-pay | Admitting: Obstetrics & Gynecology

## 2017-10-31 VITALS — BP 138/90 | Ht 65.5 in | Wt 223.0 lb

## 2017-10-31 DIAGNOSIS — R9389 Abnormal findings on diagnostic imaging of other specified body structures: Secondary | ICD-10-CM

## 2017-10-31 DIAGNOSIS — R102 Pelvic and perineal pain: Secondary | ICD-10-CM

## 2017-10-31 NOTE — Progress Notes (Signed)
HPI: Pt has h/o endometrial thickening on [place] for other reasons.  Has had no bleeding.  Pt does have 7 year h/o low back pain and pelvic pains at times.  Pt has a vaginal pain that radiates to the right side and back, assoc w nausea at times, intermittant and severe at times, modifiers include rest, and nothing makes it worse. Has been seen w MRI and possibility of spinal disk disease. Recent EMB and PAP negative for cancer or dysplasia.  Ultrasound demonstrates no masses seen, ES 4.5 cm, possible small polyp in uterus.  PMHx: She  has a past medical history of Arthritis, Chicken pox, GERD (gastroesophageal reflux disease), History of UTI, Hyperlipidemia, and Hypertension. Also,  has a past surgical history that includes Cholecystectomy (2011); Biopsy thyroid (07/2013); and Fracture surgery (Right)., family history includes Cancer in her father; Heart disease in her father and mother; Hypertension in her father and mother; Mental illness in her mother.,  reports that she quit smoking about 21 years ago. She has never used smokeless tobacco. She reports that she drinks about 1.0 - 2.0 oz of alcohol per week. She reports that she does not use drugs.  She has a current medication list which includes the following prescription(s): amlodipine, atorvastatin, gabapentin, nystatin cream, nystatin-triamcinolone, tramadol, trazodone, and triamcinolone cream. Also, has No Known Allergies.  Review of Systems  All other systems reviewed and are negative.  Objective: BP 138/90   Ht 5' 5.5" (1.664 m)   Wt 223 lb (101.2 kg)   BMI 36.54 kg/m   Physical examination Constitutional NAD, Conversant  Skin No rashes, lesions or ulceration.   Extremities: Moves all appropriately.  Normal ROM for age. No lymphadenopathy.  Neuro: Grossly intact  Psych: Oriented to PPT.  Normal mood. Normal affect.   US Pelvis Transvanginal Non-ob (tv Only)  Result Date: 10/31/2017 ULTRASOUND REPORT Patient Name: Alice Campbell  DOB: Oct 04, 1950 MRN: [id number] Location: [facility] Date of Service: 10/31/2017 Indications:Pelvic Pain and Endometrial thickening seen on outside u/s Findings: The uterus is anteverted and measures 5.82 x 4.74 x 3.26cm. Echo texture is homogenous without evidence of focal masses. The Endometrium measures 4.56 mm. - there is a tiny amount of fluid within the endometrium (1.64mm) - echogenic area within the endometrium suspicious for a polyp (5.1 x 2.25mm) Right Ovary measures 1.87 x 1.33 x 2.09 cm. It is normal in appearance. Left Ovary measures 1.48 x 1.08 x 1.84 cm. It is normal in appearance. Survey of the adnexa demonstrates no adnexal masses. There is no free fluid in the cul de sac. Impression: 1. Possible endometrial polyp. 2. Small amount of fluid within the endometrium Recommendations: 1.Clinical correlation with the patient's History and Physical Exam. Willette Alma, RDMS, RVT Review of ULTRASOUND.    I have personally reviewed images and report of recent ultrasound done at [facility].    Plan of management to be discussed with patient. Annamarie Major, MD, FACOG [facility], [facility] 10/31/2017  3:11 PM   Assessment:  Endometrial thickening on ultrasound Pelvic pain in female  Small polyp, normal EMB.    Recommend no intervention at this time, unless she has future bleeding, or unless she just wants polyp out to assess its affect (low likelihood) on pelvic pain  Otherwise, follow up w [place] next year  A total of 15 minutes were spent face-to-face with the patient during this encounter and over half of that time dealt with counseling and coordination of care.  Annamarie Major, MD,  [facility], [facility] 10/31/2017  3:17 PM

## 2017-11-02 ENCOUNTER — Other Ambulatory Visit: Payer: Self-pay | Admitting: Family Medicine

## 2017-11-03 NOTE — Telephone Encounter (Signed)
Last OV 08/01/17 last filled 08/30/17 60 0rf

## 2017-11-27 DIAGNOSIS — M170 Bilateral primary osteoarthritis of knee: Secondary | ICD-10-CM | POA: Diagnosis not present

## 2017-12-13 ENCOUNTER — Other Ambulatory Visit: Payer: Self-pay | Admitting: Family Medicine

## 2017-12-13 NOTE — Telephone Encounter (Signed)
Last OV 08/01/17 last filled 11/03/17 60 0rf

## 2017-12-14 ENCOUNTER — Other Ambulatory Visit: Payer: Self-pay | Admitting: Student

## 2017-12-14 DIAGNOSIS — M5416 Radiculopathy, lumbar region: Secondary | ICD-10-CM

## 2017-12-14 NOTE — Telephone Encounter (Signed)
Sent to pharmacy.  Drug database reviewed. 

## 2017-12-16 ENCOUNTER — Other Ambulatory Visit: Payer: Self-pay | Admitting: Family Medicine

## 2017-12-18 NOTE — Telephone Encounter (Signed)
Last OV 08/01/17 last filled 08/17/17 30 2rf Requests 90 day supply

## 2017-12-20 NOTE — Telephone Encounter (Signed)
Sent to pharmacy 

## 2017-12-22 ENCOUNTER — Ambulatory Visit
Admission: RE | Admit: 2017-12-22 | Discharge: 2017-12-22 | Disposition: A | Discharge status: Home / Self Care | Payer: PPO | Source: Ambulatory Visit | Attending: Student | Admitting: Student

## 2017-12-22 DIAGNOSIS — M47817 Spondylosis without myelopathy or radiculopathy, lumbosacral region: Secondary | ICD-10-CM | POA: Diagnosis not present

## 2017-12-22 DIAGNOSIS — M5416 Radiculopathy, lumbar region: Secondary | ICD-10-CM

## 2017-12-22 MED ORDER — METHYLPREDNISOLONE ACETATE 40 MG/ML INJ SUSP (RADIOLOG
120.0000 mg | Freq: Once | INTRAMUSCULAR | Status: AC
  Administered 2017-12-22: 120 mg via EPIDURAL

## 2017-12-22 MED ORDER — IOPAMIDOL (ISOVUE-M 200) INJECTION 41%
1.0000 mL | Freq: Once | INTRAMUSCULAR | Status: AC
  Administered 2017-12-22: 1 mL via EPIDURAL

## 2017-12-22 NOTE — Discharge Instructions (Signed)

## 2018-02-12 ENCOUNTER — Other Ambulatory Visit: Payer: Self-pay | Admitting: Family Medicine

## 2018-02-13 NOTE — Telephone Encounter (Signed)
Last OV 08/01/17 last filled 12/14/17 60 0rf

## 2018-02-13 NOTE — Telephone Encounter (Signed)
Sent to pharmacy.  Patient needs follow-up scheduled with me.

## 2018-02-15 ENCOUNTER — Other Ambulatory Visit: Payer: Self-pay | Admitting: Family Medicine

## 2018-02-15 DIAGNOSIS — M5416 Radiculopathy, lumbar region: Secondary | ICD-10-CM

## 2018-02-28 DIAGNOSIS — M170 Bilateral primary osteoarthritis of knee: Secondary | ICD-10-CM | POA: Diagnosis not present

## 2018-03-02 ENCOUNTER — Ambulatory Visit
Admission: RE | Admit: 2018-03-02 | Discharge: 2018-03-02 | Disposition: A | Discharge status: Home / Self Care | Payer: PPO | Source: Ambulatory Visit | Attending: Family Medicine | Admitting: Family Medicine

## 2018-03-02 ENCOUNTER — Other Ambulatory Visit: Payer: Self-pay | Admitting: Family Medicine

## 2018-03-02 DIAGNOSIS — M4727 Other spondylosis with radiculopathy, lumbosacral region: Secondary | ICD-10-CM | POA: Diagnosis not present

## 2018-03-02 DIAGNOSIS — M5416 Radiculopathy, lumbar region: Secondary | ICD-10-CM

## 2018-03-02 MED ORDER — IOPAMIDOL (ISOVUE-M 200) INJECTION 41%
1.0000 mL | Freq: Once | INTRAMUSCULAR | Status: AC
  Administered 2018-03-02: 1 mL via EPIDURAL

## 2018-03-02 MED ORDER — METHYLPREDNISOLONE ACETATE 40 MG/ML INJ SUSP (RADIOLOG
120.0000 mg | Freq: Once | INTRAMUSCULAR | Status: AC
  Administered 2018-03-02: 120 mg via EPIDURAL

## 2018-03-02 NOTE — Discharge Instructions (Signed)

## 2018-03-05 ENCOUNTER — Other Ambulatory Visit: Payer: Self-pay | Admitting: Family Medicine

## 2018-03-06 ENCOUNTER — Ambulatory Visit: Payer: Self-pay

## 2018-03-09 ENCOUNTER — Other Ambulatory Visit: Payer: Self-pay | Admitting: Family Medicine

## 2018-04-08 ENCOUNTER — Other Ambulatory Visit: Payer: Self-pay | Admitting: Family Medicine

## 2018-04-09 NOTE — Telephone Encounter (Signed)
Last OV 08/01/2017   Last refilled 02/13/2018 disp 60 with no refills   Sent to PCP for approval

## 2018-04-15 DIAGNOSIS — Z23 Encounter for immunization: Secondary | ICD-10-CM | POA: Diagnosis not present

## 2018-04-15 DIAGNOSIS — S81802A Unspecified open wound, left lower leg, initial encounter: Secondary | ICD-10-CM | POA: Diagnosis not present

## 2018-04-20 IMAGING — XA Imaging study
1 series · 1 of 1 positions shown · non-contrast
Comparison: none

CLINICAL DATA: Lumbar radiculopathy. Displacement of the L3-4
lumbar disc. Right inguinal radicular symptoms.

[Series 2: ortho adipose · 1 of 1 slices shown]
[im 1/1]
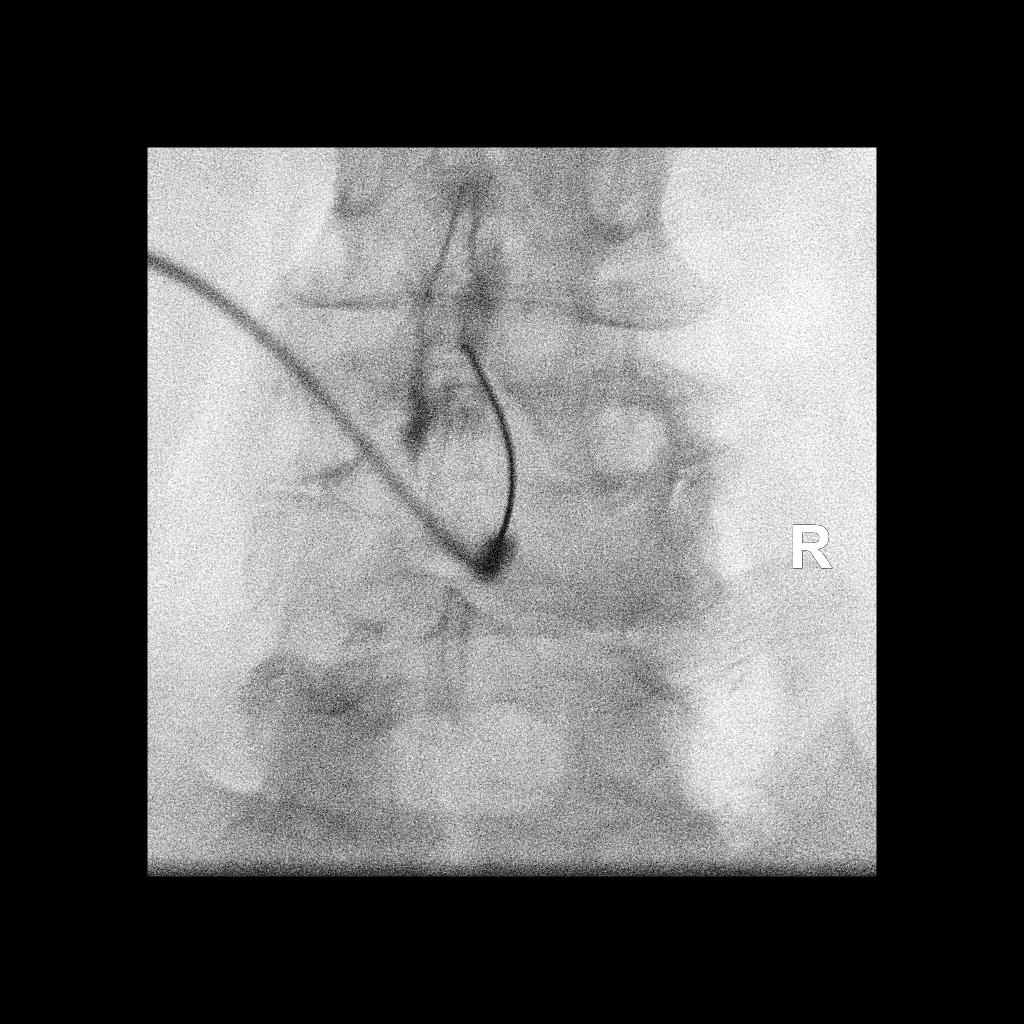

[1 of 1 positions shown; findings below may reference images not displayed]

FLUOROSCOPY TIME:  Radiation Exposure Index (as provided by the
fluoroscopic device): 21.72 uGy*m2

Fluoroscopy Time:  11 seconds

Number of Acquired Images:  0

PROCEDURE:
The procedure, risks, benefits, and alternatives were explained to
the patient. Questions regarding the procedure were encouraged and
answered. The patient understands and consents to the procedure.

LUMBAR EPIDURAL INJECTION:

An interlaminar approach was performed on right at L3-4. The
overlying skin was cleansed and anesthetized. A 20 gauge epidural
needle was advanced using loss-of-resistance technique.

DIAGNOSTIC EPIDURAL INJECTION:

Injection of Isovue-M 200 shows a good epidural pattern with spread
above and below the level of needle placement, primarily on the
right no vascular opacification is seen.

THERAPEUTIC EPIDURAL INJECTION:

120 mg of Depo-Medrol mixed with 3 mL 1% lidocaine were instilled.
The procedure was well-tolerated, and the patient was discharged
thirty minutes following the injection in good condition.

COMPLICATIONS:
None
IMPRESSION: Technically successful epidural injection on the right L3-4 # 1

## 2018-04-25 DIAGNOSIS — Z6841 Body Mass Index (BMI) 40.0 and over, adult: Secondary | ICD-10-CM | POA: Diagnosis not present

## 2018-04-25 DIAGNOSIS — M7051 Other bursitis of knee, right knee: Secondary | ICD-10-CM | POA: Diagnosis not present

## 2018-04-25 DIAGNOSIS — M1712 Unilateral primary osteoarthritis, left knee: Secondary | ICD-10-CM | POA: Diagnosis not present

## 2018-04-27 DIAGNOSIS — Z6841 Body Mass Index (BMI) 40.0 and over, adult: Secondary | ICD-10-CM | POA: Insufficient documentation

## 2018-04-27 DIAGNOSIS — E669 Obesity, unspecified: Secondary | ICD-10-CM | POA: Insufficient documentation

## 2018-05-02 ENCOUNTER — Other Ambulatory Visit: Payer: Self-pay | Admitting: Family Medicine

## 2018-05-02 DIAGNOSIS — M5416 Radiculopathy, lumbar region: Secondary | ICD-10-CM

## 2018-05-10 ENCOUNTER — Other Ambulatory Visit: Payer: Self-pay | Admitting: Family Medicine

## 2018-05-10 NOTE — Telephone Encounter (Signed)
Last OV 08/01/2017   Last refilled 02/13/2018 disp 90 with no refills   Sent to PCP for approval

## 2018-05-10 NOTE — Telephone Encounter (Signed)
Controlled substance database reviewed. Sent to pharmacy.  Please call the patient.  She needs follow-up for further refills of this medication.

## 2018-05-16 ENCOUNTER — Ambulatory Visit
Admission: RE | Admit: 2018-05-16 | Discharge: 2018-05-16 | Disposition: A | Discharge status: Home / Self Care | Payer: PPO | Source: Ambulatory Visit | Attending: Family Medicine | Admitting: Family Medicine

## 2018-05-16 DIAGNOSIS — M5416 Radiculopathy, lumbar region: Secondary | ICD-10-CM

## 2018-05-16 MED ORDER — METHYLPREDNISOLONE ACETATE 40 MG/ML INJ SUSP (RADIOLOG
120.0000 mg | Freq: Once | INTRAMUSCULAR | Status: AC
  Administered 2018-05-16: 120 mg via EPIDURAL

## 2018-05-16 MED ORDER — IOPAMIDOL (ISOVUE-M 200) INJECTION 41%
1.0000 mL | Freq: Once | INTRAMUSCULAR | Status: AC
  Administered 2018-05-16: 1 mL via EPIDURAL

## 2018-06-01 ENCOUNTER — Other Ambulatory Visit: Payer: Self-pay | Admitting: Family Medicine

## 2018-06-01 NOTE — Telephone Encounter (Signed)
NO OV since 08/01/17

## 2018-06-01 NOTE — Telephone Encounter (Signed)
30-day refill sent to pharmacy.  Please contact the patient to set up follow-up.

## 2018-06-06 NOTE — Telephone Encounter (Signed)
Patient scheduled.

## 2018-06-11 ENCOUNTER — Other Ambulatory Visit: Payer: Self-pay

## 2018-06-11 MED ORDER — AMLODIPINE BESYLATE 5 MG PO TABS: 5.0000 mg | ORAL_TABLET | Freq: Every day | ORAL | 0 refills | Status: DC

## 2018-06-26 ENCOUNTER — Other Ambulatory Visit: Payer: Self-pay | Admitting: Family Medicine

## 2018-07-02 ENCOUNTER — Ambulatory Visit (INDEPENDENT_AMBULATORY_CARE_PROVIDER_SITE_OTHER): Payer: PPO | Admitting: Family Medicine

## 2018-07-02 ENCOUNTER — Encounter: Payer: Self-pay | Admitting: Family Medicine

## 2018-07-02 VITALS — BP 150/96 | HR 93 | Temp 98.3°F | Ht 65.5 in | Wt 228.8 lb

## 2018-07-02 DIAGNOSIS — R600 Localized edema: Secondary | ICD-10-CM | POA: Diagnosis not present

## 2018-07-02 DIAGNOSIS — Z1159 Encounter for screening for other viral diseases: Secondary | ICD-10-CM

## 2018-07-02 DIAGNOSIS — I10 Essential (primary) hypertension: Secondary | ICD-10-CM | POA: Diagnosis not present

## 2018-07-02 DIAGNOSIS — T148XXA Other injury of unspecified body region, initial encounter: Secondary | ICD-10-CM | POA: Diagnosis not present

## 2018-07-02 DIAGNOSIS — E785 Hyperlipidemia, unspecified: Secondary | ICD-10-CM | POA: Diagnosis not present

## 2018-07-02 MED ORDER — PNEUMOCOCCAL VAC POLYVALENT 25 MCG/0.5ML IJ INJ: 0.5000 mL | INJECTION | Freq: Once | INTRAMUSCULAR | 0 refills | Status: AC

## 2018-07-02 NOTE — Patient Instructions (Signed)
Nice to see you. Please start checking your blood pressure at home.  We will have you return in 2 weeks for repeat BP check. We will check lab work today and contact you with the results.

## 2018-07-02 NOTE — Assessment & Plan Note (Signed)
Check lipid panel.  Continue Lipitor. 

## 2018-07-02 NOTE — Assessment & Plan Note (Signed)
Likely related to amlodipine or venous insufficiency.  Will check labs for other causes.

## 2018-07-02 NOTE — Assessment & Plan Note (Signed)
I suspect this is from her bumping her arms and legs though we will check a CBC to evaluate her platelets.

## 2018-07-02 NOTE — Progress Notes (Signed)
Tommi Rumps, MD Phone: [phone]  Alice Campbell is a 68 y.o. female who presents today for follow-up.  CC: Hypertension, hyperlipidemia, nervous stomach, bruising  Hypertension: Not checking at home.  She is taking amlodipine.  No chest pain or shortness of breath.  Occasional foot and ankle edema.  This has been going on over the last year.  It was worse during the summer.  No orthopnea or PND.  Hyperlipidemia: Taking Lipitor.  No right upper quadrant pain.  Does note some muscle cramps though no myalgias.  Nervous stomach: Patient reports she has a nervous stomach and will intermittently have discomfort in her epigastric area.  This has been going on for many years.  She reports prior evaluation that was unremarkable for a cause.  She notes this only occurs when she has nervousness or anxiety.  Bruising: Patient notes for the last 3 to 4 months she has had bruises on her arms and legs with bumping into things just slightly.  She notes no bruising elsewhere.  No bleeding from elsewhere.  Social History   Tobacco Use  Smoking Status Former Smoker  . Last attempt to quit: 01/19/1996  . Years since quitting: 22.4  Smokeless Tobacco Never Used  Tobacco Comment   quit in 2000     ROS see history of present illness  Objective  Physical Exam Vitals:   07/02/18 1516 07/02/18 1538  BP: (!) 138/92 (!) 150/96  Pulse: 93   Temp: 98.3 F (36.8 C)   SpO2: 98%     BP Readings from Last 3 Encounters:  07/02/18 (!) 150/96  05/16/18 (!) 161/82  03/02/18 (!) 179/97   Wt Readings from Last 3 Encounters:  07/02/18 228 lb 12.8 oz (103.8 kg)  10/31/17 223 lb (101.2 kg)  08/31/17 220 lb (99.8 kg)    Physical Exam Constitutional:      General: She is not in acute distress.    Appearance: She is not diaphoretic.  Cardiovascular:     Rate and Rhythm: Normal rate and regular rhythm.     Heart sounds: Normal heart sounds.  Pulmonary:     Effort: Pulmonary effort is normal.     Breath sounds: Normal breath sounds.  Abdominal:     General: Bowel sounds are normal. There is no distension.     Palpations: Abdomen is soft.     Tenderness: There is no abdominal tenderness. There is no guarding or rebound.  Skin:    General: Skin is warm and dry.     Comments: Scattered bruises on her forearms and anterior lower legs  Neurological:     Mental Status: She is alert.      Assessment/Plan: Please see individual problem list.  HTN (hypertension) Above goal today though has been well controlled on review of care everywhere with several recent doctor's visits.  We will have her start checking at home.  She will return in 2 weeks for recheck in the office.  Continue amlodipine.  Hyperlipidemia Check lipid panel.  Continue Lipitor.  Bilateral leg edema Likely related to amlodipine or venous insufficiency.  Will check labs for other causes.  Bruising I suspect this is from her bumping her arms and legs though we will check a CBC to evaluate her platelets.  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  There have been no changes.  She had no acute findings on CT abdomen and pelvis from 2015.  She will monitor given long-term stability and prior benign CT.  Health Maintenance: Hep C screening completed.  She will get the pneumonia vaccine from her pharmacy.  Orders Placed This Encounter  Procedures  . CBC  . Comp Met (CMET)  . TSH  . Hepatitis C Antibody  . Lipid panel    Meds ordered this encounter  Medications  . pneumococcal 23 valent vaccine (PNU-IMMUNE) 25 MCG/0.5ML injection    Sig: Inject 0.5 mLs into the muscle once for 1 dose.    Dispense:  0.5 mL    Refill:  0     Tommi Rumps, MD [place]

## 2018-07-02 NOTE — Assessment & Plan Note (Signed)
Above goal today though has been well controlled on review of care everywhere with several recent doctor's visits.  We will have her start checking at home.  She will return in 2 weeks for recheck in the office.  Continue amlodipine.

## 2018-07-03 LAB — COMPREHENSIVE METABOLIC PANEL
ALT: 12 U/L (ref 0–35)
AST: 16 U/L (ref 0–37)
Albumin: 3.9 g/dL (ref 3.5–5.2)
Alkaline Phosphatase: 94 U/L (ref 39–117)
BUN: 12 mg/dL (ref 6–23)
CO2: 27 mEq/L (ref 19–32)
CREATININE: 0.99 mg/dL (ref 0.40–1.20)
Calcium: 9.4 mg/dL (ref 8.4–10.5)
Chloride: 105 mEq/L (ref 96–112)
GFR: 59.21 mL/min — ABNORMAL LOW (ref >60.00)
Glucose, Bld: 95 mg/dL (ref 70–99)
Potassium: 4.1 mEq/L (ref 3.5–5.1)
Sodium: 139 mEq/L (ref 135–145)
Total Bilirubin: 0.6 mg/dL (ref 0.2–1.2)
Total Protein: 6.4 g/dL (ref 6.0–8.3)

## 2018-07-03 LAB — LIPID PANEL
Cholesterol: 152 mg/dL (ref 0–200)
HDL: 56.6 mg/dL (ref >39.00)
LDL Cholesterol: 60 mg/dL (ref 0–99)
NonHDL: 95.15
Total CHOL/HDL Ratio: 3
Triglycerides: 175 mg/dL — ABNORMAL HIGH (ref 0.0–149.0)
VLDL: 35 mg/dL (ref 0.0–40.0)

## 2018-07-03 LAB — CBC
HEMATOCRIT: 44.6 % (ref 36.0–46.0)
Hemoglobin: 14.9 g/dL (ref 12.0–15.0)
MCHC: 33.5 g/dL (ref 30.0–36.0)
MCV: 98.8 fl (ref 78.0–100.0)
Platelets: 241 10*3/uL (ref 150.0–400.0)
RBC: 4.51 Mil/uL (ref 3.87–5.11)
RDW: 13.8 % (ref 11.5–15.5)
WBC: 8.7 10*3/uL (ref 4.0–10.5)

## 2018-07-03 LAB — TSH: TSH: 1.09 u[IU]/mL (ref 0.35–4.50)

## 2018-07-03 LAB — HEPATITIS C ANTIBODY
Hepatitis C Ab: NONREACTIVE
SIGNAL TO CUT-OFF: 0.02 (ref <1.00)

## 2018-07-05 DIAGNOSIS — M1712 Unilateral primary osteoarthritis, left knee: Secondary | ICD-10-CM | POA: Diagnosis not present

## 2018-07-06 DIAGNOSIS — H10023 Other mucopurulent conjunctivitis, bilateral: Secondary | ICD-10-CM | POA: Diagnosis not present

## 2018-07-06 DIAGNOSIS — I10 Essential (primary) hypertension: Secondary | ICD-10-CM | POA: Diagnosis not present

## 2018-07-09 ENCOUNTER — Other Ambulatory Visit: Payer: Self-pay | Admitting: Family Medicine

## 2018-07-11 NOTE — Telephone Encounter (Signed)
Controlled substance database reviewed. Sent to pharmacy.   

## 2018-07-18 ENCOUNTER — Ambulatory Visit (INDEPENDENT_AMBULATORY_CARE_PROVIDER_SITE_OTHER): Payer: PPO

## 2018-07-18 ENCOUNTER — Ambulatory Visit: Payer: Self-pay

## 2018-07-18 ENCOUNTER — Other Ambulatory Visit: Payer: Self-pay | Admitting: Family Medicine

## 2018-07-18 VITALS — BP 130/70 | HR 76 | Temp 98.4°F | Resp 16 | Ht 64.0 in | Wt 229.0 lb

## 2018-07-18 DIAGNOSIS — Z0000 Encounter for general adult medical examination without abnormal findings: Secondary | ICD-10-CM | POA: Diagnosis not present

## 2018-07-18 DIAGNOSIS — M5416 Radiculopathy, lumbar region: Secondary | ICD-10-CM

## 2018-07-18 NOTE — Progress Notes (Signed)
Subjective:   Alice Campbell is a 68 y.o. female who presents for an Initial Medicare Annual Wellness Visit.  Review of Systems    No ROS.  Medicare Wellness Visit. Additional risk factors are reflected in the social history.  Cardiac Risk Factors include: advanced age (>29men, >75 women);hypertension     Objective:    Today's Vitals   07/18/18 1323  BP: 130/70  Pulse: 76  Resp: 16  Temp: 98.4 F (36.9 C)  TempSrc: Oral  SpO2: 98%  Weight: 229 lb (103.9 kg)  Height: 5\' 4"  (1.626 m)   Body mass index is 39.31 kg/m.  Advanced Directives 07/18/2018 05/24/2016 01/19/2016 01/12/2016  Does Patient Have a Medical Advance Directive? No No No No  Would patient like information on creating a medical advance directive? No - Patient declined (No Data) Yes - Educational materials given (No Data)    Current Medications (verified) Outpatient Encounter Medications as of 07/18/2018  Medication Sig  . amLODipine (NORVASC) 5 MG tablet TAKE 1 TABLET BY MOUTH EVERY DAY  . atorvastatin (LIPITOR) 20 MG tablet TAKE 1 TABLET BY MOUTH EVERY DAY  . gabapentin (NEURONTIN) 300 MG capsule TAKE 1 CAPSULE BY MOUTH TWICE A DAY  . nystatin cream (MYCOSTATIN) APPLY 1 APPLICATION TOPICALLY TWICE A DAY (*MIX WITH TRIAMCINOLONE CREAM... FOR INS. COVERAGE)  . nystatin-triamcinolone (MYCOLOG II) cream Apply 1 application topically 2 (two) times daily.  . traMADol (ULTRAM) 50 MG tablet TAKE 2 TABLETS BY MOUTH EVERY 8 HOURS AS NEEDED FOR MODERATE PAIN.  . traZODone (DESYREL) 50 MG tablet TAKE 1 TABLET (50 MG TOTAL) BY MOUTH AT BEDTIME AS NEEDED FOR SLEEP.  Marland Kitchen triamcinolone cream (KENALOG) 0.1 % APPLY 1 APPLICATION TOPICALLY TWICE A DAY (MIX WITH NYSTATIN CREAM)   No facility-administered encounter medications on file as of 07/18/2018.     Allergies (verified) Patient has no known allergies.   History: Past Medical History:  Diagnosis Date  . Arthritis   . Chicken pox   . GERD (gastroesophageal reflux  disease)   . History of UTI    [facility]  . Hyperlipidemia   . Hypertension    Past Surgical History:  Procedure Laterality Date  . BIOPSY THYROID  07/2013  . CHOLECYSTECTOMY  2011  . FRACTURE SURGERY Right    Hip   Family History  Problem Relation Age of Onset  . Heart disease Mother   . Hypertension Mother   . Mental illness Mother        Nervous breakdown   . Cancer Father        Prostate Cancer  . Heart disease Father   . Hypertension Father   . Diabetes Son    Social History   Socioeconomic History  . Marital status: Married    Spouse name: Not on file  . Number of children: Not on file  . Years of education: 1  . Highest education level: Not on file  Occupational History  . Occupation: Paramedic Work    Comment: Semi-retired  Social Needs  . Financial resource strain: Not hard at all  . Food insecurity:    Worry: Never true    Inability: Never true  . Transportation needs:    Medical: No    Non-medical: No  Tobacco Use  . Smoking status: Former Smoker    Last attempt to quit: 01/19/1996    Years since quitting: 22.5  . Smokeless tobacco: Never Used  . Tobacco comment: quit in 2000  Substance and Sexual  Activity  . Alcohol use: Yes    Alcohol/week: 2.0 - 4.0 standard drinks    Types: 2 - 4 Standard drinks or equivalent per week    Comment: few drinks a week   . Drug use: No  . Sexual activity: Yes  Lifestyle  . Physical activity:    Days per week: 7 days    Minutes per session: 90 min  . Stress: Not at all  Relationships  . Social connections:    Talks on phone: Not on file    Gets together: Not on file    Attends religious service: Not on file    Active member of club or organization: Not on file    Attends meetings of clubs or organizations: Not on file    Relationship status: Not on file  Other Topics Concern  . Not on file  Social History Narrative   Alice Campbell grew up in [place], [place]. She is currently living in [place] with  her husband. She enjoys swimming and reading.    Tobacco Counseling Counseling given: Not Answered Comment: quit in 2000   Clinical Intake:  Pre-visit preparation completed: Yes        Diabetes: No  How often do you need to have someone help you when you read instructions, pamphlets, or other written materials from your doctor or pharmacy?: 1 - Never  Interpreter Needed?: No      Activities of Daily Living In your present state of health, do you have any difficulty performing the following activities: 07/18/2018  Hearing? N  Vision? N  Difficulty concentrating or making decisions? N  Walking or climbing stairs? Y  Dressing or bathing? N  Doing errands, shopping? N  Preparing Food and eating ? Y  Comment Husband assists with meal prep.  Self feeds.   Using the Toilet? N  In the past six months, have you accidently leaked urine? N  Do you have problems with loss of bowel control? N  Managing your Medications? N  Managing your Finances? N  Housekeeping or managing your Housekeeping? Y  Some recent data might be hidden     Immunizations and Health Maintenance Immunization History  Administered Date(s) Administered  . Influenza, High Dose Seasonal PF 04/17/2017, 04/15/2018  . Influenza,inj,Quad PF,6+ Mos 04/24/2017  . Pneumococcal Polysaccharide-23 07/03/2018  . Td 12/09/2013  . Zoster 12/24/2013   Health Maintenance Due  Topic Date Due  . DEXA SCAN  01/20/2015  . PNA vac Low Risk Adult (1 of 2 - PCV13) 01/20/2015  . MAMMOGRAM  03/12/2016    Patient Care Team: Glori LuisSonnenberg, Eric G, MD as PCP - General (Family Medicine)  Indicate any recent Medical Services you may have received from other than [facility] providers in the past year (date may be approximate).     Assessment:   This is a routine wellness examination for Alice Campbell.  Reports blood pressure has been taken by orthopaedic on 07/06/17 at 140/90, by [facility] on 07/06/17 at 130/80.  Today's reading  130/70.  States she has started taking amlodipine 5mg  in the morning instead of at night.    Health Screenings  Mammogram -plans to schedule Colonoscopy -03/21/14 Glaucoma -none  Hearing -demonstrates normal hearing in conversation Hemoglobin A1C- 04/24/17 (5.2) Cholesterol -07/02/18 (152) TSH -07/02/18 (1.09)  Social  Alcohol intake -yes Smoking history- former Smokers in home? none Illicit drug use? none Exercise - stationary mini bike 7 days, 90 minutes Diet -regular Sexually Active- yes Multiple Partners -no  Safety  Patient feels safe at home.  Patient does have smoke detectors at home  Patient does wear sunscreen or protective clothing when in direct sunlight  Patient does wear seat belt when driving or riding with others.   Activities of Daily Living Patient can do their own household chores. Denies needing assistance with: driving, feeding themselves, getting from bed to chair, getting to the toilet, bathing/showering, dressing, managing money, or preparing meals.   Depression Screen Patient denies losing interest in daily life, feeling hopeless, or crying easily over simple problems.   Fall Screen Patient denies being afraid of falling or falling in the last year.   Memory Screen Patient denies problems with memory, misplacing items, and is able to balance checkbook/bank accounts.  Patient is alert, normal appearance, oriented to person/place/and time. Correctly identified the president of the [place], recall of 2/3 objects, and performing simple calculations.  Patient displays appropriate judgement and can read correct time from watch face.   Immunizations The following Immunizations are up to date: Influenza, shingles, pneumonia, and tetanus.   Other Providers Patient Care Team: Glori Luis, MD as PCP - General (Family Medicine)  Hearing/Vision screen  Visual Acuity Screening   Right eye Left eye Both eyes  Without correction:   20/30  With correction:      Hearing Screening Comments: Patient is able to hear conversational tones without difficulty.  No issues reported.   Dietary issues and exercise activities discussed: Current Exercise Habits: Home exercise routine, Type of exercise: treadmill, Time (Minutes): > 60(90 minutes), Frequency (Times/Week): 7, Weekly Exercise (Minutes/Week): 0, Intensity: Mild  Goals    . Healthy Lifestyle     Stay active Low carb diet Stay hydrated      Depression Screen PHQ 2/9 Scores 07/18/2018 04/17/2017 01/12/2016  PHQ - 2 Score 0 0 0    Fall Risk Fall Risk  07/18/2018 04/17/2017 01/04/2016  Falls in the past year? 0 No Yes  Number falls in past yr: 1 - 1  Comment Depth perception - -  Injury with Fall? - - Yes  Risk Factor Category  - - High Fall Risk  Risk for fall due to : - - History of fall(s)  Follow up - - Falls prevention discussed   Cognitive Function: MMSE - Mini Mental State Exam 07/18/2018  Orientation to time 5  Orientation to Place 5  Registration 3  Attention/ Calculation 5  Recall 3  Language- name 2 objects 2  Language- repeat 1  Language- follow 3 step command 3  Language- read & follow direction 1  Write a sentence 1  Copy design 1  Total score 30       Screening Tests Health Maintenance  Topic Date Due  . DEXA SCAN  01/20/2015  . PNA vac Low Risk Adult (1 of 2 - PCV13) 01/20/2015  . MAMMOGRAM  03/12/2016  . TETANUS/TDAP  12/10/2023  . COLONOSCOPY  03/21/2024  . INFLUENZA VACCINE  Completed  . Hepatitis C Screening  Completed     Plan:    End of life planning; Advanced aging; Advanced directives discussed.  No HCPOA/Living Will.  Additional information declined at this time.  I have personally reviewed and noted the following in the patient's chart:   . Medical and social history . Use of alcohol, tobacco or illicit drugs  . Current medications and supplements . Functional ability and status . Nutritional status . Physical activity . Advanced  directives . List of other physicians . Hospitalizations, surgeries, and  ER visits in previous 12 months . Vitals . Screenings to include cognitive, depression, and falls . Referrals and appointments  In addition, I have reviewed and discussed with patient certain preventive protocols, quality metrics, and best practice recommendations. A written personalized care plan for preventive services as well as general preventive health recommendations were provided to patient.     Ashok Pall, LPN   07/19/4823

## 2018-07-18 NOTE — Patient Instructions (Addendum)
  Ms. Courtade , Thank you for taking time to come for your Medicare Wellness Visit. I appreciate your ongoing commitment to your health goals. Please review the following plan we discussed and let me know if I can assist you in the future.   These are the goals we discussed: Goals    . Healthy Lifestyle     Stay active Low carb diet Stay hydrated       This is a list of the screening recommended for you and due dates:  Health Maintenance  Topic Date Due  . DEXA scan (bone density measurement)  01/20/2015  . Pneumonia vaccines (1 of 2 - PCV13) 01/20/2015  . Mammogram  03/12/2016  . Tetanus Vaccine  12/10/2023  . Colon Cancer Screening  03/21/2024  . Flu Shot  Completed  .  Hepatitis C: One time screening is recommended by Center for Disease Control  (CDC) for  adults born from [birth year] through 1966.   Completed

## 2018-07-19 NOTE — Progress Notes (Signed)
I have reviewed the above note and agree.  Eric Sonnenberg, M.D.  

## 2018-07-21 ENCOUNTER — Other Ambulatory Visit: Payer: Self-pay | Admitting: Family Medicine

## 2018-07-25 ENCOUNTER — Ambulatory Visit
Admission: RE | Admit: 2018-07-25 | Discharge: 2018-07-25 | Disposition: A | Discharge status: Home / Self Care | Payer: PPO | Source: Ambulatory Visit | Attending: Family Medicine | Admitting: Family Medicine

## 2018-07-25 DIAGNOSIS — M5416 Radiculopathy, lumbar region: Secondary | ICD-10-CM | POA: Diagnosis not present

## 2018-07-25 MED ORDER — METHYLPREDNISOLONE ACETATE 40 MG/ML INJ SUSP (RADIOLOG
120.0000 mg | Freq: Once | INTRAMUSCULAR | Status: AC
  Administered 2018-07-25: 120 mg via EPIDURAL

## 2018-07-25 MED ORDER — IOPAMIDOL (ISOVUE-M 200) INJECTION 41%
1.0000 mL | Freq: Once | INTRAMUSCULAR | Status: AC
  Administered 2018-07-25: 1 mL via EPIDURAL

## 2018-07-25 NOTE — Discharge Instructions (Signed)

## 2018-08-21 ENCOUNTER — Other Ambulatory Visit: Payer: Self-pay | Admitting: Family Medicine

## 2018-09-14 ENCOUNTER — Telehealth: Payer: Self-pay

## 2018-09-14 ENCOUNTER — Other Ambulatory Visit: Payer: Self-pay | Admitting: Family Medicine

## 2018-09-14 NOTE — Telephone Encounter (Signed)
Copied from CRM [id number]. Topic: Appointment Scheduling - Scheduling Inquiry for Clinic >> Sep 14, 2018  9:07 AM Crist Infante wrote: Reason for CRM: pt was to return in 2 months for recheck on kidney function.  Pt wants to know if this appt is necessary at this time?  Pt prefers not to come in right now. Pt states she is doing good and not having any problems. Please advise.  Pt appt is 3/24 at 3 pm

## 2018-09-17 NOTE — Telephone Encounter (Signed)
Sent to PCP to advise 

## 2018-09-17 NOTE — Telephone Encounter (Signed)
She does not necessarily need to come in. We could arrange for just lab work in a few weeks to check her kidney function or we could consider a telephone or webex visit.

## 2018-09-18 ENCOUNTER — Ambulatory Visit: Payer: Self-pay | Admitting: Family Medicine

## 2018-09-18 NOTE — Telephone Encounter (Signed)
Called and spoke with pt. Pt has already canceled her appointment. Pt stated that she will call back to reschedule once thing start to settle down more with the COVID-19

## 2018-10-01 ENCOUNTER — Inpatient Hospital Stay: Admit: 2018-10-01 | Payer: PPO | Admitting: Orthopedic Surgery

## 2018-10-01 ENCOUNTER — Ambulatory Visit: Payer: Self-pay | Admitting: Family Medicine

## 2018-10-01 SURGERY — ARTHROPLASTY, KNEE, TOTAL, USING IMAGELESS COMPUTER-ASSISTED NAVIGATION
Anesthesia: Choice | Laterality: Left

## 2018-10-02 ENCOUNTER — Other Ambulatory Visit: Payer: Self-pay | Admitting: Family Medicine

## 2018-10-02 NOTE — Telephone Encounter (Signed)
Last OV 07/02/2018  Last refilled  08/21/2018 disp 60 with no refills   Next OV none scheduled

## 2018-10-14 ENCOUNTER — Other Ambulatory Visit: Payer: Self-pay | Admitting: Family Medicine

## 2018-11-05 ENCOUNTER — Other Ambulatory Visit: Payer: Self-pay | Admitting: Family Medicine

## 2018-11-05 NOTE — Telephone Encounter (Signed)
Last OV 07/02/2018  Last refilled  traMADol (ULTRAM) 50 MG tablet 60 tablet 0 10/02/2018     Next OV none scheduled   Sent to PCP for approval

## 2018-11-06 NOTE — Telephone Encounter (Signed)
Refill sent to pharmacy. Please contact the patient to get her scheduled for follow-up. Thanks.

## 2018-11-13 ENCOUNTER — Other Ambulatory Visit: Payer: Self-pay | Admitting: Family Medicine

## 2018-11-14 ENCOUNTER — Other Ambulatory Visit: Payer: Self-pay | Admitting: Family Medicine

## 2018-11-14 DIAGNOSIS — M5416 Radiculopathy, lumbar region: Secondary | ICD-10-CM

## 2018-11-24 ENCOUNTER — Other Ambulatory Visit: Payer: Self-pay | Admitting: Family Medicine

## 2018-11-29 ENCOUNTER — Ambulatory Visit
Admission: RE | Admit: 2018-11-29 | Discharge: 2018-11-29 | Disposition: A | Discharge status: Home / Self Care | Payer: PPO | Source: Ambulatory Visit | Attending: Family Medicine | Admitting: Family Medicine

## 2018-11-29 ENCOUNTER — Other Ambulatory Visit: Payer: Self-pay

## 2018-11-29 DIAGNOSIS — M4726 Other spondylosis with radiculopathy, lumbar region: Secondary | ICD-10-CM | POA: Diagnosis not present

## 2018-11-29 DIAGNOSIS — M5416 Radiculopathy, lumbar region: Secondary | ICD-10-CM

## 2018-11-29 MED ORDER — METHYLPREDNISOLONE ACETATE 40 MG/ML INJ SUSP (RADIOLOG
120.0000 mg | Freq: Once | INTRAMUSCULAR | Status: AC
  Administered 2018-11-29: 120 mg via EPIDURAL

## 2018-11-29 MED ORDER — IOPAMIDOL (ISOVUE-M 200) INJECTION 41%
1.0000 mL | Freq: Once | INTRAMUSCULAR | Status: AC
  Administered 2018-11-29: 1 mL via EPIDURAL

## 2018-11-29 NOTE — Discharge Instructions (Signed)

## 2018-12-13 ENCOUNTER — Other Ambulatory Visit: Payer: Self-pay | Admitting: Family Medicine

## 2018-12-18 ENCOUNTER — Other Ambulatory Visit: Payer: Self-pay | Admitting: Family Medicine

## 2018-12-18 NOTE — Telephone Encounter (Signed)
Patient needs to schedule an office visit to receive a refill on this.  Please contact her to get her scheduled and then I will consider refilling for short period of time.  She could be scheduled for a 430 doxy visit if needed.

## 2018-12-25 ENCOUNTER — Other Ambulatory Visit: Payer: Self-pay

## 2018-12-25 ENCOUNTER — Ambulatory Visit (INDEPENDENT_AMBULATORY_CARE_PROVIDER_SITE_OTHER): Payer: PPO | Admitting: Family Medicine

## 2018-12-25 DIAGNOSIS — I10 Essential (primary) hypertension: Secondary | ICD-10-CM

## 2018-12-25 DIAGNOSIS — R109 Unspecified abdominal pain: Secondary | ICD-10-CM

## 2018-12-25 DIAGNOSIS — E785 Hyperlipidemia, unspecified: Secondary | ICD-10-CM | POA: Diagnosis not present

## 2018-12-25 DIAGNOSIS — R9389 Abnormal findings on diagnostic imaging of other specified body structures: Secondary | ICD-10-CM

## 2018-12-25 DIAGNOSIS — R7989 Other specified abnormal findings of blood chemistry: Secondary | ICD-10-CM | POA: Insufficient documentation

## 2018-12-25 DIAGNOSIS — M1651 Unilateral post-traumatic osteoarthritis, right hip: Secondary | ICD-10-CM

## 2018-12-25 NOTE — Progress Notes (Signed)
Virtual Visit via video Note  This visit type was conducted due to national recommendations for restrictions regarding the COVID-19 pandemic (e.g. social distancing).  This format is felt to be most appropriate for this patient at this time.  All issues noted in this document were discussed and addressed.  No physical exam was performed (except for noted visual exam findings with Video Visits).   I connected with Alice Campbell today at  9:30 AM EDT by a video enabled telemedicine application and verified that I am speaking with the correct person using two identifiers. Location patient: home Location provider: home office Persons participating in the virtual visit: patient, provider  I discussed the limitations, risks, security and privacy concerns of performing an evaluation and management service by telephone and the availability of in person appointments. I also discussed with the patient that there may be a patient responsible charge related to this service. The patient expressed understanding and agreed to proceed.  Reason for visit: follow-up  HPI: Hypertension: Typically in the 130s over 70s though occasionally up to 150/84.  She does not check consistently at home.  She is taking amlodipine.  No chest pain or shortness of breath.  She does note some minimal edema that she has this time of year typically.  She will prop her feet up and this will go away.  No orthopnea or PND.  Hyperlipidemia: Taking Lipitor.  No right upper quadrant pain or myalgias.  Right flank pain: This is a chronic issue.  Starts in her right back and radiates around to her right groin.  She had an extensive evaluation for this and it is likely related to a nerve impingement.  She has been having shots in her back for this.  She takes tramadol as needed for this.  No numbness or weakness.  No incontinence.  She notes she will be undergoing a left knee replacement in August with Dr. Ernest PineHooten.   ROS: See pertinent  positives and negatives per HPI.  Past Medical History:  Diagnosis Date  . Arthritis   . Chicken pox   . GERD (gastroesophageal reflux disease)   . History of UTI    [facility]  . Hyperlipidemia   . Hypertension     Past Surgical History:  Procedure Laterality Date  . BIOPSY THYROID  07/2013  . CHOLECYSTECTOMY  2011  . FRACTURE SURGERY Right    Hip    Family History  Problem Relation Age of Onset  . Heart disease Mother   . Hypertension Mother   . Mental illness Mother        Nervous breakdown   . Cancer Father        Prostate Cancer  . Heart disease Father   . Hypertension Father   . Diabetes Son     SOCIAL HX: Former smoker   Current Outpatient Medications:  .  amLODipine (NORVASC) 5 MG tablet, TAKE 1 TABLET BY MOUTH EVERY DAY, Disp: 30 tablet, Rfl: 3 .  atorvastatin (LIPITOR) 20 MG tablet, TAKE 1 TABLET BY MOUTH EVERY DAY, Disp: 90 tablet, Rfl: 2 .  gabapentin (NEURONTIN) 300 MG capsule, TAKE 1 CAPSULE BY MOUTH TWICE A DAY, Disp: 180 capsule, Rfl: 0 .  nystatin cream (MYCOSTATIN), APPLY 1 APPLICATION TOPICALLY TWICE A DAY (*MIX WITH TRIAMCINOLONE CREAM... FOR INS. COVERAGE), Disp: 30 g, Rfl: 0 .  nystatin-triamcinolone (MYCOLOG II) cream, Apply 1 application topically 2 (two) times daily., Disp: 60 g, Rfl: 0 .  traMADol (ULTRAM) 50 MG tablet,  TAKE 2 TABLETS (100 MG TOTAL) BY MOUTH EVERY 8 (EIGHT) HOURS AS NEEDED. FOR PAIN, Disp: 60 tablet, Rfl: 0 .  traZODone (DESYREL) 50 MG tablet, TAKE 1 TABLET (50 MG TOTAL) BY MOUTH AT BEDTIME AS NEEDED FOR SLEEP., Disp: 90 tablet, Rfl: 0 .  triamcinolone cream (KENALOG) 0.1 %, APPLY 1 APPLICATION TOPICALLY TWICE A DAY (MIX WITH NYSTATIN CREAM), Disp: 30 g, Rfl: 0  EXAM:  VITALS per patient if applicable: None.  GENERAL: alert, oriented, appears well and in no acute distress  HEENT: atraumatic, conjunttiva clear, no obvious abnormalities on inspection of external nose and ears  NECK: normal movements of the head  and neck  LUNGS: on inspection no signs of respiratory distress, breathing rate appears normal, no obvious gross SOB, gasping or wheezing  CV: no obvious cyanosis  MS: moves all visible extremities without noticeable abnormality  PSYCH/NEURO: pleasant and cooperative, no obvious depression or anxiety, speech and thought processing grossly intact  ASSESSMENT AND PLAN:  Discussed the following assessment and plan:  HTN (hypertension) Patient is not checking very frequently.  We will have her start checking her blood pressure after buying a blood pressure cuff.  She will contact us in 2 to 3 weeks with her readings and we will determine if we need to alter her regimen at that time.  Increase in creatinine Mild increase previously.  We will recheck.  Abnormal chest CT Previous follow-up CT scan indicated benign findings.  Osteoarthritis She will continue to see orthopedics for her left knee.  Hyperlipidemia Continue Lipitor.  Right flank pain Likely related nerve impingement.  She will continue to see orthopedics for this.  We will continue her tramadol.   Social distancing precautions and sick precautions given regarding COVID-19.   I discussed the assessment and treatment plan with the patient. The patient was provided an opportunity to ask questions and all were answered. The patient agreed with the plan and demonstrated an understanding of the instructions.   The patient was advised to call back or seek an in-person evaluation if the symptoms worsen or if the condition fails to improve as anticipated.    Tommi Rumps, MD

## 2018-12-25 NOTE — Assessment & Plan Note (Signed)
Previous follow-up CT scan indicated benign findings.

## 2018-12-25 NOTE — Assessment & Plan Note (Signed)
Likely related nerve impingement.  She will continue to see orthopedics for this.  We will continue her tramadol.

## 2018-12-25 NOTE — Assessment & Plan Note (Signed)
Mild increase previously.  We will recheck.

## 2018-12-25 NOTE — Assessment & Plan Note (Signed)
Patient is not checking very frequently.  We will have her start checking her blood pressure after buying a blood pressure cuff.  She will contact us in 2 to 3 weeks with her readings and we will determine if we need to alter her regimen at that time.

## 2018-12-25 NOTE — Assessment & Plan Note (Signed)
She will continue to see orthopedics for her left knee.

## 2018-12-25 NOTE — Assessment & Plan Note (Signed)
-  Continue Lipitor °

## 2018-12-26 ENCOUNTER — Other Ambulatory Visit: Payer: Self-pay | Admitting: Family Medicine

## 2019-01-16 ENCOUNTER — Other Ambulatory Visit: Payer: Self-pay

## 2019-01-16 ENCOUNTER — Other Ambulatory Visit (INDEPENDENT_AMBULATORY_CARE_PROVIDER_SITE_OTHER): Payer: PPO

## 2019-01-16 DIAGNOSIS — R7989 Other specified abnormal findings of blood chemistry: Secondary | ICD-10-CM

## 2019-01-16 LAB — BASIC METABOLIC PANEL
BUN: 13 mg/dL (ref 6–23)
CO2: 28 mEq/L (ref 19–32)
Calcium: 9.3 mg/dL (ref 8.4–10.5)
Chloride: 104 mEq/L (ref 96–112)
Creatinine, Ser: 0.81 mg/dL (ref 0.40–1.20)
GFR: 70.11 mL/min (ref >60.00)
Glucose, Bld: 84 mg/dL (ref 70–99)
Potassium: 4.3 mEq/L (ref 3.5–5.1)
Sodium: 138 mEq/L (ref 135–145)

## 2019-02-04 ENCOUNTER — Other Ambulatory Visit: Payer: Self-pay | Admitting: Family Medicine

## 2019-02-05 ENCOUNTER — Encounter
Admission: RE | Admit: 2019-02-05 | Discharge: 2019-02-05 | Disposition: A | Discharge status: Home / Self Care | Payer: PPO | Source: Ambulatory Visit | Attending: Orthopedic Surgery | Admitting: Orthopedic Surgery

## 2019-02-05 ENCOUNTER — Other Ambulatory Visit: Payer: Self-pay

## 2019-02-05 DIAGNOSIS — Z01810 Encounter for preprocedural cardiovascular examination: Secondary | ICD-10-CM | POA: Diagnosis not present

## 2019-02-05 DIAGNOSIS — Z01818 Encounter for other preprocedural examination: Secondary | ICD-10-CM | POA: Insufficient documentation

## 2019-02-05 DIAGNOSIS — Z20828 Contact with and (suspected) exposure to other viral communicable diseases: Secondary | ICD-10-CM | POA: Diagnosis not present

## 2019-02-05 HISTORY — DX: Other intervertebral disc displacement, lumbar region: M51.26

## 2019-02-05 HISTORY — DX: Other intervertebral disc degeneration, lumbar region: M51.36

## 2019-02-05 HISTORY — DX: Other intervertebral disc degeneration, lumbar region without mention of lumbar back pain or lower extremity pain: M51.369

## 2019-02-05 HISTORY — DX: Psoriasis, unspecified: L40.9

## 2019-02-05 LAB — COMPREHENSIVE METABOLIC PANEL
ALT: 14 U/L (ref 0–44)
AST: 19 U/L (ref 15–41)
Albumin: 3.8 g/dL (ref 3.5–5.0)
Alkaline Phosphatase: 110 U/L (ref 38–126)
Anion gap: 8 (ref 5–15)
BUN: 8 mg/dL (ref 8–23)
CO2: 28 mmol/L (ref 22–32)
Calcium: 9.2 mg/dL (ref 8.9–10.3)
Chloride: 100 mmol/L (ref 98–111)
Creatinine, Ser: 0.75 mg/dL (ref 0.44–1.00)
GFR calc Af Amer: >60 mL/min (ref >60)
GFR calc non Af Amer: >60 mL/min (ref >60)
Glucose, Bld: 96 mg/dL (ref 70–99)
Potassium: 3.6 mmol/L (ref 3.5–5.1)
Sodium: 136 mmol/L (ref 135–145)
Total Bilirubin: 0.5 mg/dL (ref 0.3–1.2)
Total Protein: 7 g/dL (ref 6.5–8.1)

## 2019-02-05 LAB — URINALYSIS, ROUTINE W REFLEX MICROSCOPIC
Bacteria, UA: NONE SEEN
Bilirubin Urine: NEGATIVE
Glucose, UA: NEGATIVE mg/dL
Hgb urine dipstick: NEGATIVE
Ketones, ur: NEGATIVE mg/dL
Nitrite: NEGATIVE
Protein, ur: NEGATIVE mg/dL
Specific Gravity, Urine: 1.011 (ref 1.005–1.030)
pH: 6 (ref 5.0–8.0)

## 2019-02-05 LAB — PROTIME-INR
INR: 0.9 (ref 0.8–1.2)
Prothrombin Time: 11.5 seconds (ref 11.4–15.2)

## 2019-02-05 LAB — TYPE AND SCREEN
ABO/RH(D): O POS
Antibody Screen: NEGATIVE

## 2019-02-05 LAB — SURGICAL PCR SCREEN
MRSA, PCR: NEGATIVE
Staphylococcus aureus: POSITIVE — AB

## 2019-02-05 LAB — SEDIMENTATION RATE: Sed Rate: 15 mm/hr (ref 0–30)

## 2019-02-05 LAB — C-REACTIVE PROTEIN: CRP: <0.8 mg/dL (ref <1.0)

## 2019-02-05 LAB — CBC
HCT: 44.6 % (ref 36.0–46.0)
Hemoglobin: 14.7 g/dL (ref 12.0–15.0)
MCH: 32 pg (ref 26.0–34.0)
MCHC: 33 g/dL (ref 30.0–36.0)
MCV: 97.2 fL (ref 80.0–100.0)
Platelets: 265 10*3/uL (ref 150–400)
RBC: 4.59 MIL/uL (ref 3.87–5.11)
RDW: 13.5 % (ref 11.5–15.5)
WBC: 8 10*3/uL (ref 4.0–10.5)
nRBC: 0 % (ref 0.0–0.2)

## 2019-02-05 LAB — APTT: aPTT: 26 seconds (ref 24–36)

## 2019-02-05 NOTE — Patient Instructions (Signed)
Your procedure is scheduled on: Mon 8/17 Report to Day Surgery. To find out your arrival time please call [phone] between 1PM - 3PM on Friday 8/14.  Remember: Instructions that are not followed completely may result in serious medical risk,  up to and including death, or upon the discretion of your surgeon and anesthesiologist your  surgery may need to be rescheduled.     _X__ 1. Do not eat food after midnight the night before your procedure.                 No gum chewing or hard candies. You may drink clear liquids up to 2 hours                 before you are scheduled to arrive for your surgery- DO not drink clear                 liquids within 2 hours of the start of your surgery.                 Clear Liquids include:  water, apple juice without pulp, clear carbohydrate                 drink such as Clearfast of Gatorade, Black Coffee or Tea (Do not add                 anything to coffee or tea).  __X__2.  On the morning of surgery brush your teeth with toothpaste and water, you                may rinse your mouth with mouthwash if you wish.  Do not swallow any toothpaste of mouthwash.     _X__ 3.  No Alcohol for 24 hours before or after surgery.   __ 4.  Do Not Smoke or use e-cigarettes For 24 Hours Prior to Your Surgery.                 Do not use any chewable tobacco products for at least 6 hours prior to                 surgery.  ____  5.  Bring all medications with you on the day of surgery if instructed.   __x__  6.  Notify your doctor if there is any change in your medical condition      (cold, fever, infections).     Do not wear jewelry, make-up, hairpins, clips or nail polish. Do not wear lotions, powders, or perfumes. You may wear deodorant. Do not shave 48 hours prior to surgery. Men may shave face and neck. Do not bring valuables to the hospital.    [facility] is not responsible for any belongings or valuables.  Contacts, dentures  or bridgework may not be worn into surgery. Leave your suitcase in the car. After surgery it may be brought to your room. For patients admitted to the hospital, discharge time is determined by your treatment team.   Patients discharged the day of surgery will not be allowed to drive home.   Please read over the following fact sheets that you were given:    __x__ Take these medicines the morning of surgery with A SIP OF WATER:    1. amLODipine (NORVASC) 5 MG tablet  2. atorvastatin (LIPITOR) 20 MG tablet  3. gabapentin (NEURONTIN) 300 MG capsule  4.traMADol (ULTRAM) 50 MG tablet if needed  5.  6.  ____ Fleet Enema (  as directed)   __x__ Use CHG Soap as directed  ____ Use inhalers on the day of surgery  ____ Stop metformin 2 days prior to surgery    ____ Take 1/2 of usual insulin dose the night before surgery. No insulin the morning          of surgery.   ____ Stop Coumadin/Plavix/aspirin on   _x___ Stop Anti-inflammatories no ibuprofen aleve or aspirin    May take tylenol    ____ Stop supplements until after surgery.    ____ Bring C-Pap to the hospital.

## 2019-02-05 NOTE — Telephone Encounter (Signed)
Okay to refill? LOV: 12/25/18 & NOV: 02/15/19

## 2019-02-06 DIAGNOSIS — M25562 Pain in left knee: Secondary | ICD-10-CM | POA: Diagnosis not present

## 2019-02-06 LAB — URINE CULTURE
Culture: <10000 — AB
Special Requests: NORMAL

## 2019-02-07 ENCOUNTER — Other Ambulatory Visit: Payer: Self-pay

## 2019-02-07 ENCOUNTER — Other Ambulatory Visit
Admission: RE | Admit: 2019-02-07 | Discharge: 2019-02-07 | Disposition: A | Discharge status: Home / Self Care | Payer: PPO | Source: Ambulatory Visit | Attending: Orthopedic Surgery | Admitting: Orthopedic Surgery

## 2019-02-07 DIAGNOSIS — Z01818 Encounter for other preprocedural examination: Secondary | ICD-10-CM | POA: Diagnosis not present

## 2019-02-07 LAB — SARS CORONAVIRUS 2 (TAT 6-24 HRS): SARS Coronavirus 2: NEGATIVE

## 2019-02-10 MED ORDER — TRANEXAMIC ACID-NACL 1000-0.7 MG/100ML-% IV SOLN
1000.0000 mg | INTRAVENOUS | Status: AC
  Administered 2019-02-11: 1000 mg via INTRAVENOUS
  Filled 2019-02-10: qty 100

## 2019-02-10 MED ORDER — CEFAZOLIN SODIUM-DEXTROSE 2-4 GM/100ML-% IV SOLN
2.0000 g | INTRAVENOUS | Status: AC
  Administered 2019-02-11: 2 g via INTRAVENOUS

## 2019-02-11 ENCOUNTER — Inpatient Hospital Stay: Payer: PPO | Admitting: Certified Registered Nurse Anesthetist

## 2019-02-11 ENCOUNTER — Other Ambulatory Visit: Payer: Self-pay

## 2019-02-11 ENCOUNTER — Inpatient Hospital Stay: Payer: PPO

## 2019-02-11 ENCOUNTER — Encounter
Admission: RE | Disposition: A | Discharge status: Home Health | Payer: Self-pay | Source: Home / Self Care | Attending: Orthopedic Surgery

## 2019-02-11 ENCOUNTER — Inpatient Hospital Stay
Admission: RE | Admit: 2019-02-11 | Discharge: 2019-02-13 | DRG: 470 | Disposition: A | Discharge status: Home Health | Payer: PPO | Attending: Orthopedic Surgery | Admitting: Orthopedic Surgery

## 2019-02-11 ENCOUNTER — Encounter: Payer: Self-pay | Admitting: Orthopedic Surgery

## 2019-02-11 DIAGNOSIS — Z471 Aftercare following joint replacement surgery: Secondary | ICD-10-CM | POA: Diagnosis not present

## 2019-02-11 DIAGNOSIS — I10 Essential (primary) hypertension: Secondary | ICD-10-CM | POA: Diagnosis not present

## 2019-02-11 DIAGNOSIS — Z6837 Body mass index (BMI) 37.0-37.9, adult: Secondary | ICD-10-CM

## 2019-02-11 DIAGNOSIS — E785 Hyperlipidemia, unspecified: Secondary | ICD-10-CM | POA: Diagnosis not present

## 2019-02-11 DIAGNOSIS — K219 Gastro-esophageal reflux disease without esophagitis: Secondary | ICD-10-CM | POA: Diagnosis not present

## 2019-02-11 DIAGNOSIS — E669 Obesity, unspecified: Secondary | ICD-10-CM | POA: Diagnosis not present

## 2019-02-11 DIAGNOSIS — Z96659 Presence of unspecified artificial knee joint: Secondary | ICD-10-CM

## 2019-02-11 DIAGNOSIS — Z96652 Presence of left artificial knee joint: Secondary | ICD-10-CM | POA: Diagnosis not present

## 2019-02-11 DIAGNOSIS — M1712 Unilateral primary osteoarthritis, left knee: Secondary | ICD-10-CM | POA: Diagnosis not present

## 2019-02-11 HISTORY — PX: KNEE ARTHROPLASTY: SHX992

## 2019-02-11 LAB — ABO/RH: ABO/RH(D): O POS

## 2019-02-11 SURGERY — ARTHROPLASTY, KNEE, TOTAL, USING IMAGELESS COMPUTER-ASSISTED NAVIGATION
Anesthesia: Spinal | Site: Knee | Laterality: Left

## 2019-02-11 MED ORDER — AMLODIPINE BESYLATE 5 MG PO TABS
5.0000 mg | ORAL_TABLET | Freq: Every day | ORAL | Status: DC
  Administered 2019-02-12 – 2019-02-13 (×2): 5 mg via ORAL
  Filled 2019-02-11 (×2): qty 1

## 2019-02-11 MED ORDER — LIDOCAINE HCL (CARDIAC) PF 100 MG/5ML IV SOSY
PREFILLED_SYRINGE | INTRAVENOUS | Status: DC | PRN
  Administered 2019-02-11: 100 mg via INTRAVENOUS

## 2019-02-11 MED ORDER — TRAZODONE HCL 50 MG PO TABS: 50.0000 mg | ORAL_TABLET | Freq: Every evening | ORAL | Status: DC | PRN

## 2019-02-11 MED ORDER — ATORVASTATIN CALCIUM 20 MG PO TABS
20.0000 mg | ORAL_TABLET | Freq: Every day | ORAL | Status: DC
  Administered 2019-02-12 – 2019-02-13 (×2): 20 mg via ORAL
  Filled 2019-02-11 (×2): qty 1

## 2019-02-11 MED ORDER — CELECOXIB 200 MG PO CAPS
200.0000 mg | ORAL_CAPSULE | Freq: Two times a day (BID) | ORAL | Status: DC
  Administered 2019-02-11 – 2019-02-13 (×4): 200 mg via ORAL
  Filled 2019-02-11 (×4): qty 1

## 2019-02-11 MED ORDER — BUPIVACAINE HCL (PF) 0.5 % IJ SOLN
INTRAMUSCULAR | Status: AC
  Filled 2019-02-11: qty 10

## 2019-02-11 MED ORDER — SENNOSIDES-DOCUSATE SODIUM 8.6-50 MG PO TABS
1.0000 | ORAL_TABLET | Freq: Two times a day (BID) | ORAL | Status: DC
  Administered 2019-02-11 – 2019-02-13 (×4): 1 via ORAL
  Filled 2019-02-11 (×4): qty 1

## 2019-02-11 MED ORDER — BUPIVACAINE HCL (PF) 0.5 % IJ SOLN
INTRAMUSCULAR | Status: DC | PRN
  Administered 2019-02-11: 3 mL

## 2019-02-11 MED ORDER — BUPIVACAINE HCL (PF) 0.25 % IJ SOLN
INTRAMUSCULAR | Status: AC
  Filled 2019-02-11: qty 120

## 2019-02-11 MED ORDER — MIDAZOLAM HCL 5 MG/5ML IJ SOLN
INTRAMUSCULAR | Status: DC | PRN
  Administered 2019-02-11 (×2): 1 mg via INTRAVENOUS

## 2019-02-11 MED ORDER — ONDANSETRON HCL 4 MG/2ML IJ SOLN
INTRAMUSCULAR | Status: AC
  Filled 2019-02-11: qty 2

## 2019-02-11 MED ORDER — GABAPENTIN 300 MG PO CAPS: 300.0000 mg | ORAL_CAPSULE | Freq: Once | ORAL | Status: DC

## 2019-02-11 MED ORDER — ONDANSETRON HCL 4 MG/2ML IJ SOLN: 4.0000 mg | Freq: Once | INTRAMUSCULAR | Status: DC | PRN

## 2019-02-11 MED ORDER — ENOXAPARIN SODIUM 30 MG/0.3ML ~~LOC~~ SOLN
30.0000 mg | Freq: Two times a day (BID) | SUBCUTANEOUS | Status: DC
  Administered 2019-02-12 – 2019-02-13 (×3): 30 mg via SUBCUTANEOUS
  Filled 2019-02-11 (×4): qty 0.3

## 2019-02-11 MED ORDER — PROPOFOL 500 MG/50ML IV EMUL
INTRAVENOUS | Status: DC | PRN
  Administered 2019-02-11: 50 ug/kg/min via INTRAVENOUS

## 2019-02-11 MED ORDER — FAMOTIDINE 20 MG PO TABS
20.0000 mg | ORAL_TABLET | Freq: Once | ORAL | Status: AC
  Administered 2019-02-11: 07:00:00 20 mg via ORAL

## 2019-02-11 MED ORDER — CEFAZOLIN SODIUM-DEXTROSE 2-4 GM/100ML-% IV SOLN
INTRAVENOUS | Status: AC
  Filled 2019-02-11: qty 100

## 2019-02-11 MED ORDER — DEXAMETHASONE SODIUM PHOSPHATE 10 MG/ML IJ SOLN
8.0000 mg | Freq: Once | INTRAMUSCULAR | Status: AC
  Administered 2019-02-11: 07:00:00 8 mg via INTRAVENOUS

## 2019-02-11 MED ORDER — GABAPENTIN 300 MG PO CAPS
300.0000 mg | ORAL_CAPSULE | Freq: Two times a day (BID) | ORAL | Status: DC
  Administered 2019-02-11 – 2019-02-13 (×4): 300 mg via ORAL
  Filled 2019-02-11 (×4): qty 1

## 2019-02-11 MED ORDER — ONDANSETRON HCL 4 MG/2ML IJ SOLN
INTRAMUSCULAR | Status: DC | PRN
  Administered 2019-02-11: 4 mg via INTRAVENOUS

## 2019-02-11 MED ORDER — CLOBETASOL PROPIONATE 0.05 % EX CREA
1.0000 "application " | TOPICAL_CREAM | Freq: Two times a day (BID) | CUTANEOUS | Status: DC | PRN
  Administered 2019-02-11: 1 via TOPICAL
  Filled 2019-02-11: qty 15

## 2019-02-11 MED ORDER — CHLORHEXIDINE GLUCONATE 4 % EX LIQD: 60.0000 mL | Freq: Once | CUTANEOUS | Status: DC

## 2019-02-11 MED ORDER — EPHEDRINE SULFATE 50 MG/ML IJ SOLN
INTRAMUSCULAR | Status: AC
  Filled 2019-02-11: qty 1

## 2019-02-11 MED ORDER — ACETAMINOPHEN 10 MG/ML IV SOLN
INTRAVENOUS | Status: AC
  Filled 2019-02-11: qty 100

## 2019-02-11 MED ORDER — PROPOFOL 10 MG/ML IV BOLUS
INTRAVENOUS | Status: AC
  Filled 2019-02-11: qty 20

## 2019-02-11 MED ORDER — DEXAMETHASONE SODIUM PHOSPHATE 10 MG/ML IJ SOLN
INTRAMUSCULAR | Status: AC
  Filled 2019-02-11: qty 1

## 2019-02-11 MED ORDER — OXYCODONE HCL 5 MG PO TABS
5.0000 mg | ORAL_TABLET | ORAL | Status: DC | PRN
  Administered 2019-02-11: 5 mg via ORAL
  Filled 2019-02-11: qty 1

## 2019-02-11 MED ORDER — METOCLOPRAMIDE HCL 10 MG PO TABS
10.0000 mg | ORAL_TABLET | Freq: Three times a day (TID) | ORAL | Status: DC
  Administered 2019-02-11 – 2019-02-13 (×7): 10 mg via ORAL
  Filled 2019-02-11 (×6): qty 1

## 2019-02-11 MED ORDER — ACETAMINOPHEN 10 MG/ML IV SOLN
1000.0000 mg | Freq: Four times a day (QID) | INTRAVENOUS | Status: AC
  Administered 2019-02-11 – 2019-02-12 (×4): 1000 mg via INTRAVENOUS
  Filled 2019-02-11 (×4): qty 100

## 2019-02-11 MED ORDER — SODIUM CHLORIDE 0.9 % IV SOLN
INTRAVENOUS | Status: DC
  Administered 2019-02-11 – 2019-02-12 (×3): via INTRAVENOUS

## 2019-02-11 MED ORDER — GLYCOPYRROLATE 0.2 MG/ML IJ SOLN
INTRAMUSCULAR | Status: AC
  Filled 2019-02-11: qty 1

## 2019-02-11 MED ORDER — PHENYLEPHRINE HCL (PRESSORS) 10 MG/ML IV SOLN
INTRAVENOUS | Status: DC | PRN
  Administered 2019-02-11: 200 ug via INTRAVENOUS

## 2019-02-11 MED ORDER — SODIUM CHLORIDE 0.9 % IV SOLN
INTRAVENOUS | Status: DC | PRN
  Administered 2019-02-11: 40 ug/min via INTRAVENOUS

## 2019-02-11 MED ORDER — GLYCOPYRROLATE 0.2 MG/ML IJ SOLN
INTRAMUSCULAR | Status: DC | PRN
  Administered 2019-02-11: 0.2 mg via INTRAVENOUS

## 2019-02-11 MED ORDER — ACETAMINOPHEN 10 MG/ML IV SOLN
INTRAVENOUS | Status: DC | PRN
  Administered 2019-02-11: 1000 mg via INTRAVENOUS

## 2019-02-11 MED ORDER — LACTATED RINGERS IV SOLN
INTRAVENOUS | Status: DC
  Administered 2019-02-11: 07:00:00 via INTRAVENOUS

## 2019-02-11 MED ORDER — CELECOXIB 200 MG PO CAPS
ORAL_CAPSULE | ORAL | Status: AC
  Administered 2019-02-11: 07:00:00 400 mg via ORAL
  Filled 2019-02-11: qty 2

## 2019-02-11 MED ORDER — NEOMYCIN-POLYMYXIN B GU 40-200000 IR SOLN
Status: AC
  Filled 2019-02-11: qty 2

## 2019-02-11 MED ORDER — FENTANYL CITRATE (PF) 100 MCG/2ML IJ SOLN
INTRAMUSCULAR | Status: AC
  Administered 2019-02-11: 11:00:00 25 ug via INTRAVENOUS
  Filled 2019-02-11: qty 2

## 2019-02-11 MED ORDER — CELECOXIB 200 MG PO CAPS
400.0000 mg | ORAL_CAPSULE | Freq: Once | ORAL | Status: AC
  Administered 2019-02-11: 07:00:00 400 mg via ORAL

## 2019-02-11 MED ORDER — PROPOFOL 500 MG/50ML IV EMUL
INTRAVENOUS | Status: AC
  Filled 2019-02-11: qty 50

## 2019-02-11 MED ORDER — FERROUS SULFATE 325 (65 FE) MG PO TABS
325.0000 mg | ORAL_TABLET | Freq: Two times a day (BID) | ORAL | Status: DC
  Administered 2019-02-11 – 2019-02-13 (×4): 325 mg via ORAL
  Filled 2019-02-11 (×3): qty 1

## 2019-02-11 MED ORDER — METOCLOPRAMIDE HCL 5 MG/ML IJ SOLN: 5.0000 mg | Freq: Three times a day (TID) | INTRAMUSCULAR | Status: DC | PRN

## 2019-02-11 MED ORDER — SODIUM CHLORIDE 0.9 % IV SOLN
INTRAVENOUS | Status: DC | PRN
  Administered 2019-02-11: 60 mL

## 2019-02-11 MED ORDER — FAMOTIDINE 20 MG PO TABS
ORAL_TABLET | ORAL | Status: AC
  Administered 2019-02-11: 07:00:00 20 mg via ORAL
  Filled 2019-02-11: qty 1

## 2019-02-11 MED ORDER — MIDAZOLAM HCL 2 MG/2ML IJ SOLN
INTRAMUSCULAR | Status: AC
  Filled 2019-02-11: qty 2

## 2019-02-11 MED ORDER — PHENOL 1.4 % MT LIQD
1.0000 | OROMUCOSAL | Status: DC | PRN
  Filled 2019-02-11: qty 177

## 2019-02-11 MED ORDER — FENTANYL CITRATE (PF) 100 MCG/2ML IJ SOLN
25.0000 ug | INTRAMUSCULAR | Status: DC | PRN
  Administered 2019-02-11 (×4): 25 ug via INTRAVENOUS

## 2019-02-11 MED ORDER — SODIUM CHLORIDE FLUSH 0.9 % IV SOLN
INTRAVENOUS | Status: AC
  Filled 2019-02-11: qty 80

## 2019-02-11 MED ORDER — ACETAMINOPHEN 325 MG PO TABS: 325.0000 mg | ORAL_TABLET | Freq: Four times a day (QID) | ORAL | Status: DC | PRN

## 2019-02-11 MED ORDER — MAGNESIUM HYDROXIDE 400 MG/5ML PO SUSP
30.0000 mL | Freq: Every day | ORAL | Status: DC
  Administered 2019-02-11 – 2019-02-13 (×3): 30 mL via ORAL
  Filled 2019-02-11 (×3): qty 30

## 2019-02-11 MED ORDER — BUPIVACAINE LIPOSOME 1.3 % IJ SUSP
INTRAMUSCULAR | Status: AC
  Filled 2019-02-11: qty 40

## 2019-02-11 MED ORDER — EPHEDRINE SULFATE 50 MG/ML IJ SOLN
INTRAMUSCULAR | Status: DC | PRN
  Administered 2019-02-11: 10 mg via INTRAVENOUS

## 2019-02-11 MED ORDER — PHENYLEPHRINE HCL (PRESSORS) 10 MG/ML IV SOLN
INTRAVENOUS | Status: AC
  Filled 2019-02-11: qty 1

## 2019-02-11 MED ORDER — DIPHENHYDRAMINE HCL 12.5 MG/5ML PO ELIX: 12.5000 mg | ORAL_SOLUTION | ORAL | Status: DC | PRN

## 2019-02-11 MED ORDER — OXYCODONE HCL 5 MG PO TABS
10.0000 mg | ORAL_TABLET | ORAL | Status: DC | PRN
  Administered 2019-02-11 (×2): 10 mg via ORAL
  Filled 2019-02-11 (×2): qty 2

## 2019-02-11 MED ORDER — METOCLOPRAMIDE HCL 10 MG PO TABS: 5.0000 mg | ORAL_TABLET | Freq: Three times a day (TID) | ORAL | Status: DC | PRN

## 2019-02-11 MED ORDER — BUPIVACAINE HCL (PF) 0.25 % IJ SOLN
INTRAMUSCULAR | Status: DC | PRN
  Administered 2019-02-11: 60 mL

## 2019-02-11 MED ORDER — NYSTATIN 100000 UNIT/GM EX CREA
1.0000 "application " | TOPICAL_CREAM | Freq: Two times a day (BID) | CUTANEOUS | Status: DC
  Administered 2019-02-11 – 2019-02-12 (×2): 1 via TOPICAL
  Filled 2019-02-11: qty 15

## 2019-02-11 MED ORDER — TRANEXAMIC ACID-NACL 1000-0.7 MG/100ML-% IV SOLN
1000.0000 mg | Freq: Once | INTRAVENOUS | Status: AC
  Administered 2019-02-11: 1000 mg via INTRAVENOUS
  Filled 2019-02-11: qty 100

## 2019-02-11 MED ORDER — DEXAMETHASONE SODIUM PHOSPHATE 10 MG/ML IJ SOLN
INTRAMUSCULAR | Status: AC
  Administered 2019-02-11: 8 mg via INTRAVENOUS
  Filled 2019-02-11: qty 1

## 2019-02-11 MED ORDER — ENSURE ENLIVE PO LIQD: 296.0000 mL | Freq: Once | ORAL | Status: DC

## 2019-02-11 MED ORDER — PROPOFOL 10 MG/ML IV BOLUS
INTRAVENOUS | Status: DC | PRN
  Administered 2019-02-11 (×3): 20 mg via INTRAVENOUS

## 2019-02-11 MED ORDER — BISACODYL 10 MG RE SUPP
10.0000 mg | Freq: Every day | RECTAL | Status: DC | PRN
  Administered 2019-02-13: 10 mg via RECTAL
  Filled 2019-02-11: qty 1

## 2019-02-11 MED ORDER — ALUM & MAG HYDROXIDE-SIMETH 200-200-20 MG/5ML PO SUSP: 30.0000 mL | ORAL | Status: DC | PRN

## 2019-02-11 MED ORDER — MENTHOL 3 MG MT LOZG
1.0000 | LOZENGE | OROMUCOSAL | Status: DC | PRN
  Filled 2019-02-11: qty 9

## 2019-02-11 MED ORDER — HYDROMORPHONE HCL 1 MG/ML IJ SOLN: 0.5000 mg | INTRAMUSCULAR | Status: DC | PRN

## 2019-02-11 MED ORDER — ONDANSETRON HCL 4 MG PO TABS: 4.0000 mg | ORAL_TABLET | Freq: Four times a day (QID) | ORAL | Status: DC | PRN

## 2019-02-11 MED ORDER — ONDANSETRON HCL 4 MG/2ML IJ SOLN: 4.0000 mg | Freq: Four times a day (QID) | INTRAMUSCULAR | Status: DC | PRN

## 2019-02-11 MED ORDER — PANTOPRAZOLE SODIUM 40 MG PO TBEC
40.0000 mg | DELAYED_RELEASE_TABLET | Freq: Two times a day (BID) | ORAL | Status: DC
  Administered 2019-02-11 – 2019-02-13 (×5): 40 mg via ORAL
  Filled 2019-02-11 (×6): qty 1

## 2019-02-11 MED ORDER — TRAMADOL HCL 50 MG PO TABS
50.0000 mg | ORAL_TABLET | ORAL | Status: DC | PRN
  Administered 2019-02-12: 100 mg via ORAL
  Administered 2019-02-12: 50 mg via ORAL
  Administered 2019-02-12 – 2019-02-13 (×2): 100 mg via ORAL
  Filled 2019-02-11: qty 2
  Filled 2019-02-11 (×2): qty 1
  Filled 2019-02-11: qty 2
  Filled 2019-02-11: qty 1

## 2019-02-11 MED ORDER — NEOMYCIN-POLYMYXIN B GU 40-200000 IR SOLN
Status: DC | PRN
  Administered 2019-02-11: 14 mL

## 2019-02-11 MED ORDER — FLEET ENEMA 7-19 GM/118ML RE ENEM: 1.0000 | ENEMA | Freq: Once | RECTAL | Status: DC | PRN

## 2019-02-11 MED ORDER — LIDOCAINE HCL (PF) 2 % IJ SOLN
INTRAMUSCULAR | Status: AC
  Filled 2019-02-11: qty 10

## 2019-02-11 MED ORDER — TRIAMCINOLONE ACETONIDE 0.1 % EX CREA
1.0000 "application " | TOPICAL_CREAM | Freq: Two times a day (BID) | CUTANEOUS | Status: DC
  Administered 2019-02-11 – 2019-02-12 (×3): 1 via TOPICAL
  Filled 2019-02-11: qty 15

## 2019-02-11 MED ORDER — CEFAZOLIN SODIUM-DEXTROSE 2-4 GM/100ML-% IV SOLN
2.0000 g | Freq: Four times a day (QID) | INTRAVENOUS | Status: AC
  Administered 2019-02-11 – 2019-02-12 (×4): 2 g via INTRAVENOUS
  Filled 2019-02-11 (×4): qty 100

## 2019-02-11 SURGICAL SUPPLY — 73 items
ATTUNE PS FEM LT SZ 5 CEM KNEE (Femur) ×1 IMPLANT
ATTUNE PSRP INSR SZ 5 10M KNEE (Insert) ×1 IMPLANT
BASE TIBIAL ROT PLAT SZ 5 KNEE (Knees) IMPLANT
BATTERY INSTRU NAVIGATION (MISCELLANEOUS) ×8 IMPLANT
BLADE SAW 70X12.5 (BLADE) ×2 IMPLANT
BLADE SAW 90X13X1.19 OSCILLAT (BLADE) ×2 IMPLANT
BLADE SAW 90X25X1.19 OSCILLAT (BLADE) ×2 IMPLANT
BONE CEMENT GENTAMICIN (Cement) ×4 IMPLANT
CANISTER SUCT 3000ML PPV (MISCELLANEOUS) ×2 IMPLANT
CEMENT BONE GENTAMICIN 40 (Cement) IMPLANT
COOLER POLAR GLACIER W/PUMP (MISCELLANEOUS) ×2 IMPLANT
COVER WAND RF STERILE (DRAPES) ×2 IMPLANT
CUFF TOURN SGL QUICK 24 (TOURNIQUET CUFF)
CUFF TOURN SGL QUICK 30 (TOURNIQUET CUFF)
CUFF TOURN SGL QUICK 34 (TOURNIQUET CUFF) ×1
CUFF TRNQT CYL 24X4X16.5-23 (TOURNIQUET CUFF) IMPLANT
CUFF TRNQT CYL 30X4X21-28X (TOURNIQUET CUFF) IMPLANT
CUFF TRNQT CYL 34X4.125X (TOURNIQUET CUFF) IMPLANT
DRAPE 3/4 80X56 (DRAPES) ×2 IMPLANT
DRSG DERMACEA 8X12 NADH (GAUZE/BANDAGES/DRESSINGS) ×2 IMPLANT
DRSG OPSITE POSTOP 4X14 (GAUZE/BANDAGES/DRESSINGS) ×2 IMPLANT
DRSG TEGADERM 4X4.75 (GAUZE/BANDAGES/DRESSINGS) ×2 IMPLANT
DURAPREP 26ML APPLICATOR (WOUND CARE) ×4 IMPLANT
ELECT REM PT RETURN 9FT ADLT (ELECTROSURGICAL) ×2
ELECTRODE REM PT RTRN 9FT ADLT (ELECTROSURGICAL) ×1 IMPLANT
EX-PIN ORTHOLOCK NAV 4X150 (PIN) ×4 IMPLANT
GLOVE BIOGEL M STRL SZ7.5 (GLOVE) ×4 IMPLANT
GLOVE INDICATOR 8.0 STRL GRN (GLOVE) ×2 IMPLANT
GOWN STRL REUS W/ TWL LRG LVL3 (GOWN DISPOSABLE) ×2 IMPLANT
GOWN STRL REUS W/TWL LRG LVL3 (GOWN DISPOSABLE) ×2
HEMOVAC 400CC 10FR (MISCELLANEOUS) ×2 IMPLANT
HOLDER FOLEY CATH W/STRAP (MISCELLANEOUS) ×2 IMPLANT
HOOD PEEL AWAY FLYTE STAYCOOL (MISCELLANEOUS) ×4 IMPLANT
KIT TURNOVER KIT A (KITS) ×2 IMPLANT
KNIFE SCULPS 14X20 (INSTRUMENTS) ×2 IMPLANT
LABEL OR SOLS (LABEL) ×2 IMPLANT
MANIFOLD NEPTUNE II (INSTRUMENTS) ×2 IMPLANT
NDL SAFETY ECLIPSE 18X1.5 (NEEDLE) ×1 IMPLANT
NDL SPNL 20GX3.5 QUINCKE YW (NEEDLE) ×2 IMPLANT
NEEDLE HYPO 18GX1.5 SHARP (NEEDLE) ×1
NEEDLE SPNL 20GX3.5 QUINCKE YW (NEEDLE) ×4 IMPLANT
NS IRRIG 500ML POUR BTL (IV SOLUTION) ×2 IMPLANT
PACK TOTAL KNEE (MISCELLANEOUS) ×2 IMPLANT
PAD WRAPON POLAR KNEE (MISCELLANEOUS) ×1 IMPLANT
PAD WRAPON POLOR MULTI XL (MISCELLANEOUS) IMPLANT
PATELLA MEDIAL ATTUN 35MM KNEE (Knees) ×1 IMPLANT
PENCIL SMOKE ULTRAEVAC 22 CON (MISCELLANEOUS) ×2 IMPLANT
PIN DRILL QUICK PACK ×2 IMPLANT
PIN FIXATION 1/8DIA X 3INL (PIN) ×6 IMPLANT
PULSAVAC PLUS IRRIG FAN TIP (DISPOSABLE) ×2
SOL .9 NS 3000ML IRR  AL (IV SOLUTION) ×1
SOL .9 NS 3000ML IRR UROMATIC (IV SOLUTION) ×1 IMPLANT
SOL PREP PVP 2OZ (MISCELLANEOUS) ×2
SOLUTION PREP PVP 2OZ (MISCELLANEOUS) ×1 IMPLANT
SPONGE DRAIN TRACH 4X4 STRL 2S (GAUZE/BANDAGES/DRESSINGS) ×2 IMPLANT
STAPLER SKIN PROX 35W (STAPLE) ×2 IMPLANT
STOCKINETTE IMPERV 14X48 (MISCELLANEOUS) ×1 IMPLANT
STRAP TIBIA SHORT (MISCELLANEOUS) ×2 IMPLANT
SUCTION FRAZIER HANDLE 10FR (MISCELLANEOUS) ×1
SUCTION TUBE FRAZIER 10FR DISP (MISCELLANEOUS) ×1 IMPLANT
SUT VIC AB 0 CT1 36 (SUTURE) ×3 IMPLANT
SUT VIC AB 1 CT1 36 (SUTURE) ×4 IMPLANT
SUT VIC AB 2-0 CT2 27 (SUTURE) ×2 IMPLANT
SYR 20ML LL LF (SYRINGE) ×2 IMPLANT
SYR 30ML LL (SYRINGE) ×4 IMPLANT
TIBIAL BASE ROT PLAT SZ 5 KNEE (Knees) ×2 IMPLANT
TIP FAN IRRIG PULSAVAC PLUS (DISPOSABLE) ×1 IMPLANT
TOWEL OR 17X26 4PK STRL BLUE (TOWEL DISPOSABLE) ×2 IMPLANT
TOWER CARTRIDGE SMART MIX (DISPOSABLE) ×2 IMPLANT
TRAY FOLEY MTR SLVR 16FR STAT (SET/KITS/TRAYS/PACK) ×2 IMPLANT
WRAP-ON POLOR PAD MULTI XL (MISCELLANEOUS) ×1
WRAPON POLAR PAD KNEE (MISCELLANEOUS)
WRAPON POLOR PAD MULTI XL (MISCELLANEOUS) ×1

## 2019-02-11 NOTE — Transfer of Care (Signed)
Immediate Anesthesia Transfer of Care Note  Patient: Alice Campbell  Procedure(s) Performed: COMPUTER ASSISTED TOTAL KNEE ARTHROPLASTY - RNFA (Left Knee)  Patient Location: PACU  Anesthesia Type:Spinal  Level of Consciousness: awake, alert , oriented and patient cooperative  Airway & Oxygen Therapy: Patient Spontanous Breathing  Post-op Assessment: Report given to RN and Post -op Vital signs reviewed and stable  Post vital signs: Reviewed and stable  Last Vitals:  Vitals Value Taken Time  BP 104/65 02/11/19 1054  Temp 98.7   Pulse 100 02/11/19 1055  Resp 23 02/11/19 1055  SpO2 96 % 02/11/19 1055  Vitals shown include unvalidated device data.  Last Pain:  Vitals:   02/11/19 0628  TempSrc: Tympanic  PainSc: 0-No pain         Complications: No apparent anesthesia complications

## 2019-02-11 NOTE — Evaluation (Signed)
Physical Therapy Evaluation Patient Details Name: Alice Campbell MRN: [id number] DOB: 03/04/1951 Today's Date: 02/11/2019   History of Present Illness  Pt is a 68 yo female diagnosed with degenerative arthrosis of the left knee and is s/p elective L TKA.  PMH includes HTN, arthritis, GERD, HLD, and bulging lumbar disc.    Clinical Impression  Pt presents with deficits in strength, transfers, mobility, gait, balance, L knee ROM, and activity tolerance but overall performed very well during the session especially considering POD#0 status.  Pt was Mod Ind with sup to/from sit requiring some extra time and effort to get her LLE out of bed but did not require physical assistance.  Pt was able to stand from the EOB with CGA with good control and stability.  Pt was able to amb 10' with a RW and CGA with step-to, mildly antalgic gait pattern but was steady with no instability/LLE buckling.  Pt should make good progress while in acute care and will benefit from HHPT services upon discharge to safely address above deficits for decreased caregiver assistance and eventual return to PLOF.      Follow Up Recommendations Home health PT;Supervision for mobility/OOB    Equipment Recommendations  None recommended by PT    Recommendations for Other Services       Precautions / Restrictions Precautions Precautions: Fall Restrictions Weight Bearing Restrictions: Yes LLE Weight Bearing: Weight bearing as tolerated Other Position/Activity Restrictions: Pt able to perform Ind LLE SLR without extensor lag, no KI required      Mobility  Bed Mobility Overal bed mobility: Modified Independent             General bed mobility comments: Extra time and effort required with sup to sit but pt did not need physical assistance  Transfers Overall transfer level: Needs assistance Equipment used: Rolling walker (2 wheeled) Transfers: Sit to/from Stand Sit to Stand: Min guard         General transfer  comment: Mod verbal and visual cues/training for proper sequencing with transfers  Ambulation/Gait Ambulation/Gait assistance: Min guard Gait Distance (Feet): 10 Feet Assistive device: Rolling walker (2 wheeled) Gait Pattern/deviations: Step-to pattern;Decreased stance time - left;Decreased step length - right;Antalgic Gait velocity: decreased   General Gait Details: Pt able to amb 10' forwards/backwards staying near EOB/recliner for safety with decreased LLE stance time but was steady without LOB or signs of LLE instability.  Stairs            Wheelchair Mobility    Modified Rankin (Stroke Patients Only)       Balance Overall balance assessment: Needs assistance   Sitting balance-Leahy Scale: Good     Standing balance support: Bilateral upper extremity supported Standing balance-Leahy Scale: Good Standing balance comment: Min to mod lean on the RW in standing/during amb                             Pertinent Vitals/Pain Pain Assessment: 0-10 Pain Score: 5  Pain Location: L knee pain Pain Descriptors / Indicators: Aching;Sore Pain Intervention(s): Premedicated before session;Monitored during session    Home Living Family/patient expects to be discharged to:: Private residence Living Arrangements: Spouse/significant other Available Help at Discharge: Family;Available 24 hours/day Type of Home: House Home Access: Ramped entrance     Home Layout: Two level;Able to live on main level with bedroom/bathroom(2nd story unfinished) Home Equipment: Dan HumphreysWalker - 2 wheels;Walker - 4 wheels;Cane - single point;Shower seat;Bedside commode  Prior Function Level of Independence: Independent with assistive device(s)         Comments: Mod Ind amb limited community distances (limited by L knee pain).  One fall in the last 6 months when tripped on rug, Ind with ADLs     Hand Dominance        Extremity/Trunk Assessment   Upper Extremity Assessment Upper  Extremity Assessment: Defer to OT evaluation    Lower Extremity Assessment Lower Extremity Assessment: Generalized weakness;LLE deficits/detail LLE Deficits / Details: Pt able to perform Ind LLE SLR without extensor lag LLE: Unable to fully assess due to pain LLE Sensation: WNL       Communication   Communication: No difficulties  Cognition Arousal/Alertness: Awake/alert Behavior During Therapy: WFL for tasks assessed/performed Overall Cognitive Status: Within Functional Limits for tasks assessed                                        General Comments      Exercises Total Joint Exercises Ankle Circles/Pumps: Strengthening;AROM;Both;10 reps;15 reps Quad Sets: AROM;Strengthening;Both;10 reps;15 reps Hip ABduction/ADduction: AROM;Both;5 reps Straight Leg Raises: AROM;Both;5 reps Long Arc Quad: AROM;Strengthening;10 reps;15 reps;Left Knee Flexion: AROM;Strengthening;Left;10 reps;15 reps Marching in Standing: AROM;Both;10 reps;Standing  L knee AROM: 2-71 deg limited by wrapping  Other Exercises: Positioning education/review to encourage L knee ext PROM Other Exercises: HEP education/review per handout Multiple sit to/from stand transfer training from various height surfaces with cues for sequencing   Assessment/Plan    PT Assessment Patient needs continued PT services  PT Problem List Decreased strength;Decreased range of motion;Decreased activity tolerance;Decreased balance;Decreased knowledge of use of DME;Decreased mobility       PT Treatment Interventions DME instruction;Gait training;Functional mobility training;Therapeutic activities;Stair training;Therapeutic exercise;Balance training;Patient/family education    PT Goals (Current goals can be found in the Care Plan section)  Acute Rehab PT Goals Patient Stated Goal: To be able to walk increased distances PT Goal Formulation: With patient Time For Goal Achievement: 02/24/19 Potential to Achieve  Goals: Good    Frequency BID   Barriers to discharge        Co-evaluation               AM-PAC PT "6 Clicks" Mobility  Outcome Measure Help needed turning from your back to your side while in a flat bed without using bedrails?: A Little Help needed moving from lying on your back to sitting on the side of a flat bed without using bedrails?: A Little Help needed moving to and from a bed to a chair (including a wheelchair)?: A Little Help needed standing up from a chair using your arms (e.g., wheelchair or bedside chair)?: A Little Help needed to walk in hospital room?: A Little Help needed climbing 3-5 steps with a railing? : A Little 6 Click Score: 18    End of Session Equipment Utilized During Treatment: Gait belt Activity Tolerance: Patient tolerated treatment well Patient left: in chair;Other (comment);with SCD's reapplied;with call bell/phone within reach;with chair alarm set;with nursing/sitter in room(Polar care donned to L knee) Nurse Communication: Mobility status PT Visit Diagnosis: Other abnormalities of gait and mobility (R26.89);Muscle weakness (generalized) (M62.81)    Time: [id number] PT Time Calculation (min) (ACUTE ONLY): 53 min   Charges:   PT Evaluation $PT Eval Moderate Complexity: 1 Mod PT Treatments $Therapeutic Exercise: 8-22 mins $Therapeutic Activity: 8-22 mins  Linus Salmons PT, DPT 02/11/19, 4:44 PM

## 2019-02-11 NOTE — Anesthesia Preprocedure Evaluation (Addendum)
Anesthesia Evaluation  Patient identified by MRN, date of birth, ID band Patient awake    Reviewed: Allergy & Precautions, NPO status , Patient's Chart, lab work & pertinent test results  History of Anesthesia Complications Negative for: history of anesthetic complications  Airway Mallampati: III  TM Distance: >3 FB Neck ROM: Full    Dental  (+) Poor Dentition   Pulmonary neg sleep apnea, neg COPD, former smoker,    breath sounds clear to auscultation- rhonchi (-) wheezing      Cardiovascular hypertension, Pt. on medications (-) CAD, (-) Past MI, (-) Cardiac Stents and (-) CABG  Rhythm:Regular Rate:Normal - Systolic murmurs and - Diastolic murmurs    Neuro/Psych neg Seizures negative neurological ROS  negative psych ROS   GI/Hepatic Neg liver ROS, GERD  ,  Endo/Other  negative endocrine ROSneg diabetes  Renal/GU negative Renal ROS     Musculoskeletal  (+) Arthritis ,   Abdominal (+) + obese,   Peds  Hematology negative hematology ROS (+)   Anesthesia Other Findings Past Medical History: No date: Arthritis No date: Bulging lumbar disc No date: Chicken pox No date: GERD (gastroesophageal reflux disease) No date: History of UTI     Comment:  [facility] No date: Hyperlipidemia No date: Hypertension No date: Psoriasis   Reproductive/Obstetrics                             Lab Results  Component Value Date   WBC 8.0 02/05/2019   HGB 14.7 02/05/2019   HCT 44.6 02/05/2019   MCV 97.2 02/05/2019   PLT 265 02/05/2019    Anesthesia Physical Anesthesia Plan  ASA: II  Anesthesia Plan: Spinal   Post-op Pain Management:    Induction:   PONV Risk Score and Plan: 2  Airway Management Planned: Natural Airway  Additional Equipment:   Intra-op Plan:   Post-operative Plan:   Informed Consent: I have reviewed the patients History and Physical, chart, labs and discussed the  procedure including the risks, benefits and alternatives for the proposed anesthesia with the patient or authorized representative who has indicated his/her understanding and acceptance.     Dental advisory given  Plan Discussed with: CRNA and Anesthesiologist  Anesthesia Plan Comments:         Anesthesia Quick Evaluation

## 2019-02-11 NOTE — Discharge Instructions (Signed)
°  Instructions after Total Knee Replacement ° ° Demeisha Geraghty P. Jovaughn Wojtaszek, Jr., M.D.    ° Dept. of Orthopaedics & Sports Medicine ° [facility] ° [address] ° [place],   [address] ° Phone: [phone]   Fax: [phone] ° °  °DIET: °• Drink plenty of non-alcoholic fluids. °• Resume your normal diet. Include foods high in fiber. ° °ACTIVITY:  °• You may use crutches or a walker with weight-bearing as tolerated, unless instructed otherwise. °• You may be weaned off of the walker or crutches by your Physical Therapist.  °• Do NOT place pillows under the knee. Anything placed under the knee could limit your ability to straighten the knee.   °• Continue doing gentle exercises. Exercising will reduce the pain and swelling, increase motion, and prevent muscle weakness.   °• Please continue to use the TED compression stockings for 6 weeks. You may remove the stockings at night, but should reapply them in the morning. °• Do not drive or operate any equipment until instructed. ° °WOUND CARE:  °• Continue to use the PolarCare or ice packs periodically to reduce pain and swelling. °• You may bathe or shower after the staples are removed at the first office visit following surgery. ° °MEDICATIONS: °• You may resume your regular medications. °• Please take the pain medication as prescribed on the medication. °• Do not take pain medication on an empty stomach. °• You have been given a prescription for a blood thinner (Lovenox or Coumadin). Please take the medication as instructed. (NOTE: After completing a 2 week course of Lovenox, take one Enteric-coated aspirin once a day. This along with elevation will help reduce the possibility of phlebitis in your operated leg.) °• Do not drive or drink alcoholic beverages when taking pain medications. ° °CALL THE OFFICE FOR: °• Temperature above 101 degrees °• Excessive bleeding or drainage on the dressing. °• Excessive swelling, coldness, or paleness of the toes. °• Persistent  nausea and vomiting. ° °FOLLOW-UP:  °• You should have an appointment to return to the office in 10-14 days after surgery. °• Arrangements have been made for continuation of Physical Therapy (either home therapy or outpatient therapy). °  °

## 2019-02-11 NOTE — TOC Progression Note (Signed)
Transition of Care [facility]) - Progression Note    Patient Details  Name: Alice Campbell MRN: [id number] Date of Birth: [birth year]  Transition of [facility]) CM/SW [place], RN Phone Number: 02/11/2019, 12:04 PM  Clinical Narrative:     Requested the price of lovenox will notify the patient once obtained       Expected Discharge Plan and Services                                                 Social Determinants of Health (SDOH) Interventions    Readmission Risk Interventions No flowsheet data found.

## 2019-02-11 NOTE — Anesthesia Procedure Notes (Signed)
Spinal  Patient location during procedure: OR Start time: 02/11/2019 7:17 AM End time: 02/11/2019 7:27 AM Staffing Anesthesiologist: Emmie Niemann, MD Resident/CRNA: Eben Burow, CRNA Performed: anesthesiologist  Preanesthetic Checklist Completed: patient identified, site marked, surgical consent, pre-op evaluation, timeout performed, IV checked, risks and benefits discussed and monitors and equipment checked Spinal Block Patient position: sitting Prep: ChloraPrep Patient monitoring: heart rate, continuous pulse ox and blood pressure Approach: midline Location: L3-4 Injection technique: single-shot Needle Needle type: Introducer and Pencil-Tip  Needle gauge: 24 G Needle length: 9 cm Assessment Sensory level: T4

## 2019-02-11 NOTE — Op Note (Signed)
OPERATIVE NOTE  DATE OF SURGERY:  02/11/2019  PATIENT NAME:  Alice Campbell   DOB: [birth year]  MRN: [id number]  PRE-OPERATIVE DIAGNOSIS: Degenerative arthrosis of the left knee, primary  POST-OPERATIVE DIAGNOSIS:  Same  PROCEDURE:  Left total knee arthroplasty using computer-assisted navigation  SURGEON:  Jena GaussJames P Verdene Creson, Jr. M.D.  ANESTHESIA: spinal  ESTIMATED BLOOD LOSS: 50 mL  FLUIDS REPLACED: 900 mL of crystalloid  TOURNIQUET TIME: 90 minutes  DRAINS: 2 medium Hemovac drains  SOFT TISSUE RELEASES: Anterior cruciate ligament, posterior cruciate ligament, deep and superficial medial collateral ligament, patellofemoral ligament  IMPLANTS UTILIZED: DePuy Attune size 5 posterior stabilized femoral component (cemented), size 5 rotating platform tibial component (cemented), 35 mm medialized dome patella (cemented), and a 10 mm stabilized rotating platform polyethylene insert.  INDICATIONS FOR SURGERY: Alice Campbell is a 68 y.o. year old female with a long history of progressive knee pain. X-rays demonstrated severe degenerative changes in tricompartmental fashion. The patient had not seen any significant improvement despite conservative nonsurgical intervention. After discussion of the risks and benefits of surgical intervention, the patient expressed understanding of the risks benefits and agree with plans for total knee arthroplasty.   The risks, benefits, and alternatives were discussed at length including but not limited to the risks of infection, bleeding, nerve injury, stiffness, blood clots, the need for revision surgery, cardiopulmonary complications, among others, and they were willing to proceed.  PROCEDURE IN DETAIL: The patient was brought into the operating room and, after adequate spinal anesthesia was achieved, a tourniquet was placed on the patient's upper thigh. The patient's knee and leg were cleaned and prepped with alcohol and DuraPrep and draped in the usual sterile  fashion. A "timeout" was performed as per usual protocol. The lower extremity was exsanguinated using an Esmarch, and the tourniquet was inflated to 300 mmHg. An anterior longitudinal incision was made followed by a standard mid vastus approach. The deep fibers of the medial collateral ligament were elevated in a subperiosteal fashion off of the medial flare of the tibia so as to maintain a continuous soft tissue sleeve. The patella was subluxed laterally and the patellofemoral ligament was incised. Inspection of the knee demonstrated severe degenerative changes with full-thickness loss of articular cartilage. Osteophytes were debrided using a rongeur. Anterior and posterior cruciate ligaments were excised. Two 4.0 mm Schanz pins were inserted in the femur and into the tibia for attachment of the array of trackers used for computer-assisted navigation. Hip center was identified using a circumduction technique. Distal landmarks were mapped using the computer. The distal femur and proximal tibia were mapped using the computer. The distal femoral cutting guide was positioned using computer-assisted navigation so as to achieve a 5 distal valgus cut. The femur was sized and it was felt that a size 5 femoral component was appropriate. A size 5 femoral cutting guide was positioned and the anterior cut was performed and verified using the computer. This was followed by completion of the posterior and chamfer cuts. Femoral cutting guide for the central box was then positioned in the center box cut was performed.  Attention was then directed to the proximal tibia. Medial and lateral menisci were excised. The extramedullary tibial cutting guide was positioned using computer-assisted navigation so as to achieve a 0 varus-valgus alignment and 3 posterior slope. The cut was performed and verified using the computer. The proximal tibia was sized and it was felt that a size 5 tibial tray was appropriate. Tibial and femoral  trials were  inserted followed by insertion of a 5 mm polyethylene insert. The knee was felt to be tight medially. A Cobb elevator was used to elevate the superficial fibers of the medial collateral ligament.  The 5 mm polyethylene trial was replaced by a 10 mm polyethylene trial.  This allowed for excellent mediolateral soft tissue balancing both in flexion and in full extension. Finally, the patella was cut and prepared so as to accommodate a 35 mm medialized dome patella. A patella trial was placed and the knee was placed through a range of motion with excellent patellar tracking appreciated. The femoral trial was removed after debridement of posterior osteophytes. The central post-hole for the tibial component was reamed followed by insertion of a keel punch. Tibial trials were then removed. Cut surfaces of bone were irrigated with copious amounts of normal saline with antibiotic solution using pulsatile lavage and then suctioned dry. Polymethylmethacrylate cement with gentamicin was prepared in the usual fashion using a vacuum mixer. Cement was applied to the cut surface of the proximal tibia as well as along the undersurface of a size 5 rotating platform tibial component. Tibial component was positioned and impacted into place. Excess cement was removed using [employer] fast streamer. Cement was then applied to the cut surfaces of the femur as well as along the posterior flanges of the size 5 femoral component. The femoral component was positioned and impacted into place. Excess cement was removed using [employer] fast streamer. A 10 mm polyethylene trial was inserted and the knee was brought into full extension with steady axial compression applied. Finally, cement was applied to the backside of a 35 mm medialized dome patella and the patellar component was positioned and patellar clamp applied. Excess cement was removed using [employer] fast streamer. After adequate curing of the cement, the tourniquet was deflated after a total  tourniquet time of 90 minutes. Hemostasis was achieved using electrocautery. The knee was irrigated with copious amounts of normal saline with antibiotic solution using pulsatile lavage and then suctioned dry. 20 mL of 1.3% Exparel and 60 mL of 0.25% Marcaine in 40 mL of normal saline was injected along the posterior capsule, medial and lateral gutters, and along the arthrotomy site. A 10 mm stabilized rotating platform polyethylene insert was inserted and the knee was placed through a range of motion with excellent mediolateral soft tissue balancing appreciated and excellent patellar tracking noted. 2 medium drains were placed in the wound bed and brought out through separate stab incisions. The medial parapatellar portion of the incision was reapproximated using interrupted sutures of #1 Vicryl. Subcutaneous tissue was approximated in layers using first #0 Vicryl followed #2-0 Vicryl. The skin was approximated with skin staples. A sterile dressing was applied.  The patient tolerated the procedure well and was transported to the recovery room in stable condition.    Liannah Yarbough P. Holley Bouche., M.D.

## 2019-02-11 NOTE — TOC Benefit Eligibility Note (Signed)
Transition of Care [facility]) Benefit Eligibility Note    Patient Details  Name: Alice Campbell MRN: [id number] Date of Birth: Apr 26, 1951   Medication/Dose: Enoxaparin 40mg  once daily for 14 days  Covered?: Yes  Tier: (Tier 4)  Prescription Coverage Preferred Pharmacy: [facility]  Spoke with Person/Company/Phone Number:: Chantall with [employer] at [phone]  Co-Pay: $90 estimated copay  Prior Approval: No  Deductible: (No deductible on plan.)   Dannette Barbara Phone Number: 02/11/2019, 2:15 PM

## 2019-02-11 NOTE — Anesthesia Post-op Follow-up Note (Signed)
Anesthesia QCDR form completed.        

## 2019-02-12 MED ORDER — OXYCODONE HCL 5 MG PO TABS: 5.0000 mg | ORAL_TABLET | ORAL | 0 refills | Status: DC | PRN

## 2019-02-12 MED ORDER — ENOXAPARIN SODIUM 40 MG/0.4ML ~~LOC~~ SOLN: 40.0000 mg | SUBCUTANEOUS | 0 refills | Status: DC

## 2019-02-12 MED ORDER — TRAMADOL HCL 50 MG PO TABS: 50.0000 mg | ORAL_TABLET | Freq: Four times a day (QID) | ORAL | 0 refills | Status: DC | PRN

## 2019-02-12 NOTE — Anesthesia Postprocedure Evaluation (Signed)
Anesthesia Post Note  Patient: Alice Campbell  Procedure(s) Performed: COMPUTER ASSISTED TOTAL KNEE ARTHROPLASTY - RNFA (Left Knee)  Patient location during evaluation: Nursing Unit Anesthesia Type: Spinal Level of consciousness: oriented and awake and alert Pain management: pain level controlled Vital Signs Assessment: post-procedure vital signs reviewed and stable Respiratory status: spontaneous breathing and respiratory function stable Cardiovascular status: blood pressure returned to baseline and stable Postop Assessment: no headache, no backache, no apparent nausea or vomiting and patient able to bend at knees Anesthetic complications: no     Last Vitals:  Vitals:   02/11/19 2328 02/12/19 0416  BP: 135/82 135/83  Pulse: 88 86  Resp: 14 15  Temp: 36.7 C 36.8 C  SpO2: 96% 97%    Last Pain:  Vitals:   02/12/19 0416  TempSrc: Oral  PainSc:                  Brantley Fling

## 2019-02-12 NOTE — Progress Notes (Signed)
Physical Therapy Treatment Patient Details Name: Alice Campbell: [id number] DOB: 05/02/1951 Today's Date: 02/12/2019    History of Present Illness Pt is a 68 yo female diagnosed with degenerative arthrosis of the left knee and is s/p elective L TKA.  PMH includes HTN, arthritis, GERD, HLD, and bulging lumbar disc.    PT Comments    Pt presents with deficits in strength, transfers, L knee ROM, gait, balance, and activity tolerance but overall is doing very well and making good progress towards goals.  Pt was able to perform sup to/from sit without the need of physical assistance or the use of the bed rail.  Pt was CGA/SBA with transfers with good carryover regarding proper sequencing.  Pt was able to amb 100' with a RW and CGA with good stability and with step-through gait pattern.  Pt's BP at rest was 108/69 mmHg with HR 87 bpm.  Pt's HR after amb was in the low 100s with no adverse symptoms reported during the session.  Pt will benefit from HHPT services upon discharge to safely address above deficits for decreased caregiver assistance and eventual return to PLOF.     Follow Up Recommendations  Home health PT;Supervision for mobility/OOB     Equipment Recommendations  None recommended by PT    Recommendations for Other Services       Precautions / Restrictions Precautions Precautions: Fall Restrictions Weight Bearing Restrictions: Yes LLE Weight Bearing: Weight bearing as tolerated Other Position/Activity Restrictions: Pt able to perform Ind LLE SLR without extensor lag, no KI required    Mobility  Bed Mobility Overal bed mobility: Modified Independent             General bed mobility comments: Pt able to perform sup to/from sit with extra time and effort but did not require use of the bed rail  Transfers Overall transfer level: Needs assistance Equipment used: Rolling walker (2 wheeled) Transfers: Sit to/from Stand Sit to Stand: Min guard;Supervision          General transfer comment: Good carryover with general sequencing including foot and hand positioning  Ambulation/Gait Ambulation/Gait assistance: Min guard Gait Distance (Feet): 100 Feet Assistive device: Rolling walker (2 wheeled) Gait Pattern/deviations: Step-through pattern;Decreased step length - right;Decreased step length - left;Antalgic Gait velocity: decreased   General Gait Details: Mildly antalgic gait on the LLE that improved during the session. Pt steady with amb with minimal lean on the RW.   Stairs             Wheelchair Mobility    Modified Rankin (Stroke Patients Only)       Balance Overall balance assessment: Needs assistance Sitting-balance support: No upper extremity supported;Feet supported Sitting balance-Leahy Scale: Normal     Standing balance support: Bilateral upper extremity supported Standing balance-Leahy Scale: Good                              Cognition Arousal/Alertness: Awake/alert Behavior During Therapy: WFL for tasks assessed/performed Overall Cognitive Status: Within Functional Limits for tasks assessed                                        Exercises Total Joint Exercises Ankle Circles/Pumps: Strengthening;AROM;Both;10 reps;15 reps Quad Sets: AROM;Strengthening;Both;10 reps;15 reps Gluteal Sets: Strengthening;Both;10 reps Heel Slides: AROM;Left;10 reps Hip ABduction/ADduction: AROM;5 reps;Right Straight Leg Raises: AROM;Right;10 reps Long  Arc Quad: AROM;Strengthening;10 reps;15 reps;Left Knee Flexion: AROM;Strengthening;Left;10 reps;15 reps Goniometric ROM: L knee AROM: 3-81 deg Marching in Standing: AROM;Both;10 reps;Standing Other Exercises Other Exercises: Positioning education/review to encourage L knee ext PROM Other Exercises: HEP education/review per handout Other Exercises: 90 deg left turn training to prevent CKC twisting on the L knee    General Comments General comments (skin  integrity, edema, etc.): dressing and hemovac in place      Pertinent Vitals/Pain Pain Assessment: 0-10 Pain Score: 2  Pain Location: L knee Pain Descriptors / Indicators: Sore Pain Intervention(s): Premedicated before session;Monitored during session    Home Living                      Prior Function            PT Goals (current goals can now be found in the care plan section) Acute Rehab PT Goals Patient Stated Goal: go home and return to PLOF with less pain Progress towards PT goals: Progressing toward goals    Frequency    BID      PT Plan Current plan remains appropriate    Co-evaluation              AM-PAC PT "6 Clicks" Mobility   Outcome Measure  Help needed turning from your back to your side while in a flat bed without using bedrails?: None Help needed moving from lying on your back to sitting on the side of a flat bed without using bedrails?: None Help needed moving to and from a bed to a chair (including a wheelchair)?: A Little Help needed standing up from a chair using your arms (e.g., wheelchair or bedside chair)?: A Little Help needed to walk in hospital room?: A Little Help needed climbing 3-5 steps with a railing? : A Little 6 Click Score: 20    End of Session Equipment Utilized During Treatment: Gait belt Activity Tolerance: Patient tolerated treatment well Patient left: in chair;Other (comment);with SCD's reapplied;with call bell/phone within reach;with chair alarm set(Polar care donned to L knee) Nurse Communication: Mobility status PT Visit Diagnosis: Other abnormalities of gait and mobility (R26.89);Muscle weakness (generalized) (M62.81)     Time: 8676-7209 PT Time Calculation (min) (ACUTE ONLY): 40 min  Charges:  $Gait Training: 8-22 mins $Therapeutic Exercise: 8-22 mins $Therapeutic Activity: 8-22 mins                     D. Scott Kree Rafter PT, DPT 02/12/19, 1:05 PM

## 2019-02-12 NOTE — TOC Transition Note (Signed)
Transition of Care [facility]) - CM/SW Discharge Note   Patient Details  Name: Alice Campbell MRN: [id number] Date of Birth: 1950/12/14  [facility] CM/SW Contact:  Su Hilt, RN Phone Number: 02/12/2019, 11:16 AM   Clinical Narrative:    Met with the patient to discuss DC plan and needs, She lives at home with her spouse and plans to return home, Has all DME needed including a RW, Cane, Shower chair and BSC, no additional DME needed Used [facility] with [facility] and would like to use them again, I notified [place] with [facility], Lovenox is $90 and the patient can afford her medications  Sees Dr Caryl Bis and is up to date Uses [facility] Transportation provided by spouse and family Final next level of care: Home w Home Health Services Barriers to Discharge: Barriers Resolved   Patient Goals and CMS Choice Patient states their goals for this hospitalization and ongoing recovery are:: go home CMS Medicare.gov Compare Post Acute Care list provided to:: Patient Choice offered to / list presented to : Patient  Discharge Placement                       Discharge Plan and Services   Discharge Planning Services: CM Consult Post Acute Care Choice: Home Health          DME Arranged: N/A         HH Arranged: PT HH Agency: [facility] Date [place]: 02/12/19 Time [place]: [id number] Representative spoke with at [place]: [place] ([place]) Interventions     Readmission Risk Interventions No flowsheet data found.

## 2019-02-12 NOTE — Progress Notes (Signed)
Physical Therapy Treatment Patient Details Name: Alice Campbell MRN: [id number] DOB: [birth year] Today's Date: 02/12/2019    History of Present Illness Pt is a 68 yo female diagnosed with degenerative arthrosis of the left knee and is s/p elective L TKA.  PMH includes HTN, arthritis, GERD, HLD, and bulging lumbar disc.    PT Comments    Pt in bed upon arrival and agreed to participate with PT. Pt is progressing very well towards goals. Pt performed bed mobility tasks modified independently requiring no physical assistance or use of bed rails but did require increased time. Pt presents with strong carryover between sessions with home exercises and sequencing of tasks. Pt progressed this session ambulating 200+ ft around nurses station and then to rehab gym and initiated stair training. Pt progressed to ambulating up/down two steps with B handrail use and step to pattern. Pt reports minimal pain this afternoon with pain decreasing once up and ambulating. Pt will benefit from continued skilled therapy to further improve ROM, strength, balance, gait and stair training.    Follow Up Recommendations  Home health PT;Supervision for mobility/OOB     Equipment Recommendations  None recommended by PT    Recommendations for Other Services       Precautions / Restrictions Precautions Precautions: Fall Restrictions Weight Bearing Restrictions: Yes LLE Weight Bearing: Weight bearing as tolerated Other Position/Activity Restrictions: Pt able to perform Ind LLE SLR without extensor lag, no KI required    Mobility  Bed Mobility Overal bed mobility: Modified Independent             General bed mobility comments: pt able to perform supine to/from sit with extra time and effort, did not require use of bed rail or cuing for sequencing  Transfers Overall transfer level: Needs assistance Equipment used: Rolling walker (2 wheeled) Transfers: Sit to/from Stand Sit to Stand: Min guard          General transfer comment: pt able to perform sit to stand with min guard assist for safety, good carryover with sequencing for foot and hand positioning, sit to stand x2 trials this session  Ambulation/Gait Ambulation/Gait assistance: Min guard Gait Distance (Feet): 200 Feet Assistive device: Rolling walker (2 wheeled) Gait Pattern/deviations: Step-through pattern;Decreased step length - right;Decreased step length - left;Antalgic Gait velocity: decreased   General Gait Details: pt ambulated around nurses station and to gym for initial stair training with on sitting rest break in room, pt ambulates with mildly antalgic gait, and minimal UE support on RW   Stairs Stairs: Yes Stairs assistance: Min guard Stair Management: Two rails;Step to pattern Number of Stairs: 2 General stair comments: pt performed initial stair training performing one step x2 trials with B hand rail use and vc for sequencing, pt progressed to 2 steps x2 trials with minimal cuing for correct sequencing, pt had no instances of unsteadiness or LOB   Wheelchair Mobility    Modified Rankin (Stroke Patients Only)       Balance Overall balance assessment: Needs assistance Sitting-balance support: Feet supported;No upper extremity supported Sitting balance-Leahy Scale: Normal     Standing balance support: Bilateral upper extremity supported;During functional activity Standing balance-Leahy Scale: Good Standing balance comment: min UE reliance on RW during standing and ambulation                            Cognition Arousal/Alertness: Awake/alert Behavior During Therapy: WFL for tasks assessed/performed Overall Cognitive Status: Within Functional Limits  for tasks assessed                                        Exercises    General Comments        Pertinent Vitals/Pain Pain Assessment: 0-10 Pain Score: 2  Pain Location: L knee Pain Descriptors / Indicators: Sore Pain  Intervention(s): Limited activity within patient's tolerance;Monitored during session;Premedicated before session    Home Living                      Prior Function            PT Goals (current goals can now be found in the care plan section) Progress towards PT goals: Progressing toward goals    Frequency    BID      PT Plan Current plan remains appropriate    Co-evaluation              AM-PAC PT "6 Clicks" Mobility   Outcome Measure  Help needed turning from your back to your side while in a flat bed without using bedrails?: None Help needed moving from lying on your back to sitting on the side of a flat bed without using bedrails?: None Help needed moving to and from a bed to a chair (including a wheelchair)?: A Little Help needed standing up from a chair using your arms (e.g., wheelchair or bedside chair)?: A Little Help needed to walk in hospital room?: A Little Help needed climbing 3-5 steps with a railing? : A Little 6 Click Score: 16    End of Session Equipment Utilized During Treatment: Gait belt Activity Tolerance: Patient tolerated treatment well Patient left: in bed;with call bell/phone within reach;with bed alarm set;with SCD's reapplied Nurse Communication: Mobility status PT Visit Diagnosis: Other abnormalities of gait and mobility (R26.89);Muscle weakness (generalized) (M62.81)     Time: [id number] PT Time Calculation (min) (ACUTE ONLY): 24 min  Charges:  $Gait Training: 23-37 mins $Therapeutic Exercise: 8-22 mins $Therapeutic Activity: 8-22 mins                     Siham Bucaro PT, DPT 4:19 PM,02/12/19 [phone]

## 2019-02-12 NOTE — Progress Notes (Signed)
   Subjective: 1 Day Post-Op Procedure(s) (LRB): COMPUTER ASSISTED TOTAL KNEE ARTHROPLASTY - RNFA (Left) Patient reports pain as 1 on 0-10 scale.   Patient is well, and has had no acute complaints or problems Patient did well on day of operation with therapy.  Was able to ambulate 10 feet. Plan is to go Home after hospital stay. no nausea and no vomiting Patient denies any chest pains or shortness of breath. Patient resting very well and voicing no complaints  Objective: Vital signs in last 24 hours: Temp:  [97.5 F (36.4 C)-98.7 F (37.1 C)] 98.3 F (36.8 C) (08/18 0416) Pulse Rate:  [73-99] 86 (08/18 0416) Resp:  [11-18] 15 (08/18 0416) BP: (94-135)/(61-83) 135/83 (08/18 0416) SpO2:  [94 %-100 %] 97 % (08/18 0416) Heels are non tender and elevated off the bed using rolled towels along with bone foam under operative heel  Intake/Output from previous day: 08/17 0701 - 08/18 0700 In: 2785.4 [P.O.:240; I.V.:1775.4; IV Piggyback:600] Out: 6962 [XBMWU:1324; Drains:30; Blood:50] Intake/Output this shift: No intake/output data recorded.  No results for input(s): HGB in the last 72 hours. No results for input(s): WBC, RBC, HCT, PLT in the last 72 hours. No results for input(s): NA, K, CL, CO2, BUN, CREATININE, GLUCOSE, CALCIUM in the last 72 hours. No results for input(s): LABPT, INR in the last 72 hours.  EXAM General - Patient is Alert, Appropriate and Oriented Extremity - Neurologically intact Neurovascular intact Sensation intact distally Intact pulses distally Dorsiflexion/Plantar flexion intact Compartment soft Dressing - dressing C/D/I Motor Function - intact, moving foot and toes well on exam.  Able to do straight leg raise on her own as well as flex her knee  Past Medical History:  Diagnosis Date  . Arthritis   . Bulging lumbar disc   . Chicken pox   . GERD (gastroesophageal reflux disease)   . History of UTI    [facility]  . Hyperlipidemia   .  Hypertension   . Psoriasis     Assessment/Plan: 1 Day Post-Op Procedure(s) (LRB): COMPUTER ASSISTED TOTAL KNEE ARTHROPLASTY - RNFA (Left) Active Problems:   Total knee replacement status  Estimated body mass index is 37.21 kg/m as calculated from the following:   Height as of this encounter: 5' 5.5" (1.664 m).   Weight as of this encounter: 103 kg. Advance diet Up with therapy D/C IV fluids Plan for discharge tomorrow Discharge home with home health  Labs: None DVT Prophylaxis - Lovenox, Foot Pumps and TED hose Weight-Bearing as tolerated to left leg D/C O2 and Pulse OX and try on Room Air Begin working on bowel movement  Eraina Winnie R. Byers [place] 02/12/2019, 7:19 AM

## 2019-02-12 NOTE — Evaluation (Signed)
Occupational Therapy Evaluation Patient Details Name: Alice Campbell MRN: [id number] DOB: 04-19-1951 Today's Date: 02/12/2019    History of Present Illness Pt is a 68 yo female diagnosed with degenerative arthrosis of the left knee and is s/p elective L TKA.  PMH includes HTN, arthritis, GERD, HLD, and bulging lumbar disc.   Clinical Impression   Pt seen for OT evaluation this date, POD#1 from above surgery. Pt was independent in all ADL prior to surgery, however occasionally using a rollator for mobility due to L knee pain. Pt is eager to return to PLOF with less pain and improved safety and independence. Pt currently requires minimal assist for LB dressing while in seated position due to pain and limited AROM of L knee. Pt able to perform functional mobility and transfers with CGA using the RW while negotiating environment and demonstrating good safety awareness. Pt instructed in polar care mgt, falls prevention strategies including pet care considerations, home/routines modifications, DME/AE for LB bathing and dressing tasks, and compression stocking mgt. Handout provided to support recall and carryover. Pt would benefit from skilled OT services including additional instruction in dressing techniques with or without assistive devices for dressing and bathing skills to support recall and carryover prior to discharge and ultimately to maximize safety, independence, and minimize falls risk and caregiver burden. Do not currently anticipate any OT needs following this hospitalization.      Follow Up Recommendations  No OT follow up    Equipment Recommendations  Other (comment)(consider grab bars and handheld showerhead)    Recommendations for Other Services       Precautions / Restrictions Precautions Precautions: Fall Restrictions Weight Bearing Restrictions: Yes LLE Weight Bearing: Weight bearing as tolerated Other Position/Activity Restrictions: Pt able to perform Ind LLE SLR without  extensor lag, no KI required      Mobility Bed Mobility Overal bed mobility: Modified Independent             General bed mobility comments: Able to perform sup>sit EOB without assist, did use bedrails to assist  Transfers Overall transfer level: Needs assistance Equipment used: Rolling walker (2 wheeled) Transfers: Sit to/from Stand Sit to Stand: Min guard         General transfer comment: good carryover for learned techniques to scoot EOB, bring B feet underneath her, and pushing up from bed with BUE    Balance Overall balance assessment: Needs assistance Sitting-balance support: No upper extremity supported;Feet supported Sitting balance-Leahy Scale: Good     Standing balance support: Bilateral upper extremity supported Standing balance-Leahy Scale: Good                             ADL either performed or assessed with clinical judgement   ADL Overall ADL's : Needs assistance/impaired                                       General ADL Comments: Min A for LB ADL without AE; Mod A for compression stocking mgt - spouse able to provide     Vision Baseline Vision/History: Wears glasses Wears Glasses: Reading only Patient Visual Report: No change from baseline Vision Assessment?: No apparent visual deficits     Perception     Praxis      Pertinent Vitals/Pain Pain Assessment: 0-10 Pain Score: 4  Pain Location: 2 at rest and up to  4/10 in the operative knee with functional mobility Pain Descriptors / Indicators: Aching Pain Intervention(s): Limited activity within patient's tolerance;Monitored during session;Repositioned;Ice applied;Premedicated before session     Hand Dominance Right   Extremity/Trunk Assessment Upper Extremity Assessment Upper Extremity Assessment: Overall WFL for tasks assessed   Lower Extremity Assessment Lower Extremity Assessment: Generalized weakness;LLE deficits/detail;Defer to PT evaluation LLE  Deficits / Details: expected post-op strength/ROM deficits, although not significantly limiting functional ability   Cervical / Trunk Assessment Cervical / Trunk Assessment: Normal   Communication Communication Communication: No difficulties   Cognition Arousal/Alertness: Awake/alert Behavior During Therapy: WFL for tasks assessed/performed Overall Cognitive Status: Within Functional Limits for tasks assessed                                     General Comments  dressing and hemovac in place    Exercises Other Exercises Other Exercises: Pt instructed in AE/DME for ADL, home/routines modifications, falls prevention including pet care considerations, polar care mgt, and compression stocking mgt; handout provided to support recall and carryover   Shoulder Instructions      Home Living Family/patient expects to be discharged to:: Private residence Living Arrangements: Spouse/significant other Available Help at Discharge: Family;Available 24 hours/day Type of Home: House Home Access: Ramped entrance     Home Layout: Two level;Able to live on main level with bedroom/bathroom(2nd story unfinished)     Bathroom Shower/Tub: Occupational psychologist: Handicapped height     Home Equipment: Environmental consultant - 2 wheels;Walker - 4 wheels;Cane - single point;Shower seat;Bedside commode;Adaptive equipment Adaptive Equipment: Reacher Additional Comments: getting new shower installed soon; will still have access to another walk in shower during the construction      Prior Functioning/Environment Level of Independence: Independent with assistive device(s)        Comments: Mod Ind amb limited community distances (limited by L knee pain).  One fall in the last 6 months when tripped on rug, Ind with ADL        OT Problem List: Decreased strength;Pain;Decreased range of motion;Decreased knowledge of use of DME or AE      OT Treatment/Interventions: Self-care/ADL  training;Therapeutic exercise;Therapeutic activities;DME and/or AE instruction;Patient/family education    OT Goals(Current goals can be found in the care plan section) Acute Rehab OT Goals Patient Stated Goal: go home and return to PLOF with less pain OT Goal Formulation: With patient Time For Goal Achievement: 02/26/19 Potential to Achieve Goals: Good ADL Goals Pt Will Perform Lower Body Dressing: with supervision;sit to/from stand;with adaptive equipment Pt Will Transfer to Toilet: with supervision;ambulating(comfort height toilet, LRAD for amb) Additional ADL Goal #1: Pt will independently instruct family in compression stocking mgt Additional ADL Goal #2: Pt will independently instruct family in polar care mgt  OT Frequency: Min 1X/week   Barriers to D/C:            Co-evaluation              AM-PAC OT "6 Clicks" Daily Activity     Outcome Measure Help from another person eating meals?: None Help from another person taking care of personal grooming?: None Help from another person toileting, which includes using toliet, bedpan, or urinal?: A Little Help from another person bathing (including washing, rinsing, drying)?: A Little Help from another person to put on and taking off regular upper body clothing?: None Help from another person to put on and  taking off regular lower body clothing?: A Little 6 Click Score: 21   End of Session Equipment Utilized During Treatment: Gait belt;Rolling walker  Activity Tolerance: Patient tolerated treatment well Patient left: in chair;with call bell/phone within reach;with chair alarm set;with SCD's reapplied;Other (comment)(hemovac and polar care in place)  OT Visit Diagnosis: Other abnormalities of gait and mobility (R26.89);Pain Pain - Right/Left: Left Pain - part of body: Knee                Time: [id number] OT Time Calculation (min): 27 min Charges:  OT General Charges $OT Visit: 1 Visit OT Evaluation $OT Eval Low  Complexity: 1 Low OT Treatments $Self Care/Home Management : 8-22 mins $Therapeutic Activity: 8-22 mins  Richrd PrimeJamie Stiller, MPH, MS, OTR/L ascom [phone] 02/12/19, 9:09 AM

## 2019-02-12 NOTE — Discharge Summary (Signed)
Physician Discharge Summary  Patient ID: Alice Brackenamela E Hearty MRN: [id number] DOB/AGE: [birth year] 68 y.o.  Admit date: 02/11/2019 Discharge date: 02/13/2019  Admission Diagnoses:  PRIMARY OSTEOARTHRITIS OF LEFT KNEE   Discharge Diagnoses: Patient Active Problem List   Diagnosis Date Noted  . Total knee replacement status 02/11/2019  . Increase in creatinine 12/25/2018  . Bilateral leg edema 07/02/2018  . Bruising 07/02/2018  . BMI 40.0-44.9, adult (HCC) 04/27/2018  . Endometrial thickening on ultrasound 08/31/2017  . Pelvic pain in female 08/31/2017  . Osteoarthritis 08/01/2017  . Candidal intertrigo 04/17/2017  . Primary osteoarthritis of both knees 09/14/2016  . Heart palpitations 04/28/2016  . Right flank pain 04/22/2016  . HTN (hypertension) 02/02/2016  . Hyperlipidemia 02/02/2016  . Hip fracture (HCC) 02/02/2016  . Closed displaced intertrochanteric fracture of right femur with routine healing 02/01/2016  . Mid back pain on right side 08/29/2014  . Abnormal chest CT 05/27/2014  . Dyspareunia 11/25/2013  . Recurrent UTI 10/25/2013    Past Medical History:  Diagnosis Date  . Arthritis   . Bulging lumbar disc   . Chicken pox   . GERD (gastroesophageal reflux disease)   . History of UTI    [facility]  . Hyperlipidemia   . Hypertension   . Psoriasis      Transfusion: none   Consultants (if any): none  Discharged Condition: Improved  Hospital Course: Alice Campbell is an 68 y.o. female who was admitted 02/11/2019 with a diagnosis of degenerative arthrosis to left knee and went to the operating room on 02/11/2019 and underwent the above named procedures.    Surgeries:Procedure(s): COMPUTER ASSISTED TOTAL KNEE ARTHROPLASTY - RNFA on 02/11/2019  PRE-OPERATIVE DIAGNOSIS: Degenerative arthrosis of the left knee, primary  POST-OPERATIVE DIAGNOSIS:  Same  PROCEDURE:  Left total knee arthroplasty using computer-assisted navigation  SURGEON:  Jena GaussJames P Hooten,  Jr. M.D.  ANESTHESIA: spinal  ESTIMATED BLOOD LOSS: 50 mL  FLUIDS REPLACED: 900 mL of crystalloid  TOURNIQUET TIME: 90 minutes  DRAINS: 2 medium Hemovac drains  SOFT TISSUE RELEASES: Anterior cruciate ligament, posterior cruciate ligament, deep and superficial medial collateral ligament, patellofemoral ligament  IMPLANTS UTILIZED: DePuy Attune size 5 posterior stabilized femoral component (cemented), size 5 rotating platform tibial component (cemented), 35 mm medialized dome patella (cemented), and a 10 mm stabilized rotating platform polyethylene insert.  INDICATIONS FOR SURGERY: Alice Brackenamela E Neiss is a 68 y.o. year old female with a long history of progressive knee pain. X-rays demonstrated severe degenerative changes in tricompartmental fashion. The patient had not seen any significant improvement despite conservative nonsurgical intervention. After discussion of the risks and benefits of surgical intervention, the patient expressed understanding of the risks benefits and agree with plans for total knee arthroplasty.   The risks, benefits, and alternatives were discussed at length including but not limited to the risks of infection, bleeding, nerve injury, stiffness, blood clots, the need for revision surgery, cardiopulmonary complications, among others, and they were willing to proceed. Patient tolerated the surgery well. No complications .Patient was taken to PACU where she was stabilized and then transferred to the orthopedic floor.  Patient started on Lovenox 30mg  q 12 hrs. Foot pumps applied bilaterally at 80 m hgb. Heels elevated off bed with rolled towels. No evidence of DVT. Calves non tender. Negative Homan. Physical therapy started on day #1 for gait training and transfer with OT starting on  day #1 for ADL and assisted devices. Patient has done well with therapy. Ambulated 200 feet upon being  discharged. Was able to ascend and descend 4 step safely and independently    Patient's IV And Foley were discontinued on day #1 with Hemovac being discontinued on day #2. Dressing was changed on day 2 prior to patient being discharged   She was given perioperative antibiotics:  Anti-infectives (From admission, onward)   Start     Dose/Rate Route Frequency Ordered Stop   02/11/19 1330  ceFAZolin (ANCEF) IVPB 2g/100 mL premix     2 g 200 mL/hr over 30 Minutes Intravenous Every 6 hours 02/11/19 1305 02/12/19 0845   02/11/19 0624  ceFAZolin (ANCEF) 2-4 GM/100ML-% IVPB    Note to Pharmacy: Mike CrazeHolmes, Stephen   : cabinet override      02/11/19 0624 02/11/19 0732   02/11/19 0600  ceFAZolin (ANCEF) IVPB 2g/100 mL premix     2 g 200 mL/hr over 30 Minutes Intravenous On call to O.R. 02/10/19 2213 02/11/19 [id number]    .  She was fitted with AV 1 compression foot pump devices, instructed on heel pumps, early ambulation, and fitted with TED stockings bilaterally for DVT prophylaxis.  She benefited maximally from the hospital stay and there were no complications.    Recent vital signs:  Vitals:   02/12/19 1100 02/12/19 1125  BP: 108/69 (!) 145/95  Pulse: 87 100  Resp:  18  Temp:  98.2 F (36.8 C)  SpO2:  99%    Recent laboratory studies:  Lab Results  Component Value Date   HGB 14.7 02/05/2019   HGB 14.9 07/02/2018   HGB 14.9 04/22/2016   Lab Results  Component Value Date   WBC 8.0 02/05/2019   PLT 265 02/05/2019   Lab Results  Component Value Date   INR 0.9 02/05/2019   Lab Results  Component Value Date   NA 136 02/05/2019   K 3.6 02/05/2019   CL 100 02/05/2019   CO2 28 02/05/2019   BUN 8 02/05/2019   CREATININE 0.75 02/05/2019   GLUCOSE 96 02/05/2019    Discharge Medications:   Allergies as of 02/12/2019   No Known Allergies     Medication List    TAKE these medications   amLODipine 5 MG tablet Commonly known as: NORVASC TAKE 1 TABLET BY MOUTH EVERY DAY   atorvastatin 20 MG tablet Commonly known as: LIPITOR TAKE 1 TABLET BY MOUTH  EVERY DAY What changed: additional instructions   clobetasol 0.05 % external solution Commonly known as: TEMOVATE Apply 1 application topically 2 (two) times daily as needed (psoriasis).   clobetasol ointment 0.05 % Commonly known as: TEMOVATE Apply 1 application topically 2 (two) times daily as needed (psoriasis).   enoxaparin 40 MG/0.4ML injection Commonly known as: LOVENOX Inject 0.4 mLs (40 mg total) into the skin daily for 14 days. Start taking on: February 14, 2019   gabapentin 300 MG capsule Commonly known as: NEURONTIN TAKE 1 CAPSULE BY MOUTH TWICE A DAY   nystatin cream Commonly known as: MYCOSTATIN APPLY 1 APPLICATION TOPICALLY TWICE A DAY (*MIX WITH TRIAMCINOLONE CREAM... FOR INS. COVERAGE) What changed: See the new instructions.   oxyCODONE 5 MG immediate release tablet Commonly known as: Oxy IR/ROXICODONE Take 1 tablet (5 mg total) by mouth every 4 (four) hours as needed for moderate pain (pain score 4-6).   traMADol 50 MG tablet Commonly known as: ULTRAM TAKE 2 TABLETS (100 MG TOTAL) BY MOUTH EVERY 8 (EIGHT) HOURS AS NEEDED. FOR PAIN What changed: Another medication with the same name was added. Make sure you understand  how and when to take each.   traMADol 50 MG tablet Commonly known as: ULTRAM Take 1-2 tablets (50-100 mg total) by mouth every 6 (six) hours as needed for moderate pain. What changed: You were already taking a medication with the same name, and this prescription was added. Make sure you understand how and when to take each.   traZODone 50 MG tablet Commonly known as: DESYREL TAKE 1 TABLET (50 MG TOTAL) BY MOUTH AT BEDTIME AS NEEDED FOR SLEEP.   triamcinolone cream 0.1 % Commonly known as: KENALOG APPLY 1 APPLICATION TOPICALLY TWICE A DAY (MIX WITH NYSTATIN CREAM) What changed: See the new instructions.   trolamine salicylate 10 % cream Commonly known as: ASPERCREME Apply 1 application topically as needed for muscle pain.             Durable Medical Equipment  (From admission, onward)         Start     Ordered   02/11/19 1305  DME Walker rolling  Once    Question:  Patient needs a walker to treat with the following condition  Answer:  Total knee replacement status   02/11/19 1305   02/11/19 1305  DME Bedside commode  Once    Question:  Patient needs a bedside commode to treat with the following condition  Answer:  Total knee replacement status   02/11/19 1305          Diagnostic Studies: Dg Knee Left Port  Result Date: 02/11/2019 CLINICAL DATA:  Status post left knee replacement. EXAM: PORTABLE LEFT KNEE - 1-2 VIEW COMPARISON:  None. FINDINGS: The femoral and tibial components appear to be well situated. Expected postoperative changes noted in the soft tissues anteriorly. Surgical drains are noted in the pre femoral space. IMPRESSION: Status post left total knee arthroplasty. Electronically Signed   By: Marijo Conception M.D.   On: 02/11/2019 11:42    Disposition:   Discharge Instructions    Increase activity slowly   Complete by: As directed       Follow-up Information    Watt Climes, PA On 02/25/2019.   Specialty: Physician Assistant Why: at 1:15pm Contact information: [place] [address] [phone]        Dereck Leep, MD On 03/26/2019.   Specialty: Orthopedic Surgery Why: at 9:15am Contact information: [place] [address] [phone]            Signed: Watt Climes 02/12/2019, 1:17 PM

## 2019-02-13 NOTE — Progress Notes (Signed)
   Subjective: 2 Days Post-Op Procedure(s) (LRB): COMPUTER ASSISTED TOTAL KNEE ARTHROPLASTY - RNFA (Left) Patient reports pain as 3 on 0-10 scale.   Patient is well, and has had no acute complaints or problems Did very well with therapy yesterday.  Ambulated 100 feet in the morning and 200 and afternoon.  Range of motion 0 to 81 degrees.  Also did steps yesterday Plan is to go Home after hospital stay. no nausea and no vomiting Patient denies any chest pains or shortness of breath. Objective: Vital signs in last 24 hours: Temp:  [98.1 F (36.7 C)-98.6 F (37 C)] 98.1 F (36.7 C) (08/19 0033) Pulse Rate:  [82-102] 83 (08/19 0033) Resp:  [16-18] 16 (08/19 0033) BP: (108-147)/(67-103) 131/67 (08/19 0033) SpO2:  [96 %-100 %] 97 % (08/19 0033) well approximated incision Heels are non tender and elevated off the bed using rolled towels Intake/Output from previous day: 08/18 0701 - 08/19 0700 In: 584.9 [P.O.:480; I.V.:4.9; IV Piggyback:100] Out: 70 [Drains:70] Intake/Output this shift: Total I/O In: -  Out: 70 [Drains:70]  No results for input(s): HGB in the last 72 hours. No results for input(s): WBC, RBC, HCT, PLT in the last 72 hours. No results for input(s): NA, K, CL, CO2, BUN, CREATININE, GLUCOSE, CALCIUM in the last 72 hours. No results for input(s): LABPT, INR in the last 72 hours.  EXAM General - Patient is Alert, Appropriate and Oriented Extremity - Neurologically intact Neurovascular intact Sensation intact distally Intact pulses distally Dorsiflexion/Plantar flexion intact No cellulitis present Compartment soft Dressing - Dressing was saturated as though the Hemovac was not working. Motor Function - intact, moving foot and toes well on exam.    Past Medical History:  Diagnosis Date  . Arthritis   . Bulging lumbar disc   . Chicken pox   . GERD (gastroesophageal reflux disease)   . History of UTI    [facility]  . Hyperlipidemia   . Hypertension   .  Psoriasis     Assessment/Plan: 2 Days Post-Op Procedure(s) (LRB): COMPUTER ASSISTED TOTAL KNEE ARTHROPLASTY - RNFA (Left) Active Problems:   Total knee replacement status  Estimated body mass index is 37.21 kg/m as calculated from the following:   Height as of this encounter: 5' 5.5" (1.664 m).   Weight as of this encounter: 103 kg. Up with therapy Discharge home with home health  Labs: None DVT Prophylaxis - Lovenox, Foot Pumps and TED hose Weight-Bearing as tolerated to left leg Patient may be discharged to home once she has a bowel movement Hemovac was removed this morning.  Did not appear to be very deep.  No resistance.  Ends of the tube appear to be intact. Please wash operative leg, change dressing and apply TED stockings to both legs prior to being discharged Please give the patient 2 extra honeycomb dressings to take home Dressing was removed this morning followed by the application of a new honeycomb dressing  Jon R. [place] 02/13/2019, 6:53 AM

## 2019-02-13 NOTE — Plan of Care (Signed)
  Problem: Education: Goal: Knowledge of General Education information will improve Description: Including pain rating scale, medication(s)/side effects and non-pharmacologic comfort measures Outcome: Progressing   Problem: Clinical Measurements: Goal: Ability to maintain clinical measurements within normal limits will improve Outcome: Progressing Goal: Will remain free from infection Outcome: Progressing Goal: Diagnostic test results will improve Outcome: Progressing Goal: Respiratory complications will improve Outcome: Progressing Goal: Cardiovascular complication will be avoided Outcome: Progressing   Problem: Activity: Goal: Risk for activity intolerance will decrease Outcome: Progressing   Problem: Nutrition: Goal: Adequate nutrition will be maintained Outcome: Progressing   Problem: Coping: Goal: Level of anxiety will decrease Outcome: Progressing   Problem: Elimination: Goal: Will not experience complications related to bowel motility Outcome: Progressing Goal: Will not experience complications related to urinary retention Outcome: Progressing   Problem: Coping: Goal: Level of anxiety will decrease Outcome: Progressing   Problem: Coping: Goal: Level of anxiety will decrease Outcome: Progressing   Problem: Elimination: Goal: Will not experience complications related to bowel motility Outcome: Progressing Goal: Will not experience complications related to urinary retention Outcome: Progressing

## 2019-02-13 NOTE — Progress Notes (Signed)
Physical Therapy Treatment Patient Details Name: Alice Campbell MRN: [id number] DOB: [birth year] Today's Date: 02/13/2019    History of Present Illness Pt is a 68 yo female diagnosed with degenerative arthrosis of the left knee and is s/p elective L TKA.  PMH includes HTN, arthritis, GERD, HLD, and bulging lumbar disc.    PT Comments    Pt presents with deficits in strength, transfers, L knee ROM, gait, balance, and activity tolerance but overall is progressing very well towards goals.  Pt was Ind with bed mobility tasks and SBA with transfers with good control and stability.  Pt was able to amb 200' with step-through pattern with very light lean on the RW.  Pt was steady ascending and descending 4 steps with B rails with good carryover with proper sequencing.  Pt will benefit from HHPT services upon discharge to safely address above deficits for decreased caregiver assistance and eventual return to PLOF.     Follow Up Recommendations  Home health PT;Supervision for mobility/OOB     Equipment Recommendations  None recommended by PT    Recommendations for Other Services       Precautions / Restrictions Precautions Precautions: Fall Restrictions Weight Bearing Restrictions: Yes LLE Weight Bearing: Weight bearing as tolerated Other Position/Activity Restrictions: Pt able to perform Ind LLE SLR without extensor lag, no KI required    Mobility  Bed Mobility Overal bed mobility: Independent             General bed mobility comments: Improved speed and effort with bed mobility tasks without the need of the bed rail  Transfers Overall transfer level: Needs assistance Equipment used: Rolling walker (2 wheeled) Transfers: Sit to/from Stand Sit to Stand: Supervision         General transfer comment: Good eccentric and concentric control with transfers with min verbal cues for hand placement  Ambulation/Gait Ambulation/Gait assistance: Supervision;Min guard Gait Distance  (Feet): 200 Feet Assistive device: Rolling walker (2 wheeled) Gait Pattern/deviations: Step-through pattern;Decreased step length - right;Decreased step length - left;Antalgic Gait velocity: decreased   General Gait Details: Good stability and improved cadence with min verbal cues for amb closer to the RW with upright posture   Stairs   Stairs assistance: Min guard Stair Management: Two rails;Step to pattern Number of Stairs: 4 General stair comments: Good eccentric and concentric control with good carryover with sequencing   Wheelchair Mobility    Modified Rankin (Stroke Patients Only)       Balance Overall balance assessment: Needs assistance Sitting-balance support: Feet supported;No upper extremity supported Sitting balance-Leahy Scale: Normal     Standing balance support: Bilateral upper extremity supported;During functional activity Standing balance-Leahy Scale: Good                              Cognition   Behavior During Therapy: WFL for tasks assessed/performed Overall Cognitive Status: Within Functional Limits for tasks assessed                                        Exercises Total Joint Exercises Ankle Circles/Pumps: Strengthening;AROM;Both;10 reps;15 reps Quad Sets: AROM;Strengthening;Both;10 reps;15 reps Gluteal Sets: Strengthening;Both;10 reps;15 reps Heel Slides: AROM;Left;10 reps;5 reps Hip ABduction/ADduction: AROM;Left;10 reps Straight Leg Raises: AROM;10 reps;Left Long Arc Quad: AROM;Strengthening;10 reps;15 reps;Left Knee Flexion: AROM;Strengthening;Left;10 reps;15 reps Goniometric ROM: L knee AROM: 0-95 deg Marching in Standing:  AROM;Both;10 reps;Standing Other Exercises Other Exercises: 90 deg left turn training to prevent CKC twisting on the L knee Other Exercises: HEP education/review per handout    General Comments        Pertinent Vitals/Pain Pain Assessment: 0-10 Pain Score: 2  Pain Location: L  knee Pain Descriptors / Indicators: Sore Pain Intervention(s): Premedicated before session;Monitored during session    Home Living                      Prior Function            PT Goals (current goals can now be found in the care plan section) Progress towards PT goals: Progressing toward goals    Frequency    BID      PT Plan Current plan remains appropriate    Co-evaluation              AM-PAC PT "6 Clicks" Mobility   Outcome Measure  Help needed turning from your back to your side while in a flat bed without using bedrails?: None Help needed moving from lying on your back to sitting on the side of a flat bed without using bedrails?: None Help needed moving to and from a bed to a chair (including a wheelchair)?: A Little Help needed standing up from a chair using your arms (e.g., wheelchair or bedside chair)?: A Little Help needed to walk in hospital room?: A Little Help needed climbing 3-5 steps with a railing? : A Little 6 Click Score: 20    End of Session Equipment Utilized During Treatment: Gait belt Activity Tolerance: Patient tolerated treatment well Patient left: in chair;with call bell/phone within reach;with chair alarm set;Other (comment)(Polar care to L knee; Pt declined SCDs secondary to preparing for discharge) Nurse Communication: Mobility status PT Visit Diagnosis: Other abnormalities of gait and mobility (R26.89);Muscle weakness (generalized) (M62.81)     Time: [id number] PT Time Calculation (min) (ACUTE ONLY): 26 min  Charges:  $Gait Training: 8-22 mins $Therapeutic Exercise: 8-22 mins                     D. Scott Barnell Shieh PT, DPT 02/13/19, 1:00 PM

## 2019-02-13 NOTE — TOC Transition Note (Signed)
Transition of Care [facility]) - CM/SW Discharge Note   Patient Details  Name: Alice Campbell MRN: [id number] Date of Birth: 10/15/1950  [facility] CM/SW Contact:  Su Hilt, RN Phone Number: 02/13/2019, 8:32 AM   Clinical Narrative:     Patient discharging home with [place], [place] aware of the DC Patient has DME at home and has no additional Needs Lovenox is 90$ and patient is able to afford her medication  Final next level of care: Home w Home Health Services Barriers to Discharge: Barriers Resolved   Patient Goals and CMS Choice Patient states their goals for this hospitalization and ongoing recovery are:: go home CMS Medicare.gov Compare Post Acute Care list provided to:: Patient Choice offered to / list presented to : Patient  Discharge Placement                       Discharge Plan and Services   Discharge Planning Services: CM Consult Post Acute Care Choice: Home Health          DME Arranged: N/A         HH Arranged: PT HH Agency: [facility] Date [place]: 02/12/19 Time [place]: [id number] Representative spoke with at [place]: [place] ([place]) Interventions     Readmission Risk Interventions No flowsheet data found.

## 2019-02-14 DIAGNOSIS — Z87440 Personal history of urinary (tract) infections: Secondary | ICD-10-CM | POA: Diagnosis not present

## 2019-02-14 DIAGNOSIS — Z7901 Long term (current) use of anticoagulants: Secondary | ICD-10-CM | POA: Diagnosis not present

## 2019-02-14 DIAGNOSIS — K219 Gastro-esophageal reflux disease without esophagitis: Secondary | ICD-10-CM | POA: Diagnosis not present

## 2019-02-14 DIAGNOSIS — M5126 Other intervertebral disc displacement, lumbar region: Secondary | ICD-10-CM | POA: Diagnosis not present

## 2019-02-14 DIAGNOSIS — E785 Hyperlipidemia, unspecified: Secondary | ICD-10-CM | POA: Diagnosis not present

## 2019-02-14 DIAGNOSIS — Z471 Aftercare following joint replacement surgery: Secondary | ICD-10-CM | POA: Diagnosis not present

## 2019-02-14 DIAGNOSIS — M1711 Unilateral primary osteoarthritis, right knee: Secondary | ICD-10-CM | POA: Diagnosis not present

## 2019-02-14 DIAGNOSIS — Z87891 Personal history of nicotine dependence: Secondary | ICD-10-CM | POA: Diagnosis not present

## 2019-02-14 DIAGNOSIS — L409 Psoriasis, unspecified: Secondary | ICD-10-CM | POA: Diagnosis not present

## 2019-02-14 DIAGNOSIS — Z79891 Long term (current) use of opiate analgesic: Secondary | ICD-10-CM | POA: Diagnosis not present

## 2019-02-14 DIAGNOSIS — I10 Essential (primary) hypertension: Secondary | ICD-10-CM | POA: Diagnosis not present

## 2019-02-14 DIAGNOSIS — Z96652 Presence of left artificial knee joint: Secondary | ICD-10-CM | POA: Diagnosis not present

## 2019-02-15 ENCOUNTER — Ambulatory Visit: Payer: PPO | Admitting: Family Medicine

## 2019-02-23 DIAGNOSIS — Z471 Aftercare following joint replacement surgery: Secondary | ICD-10-CM | POA: Diagnosis not present

## 2019-02-26 DIAGNOSIS — Z96652 Presence of left artificial knee joint: Secondary | ICD-10-CM | POA: Diagnosis not present

## 2019-02-26 DIAGNOSIS — M25562 Pain in left knee: Secondary | ICD-10-CM | POA: Diagnosis not present

## 2019-02-26 DIAGNOSIS — M6281 Muscle weakness (generalized): Secondary | ICD-10-CM | POA: Diagnosis not present

## 2019-02-26 DIAGNOSIS — M25662 Stiffness of left knee, not elsewhere classified: Secondary | ICD-10-CM | POA: Diagnosis not present

## 2019-02-28 ENCOUNTER — Other Ambulatory Visit: Payer: Self-pay | Admitting: Family Medicine

## 2019-02-28 DIAGNOSIS — Z96652 Presence of left artificial knee joint: Secondary | ICD-10-CM | POA: Diagnosis not present

## 2019-03-01 NOTE — Telephone Encounter (Signed)
Last OV 12/25/18 last fill 02/05/19

## 2019-03-07 DIAGNOSIS — Z96652 Presence of left artificial knee joint: Secondary | ICD-10-CM | POA: Diagnosis not present

## 2019-03-07 DIAGNOSIS — M25562 Pain in left knee: Secondary | ICD-10-CM | POA: Diagnosis not present

## 2019-03-11 ENCOUNTER — Other Ambulatory Visit: Payer: Self-pay | Admitting: Family Medicine

## 2019-03-12 DIAGNOSIS — M25562 Pain in left knee: Secondary | ICD-10-CM | POA: Diagnosis not present

## 2019-03-12 DIAGNOSIS — Z96652 Presence of left artificial knee joint: Secondary | ICD-10-CM | POA: Diagnosis not present

## 2019-03-14 DIAGNOSIS — Z96652 Presence of left artificial knee joint: Secondary | ICD-10-CM | POA: Diagnosis not present

## 2019-03-19 DIAGNOSIS — Z96652 Presence of left artificial knee joint: Secondary | ICD-10-CM | POA: Diagnosis not present

## 2019-03-19 DIAGNOSIS — M25562 Pain in left knee: Secondary | ICD-10-CM | POA: Diagnosis not present

## 2019-03-21 DIAGNOSIS — M25562 Pain in left knee: Secondary | ICD-10-CM | POA: Diagnosis not present

## 2019-03-21 DIAGNOSIS — Z96652 Presence of left artificial knee joint: Secondary | ICD-10-CM | POA: Diagnosis not present

## 2019-03-26 DIAGNOSIS — M1712 Unilateral primary osteoarthritis, left knee: Secondary | ICD-10-CM | POA: Diagnosis not present

## 2019-03-26 DIAGNOSIS — Z96652 Presence of left artificial knee joint: Secondary | ICD-10-CM | POA: Diagnosis not present

## 2019-04-03 ENCOUNTER — Other Ambulatory Visit: Payer: Self-pay | Admitting: Family Medicine

## 2019-04-04 NOTE — Telephone Encounter (Signed)
Sent to pharmacy. Patient needs follow-up scheduled for about 2 months from now. Please contact her to get her scheduled. Thanks.

## 2019-04-04 NOTE — Telephone Encounter (Signed)
I called patient and scheduled a follow up appt in December.  Burnette Sautter,cma

## 2019-05-09 ENCOUNTER — Other Ambulatory Visit: Payer: Self-pay | Admitting: Family Medicine

## 2019-06-11 ENCOUNTER — Ambulatory Visit (INDEPENDENT_AMBULATORY_CARE_PROVIDER_SITE_OTHER): Payer: PPO | Admitting: Family Medicine

## 2019-06-11 ENCOUNTER — Other Ambulatory Visit: Payer: Self-pay

## 2019-06-11 ENCOUNTER — Encounter: Payer: Self-pay | Admitting: Family Medicine

## 2019-06-11 DIAGNOSIS — I10 Essential (primary) hypertension: Secondary | ICD-10-CM

## 2019-06-11 DIAGNOSIS — Z96652 Presence of left artificial knee joint: Secondary | ICD-10-CM | POA: Diagnosis not present

## 2019-06-11 DIAGNOSIS — G479 Sleep disorder, unspecified: Secondary | ICD-10-CM | POA: Diagnosis not present

## 2019-06-11 DIAGNOSIS — R9389 Abnormal findings on diagnostic imaging of other specified body structures: Secondary | ICD-10-CM | POA: Diagnosis not present

## 2019-06-11 NOTE — Assessment & Plan Note (Signed)
Well controlled. Continue amlodipine 

## 2019-06-11 NOTE — Progress Notes (Signed)
Virtual Visit via telephone Note  This visit type was conducted due to national recommendations for restrictions regarding the COVID-19 pandemic (e.g. social distancing).  This format is felt to be most appropriate for this patient at this time.  All issues noted in this document were discussed and addressed.  No physical exam was performed (except for noted visual exam findings with Video Visits).   I connected with Alice Campbell today at 11:00 AM EST by telephone and verified that I am speaking with the correct person using two identifiers. Location patient: home Location provider: work Persons participating in the virtual visit: patient, provider  I discussed the limitations, risks, security and privacy concerns of performing an evaluation and management service by telephone and the availability of in person appointments. I also discussed with the patient that there may be a patient responsible charge related to this service. The patient expressed understanding and agreed to proceed.  Interactive audio and video telecommunications were attempted between this provider and patient, however failed, due to patient having technical difficulties OR patient did not have access to video capability.  We continued and completed visit with audio only.   Reason for visit: follow-up  HPI: Hypertension: Has been 118/84.  Taking amlodipine.  No chest pain, shortness of breath, or edema.  Sleep difficulty: She notes no issues sleeping currently.  She is no longer on trazodone.  The gabapentin that she takes for her back helps her sleep.  Gets 6 to 8 hours of sleep nightly.  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  She notes her swelling is progressively improving.  She is walking with a cane.  She has followed up with orthopedics and they recommended 1 year follow-up.  She completed physical therapy.  She rarely takes tramadol for discomfort.  Does get drowsy  with the tramadol though does not drive when she takes it.  History of endometrial thickening on ultrasound: Patient had evaluation that appears to have been benign through GYN.  They did recommend having her follow-up in 1 year which would have been earlier this year for repeat ultrasound.  She notes no vaginal bleeding.   ROS: See pertinent positives and negatives per HPI.  Past Medical History:  Diagnosis Date  . Arthritis   . Bulging lumbar disc   . Chicken pox   . GERD (gastroesophageal reflux disease)   . History of UTI    [facility]  . Hyperlipidemia   . Hypertension   . Psoriasis     Past Surgical History:  Procedure Laterality Date  . BIOPSY THYROID  07/2013  . CHOLECYSTECTOMY  2011  . FRACTURE SURGERY Right    Hip  . KNEE ARTHROPLASTY Left 02/11/2019   Procedure: COMPUTER ASSISTED TOTAL KNEE ARTHROPLASTY - RNFA;  Surgeon: Donato Heinz, MD;  Location: [facility] ORS;  Service: Orthopedics;  Laterality: Left;    Family History  Problem Relation Age of Onset  . Heart disease Mother   . Hypertension Mother   . Mental illness Mother        Nervous breakdown   . Cancer Father        Prostate Cancer  . Heart disease Father   . Hypertension Father   . Diabetes Son     SOCIAL HX: Former smoker   Current Outpatient Medications:  .  amLODipine (NORVASC) 5 MG tablet, TAKE 1 TABLET BY MOUTH EVERY DAY, Disp: 90 tablet, Rfl: 1 .  atorvastatin (LIPITOR) 20  MG tablet, TAKE 1 TABLET BY MOUTH EVERY DAY (Patient taking differently: Take 20 mg by mouth daily. TAKE 1 TABLET BY MOUTH EVERY DAY), Disp: 90 tablet, Rfl: 2 .  clobetasol (TEMOVATE) 0.05 % external solution, Apply 1 application topically 2 (two) times daily as needed (psoriasis)., Disp: , Rfl:  .  clobetasol ointment (TEMOVATE) 5.85 %, Apply 1 application topically 2 (two) times daily as needed (psoriasis)., Disp: , Rfl:  .  gabapentin (NEURONTIN) 300 MG capsule, TAKE 1 CAPSULE BY MOUTH TWICE A DAY, Disp: 180  capsule, Rfl: 0 .  nystatin cream (MYCOSTATIN), APPLY 1 APPLICATION TOPICALLY TWICE A DAY (*MIX WITH TRIAMCINOLONE CREAM... FOR INS. COVERAGE) (Patient taking differently: Apply 1 application topically 2 (two) times daily. Mix with Triamcinolone Cream), Disp: 30 g, Rfl: 0 .  traMADol (ULTRAM) 50 MG tablet, TAKE 2 TABLETS BY MOUTH EVERY 8 (EIGHT) HOURS AS NEEDED FOR PAIN, Disp: 60 tablet, Rfl: 0 .  triamcinolone cream (KENALOG) 0.1 %, APPLY 1 APPLICATION TOPICALLY TWICE A DAY (MIX WITH NYSTATIN CREAM) (Patient taking differently: Apply 1 application topically 2 (two) times daily. Mix with Nystatin Cream), Disp: 30 g, Rfl: 0 .  trolamine salicylate (ASPERCREME) 10 % cream, Apply 1 application topically as needed for muscle pain., Disp: , Rfl:   EXAM: This was a telehealth telephone visit and thus no physical exam was completed.  ASSESSMENT AND PLAN:  Discussed the following assessment and plan:  HTN (hypertension) Well-controlled.  Continue amlodipine.  Endometrial thickening on ultrasound History of this in the past.  Will refer back to GYN to evaluate further.  Total knee replacement status Status post knee replacement.  She is doing well.  She will continue tramadol as needed for pain.  Discussed not driving if she is drowsy with this.  Sleeping difficulty Not currently an issue.  She will monitor.    I discussed the assessment and treatment plan with the patient. The patient was provided an opportunity to ask questions and all were answered. The patient agreed with the plan and demonstrated an understanding of the instructions.   The patient was advised to call back or seek an in-person evaluation if the symptoms worsen or if the condition fails to improve as anticipated.  I provided 12 minutes of non-face-to-face time during this encounter.   Tommi Rumps, MD

## 2019-06-11 NOTE — Assessment & Plan Note (Signed)
History of this in the past.  Will refer back to GYN to evaluate further.

## 2019-06-11 NOTE — Assessment & Plan Note (Signed)
Not currently an issue.  She will monitor.

## 2019-06-11 NOTE — Assessment & Plan Note (Signed)
Status post knee replacement.  She is doing well.  She will continue tramadol as needed for pain.  Discussed not driving if she is drowsy with this.

## 2019-06-13 ENCOUNTER — Other Ambulatory Visit: Payer: Self-pay | Admitting: Obstetrics & Gynecology

## 2019-06-13 ENCOUNTER — Telehealth: Payer: Self-pay | Admitting: Obstetrics & Gynecology

## 2019-06-13 DIAGNOSIS — R9389 Abnormal findings on diagnostic imaging of other specified body structures: Secondary | ICD-10-CM

## 2019-06-13 NOTE — Telephone Encounter (Signed)
[facility] referring for Endometrial thickening on ultrasound. Patient is schedule 09/02/18 for ultrasound and follow up with [facility]. Please place ultrasound order request.

## 2019-06-25 ENCOUNTER — Other Ambulatory Visit: Payer: Self-pay | Admitting: Student

## 2019-06-25 DIAGNOSIS — M5416 Radiculopathy, lumbar region: Secondary | ICD-10-CM

## 2019-07-02 ENCOUNTER — Other Ambulatory Visit: Payer: Self-pay | Admitting: Family Medicine

## 2019-07-03 ENCOUNTER — Ambulatory Visit
Admission: RE | Admit: 2019-07-03 | Discharge: 2019-07-03 | Disposition: A | Discharge status: Home / Self Care | Payer: PPO | Source: Ambulatory Visit | Attending: Student | Admitting: Student

## 2019-07-03 ENCOUNTER — Other Ambulatory Visit: Payer: Self-pay

## 2019-07-03 DIAGNOSIS — M5416 Radiculopathy, lumbar region: Secondary | ICD-10-CM | POA: Diagnosis not present

## 2019-07-03 MED ORDER — IOPAMIDOL (ISOVUE-M 200) INJECTION 41%
1.0000 mL | Freq: Once | INTRAMUSCULAR | Status: AC
  Administered 2019-07-03: 12:00:00 1 mL via EPIDURAL

## 2019-07-03 MED ORDER — METHYLPREDNISOLONE ACETATE 40 MG/ML INJ SUSP (RADIOLOG
120.0000 mg | Freq: Once | INTRAMUSCULAR | Status: AC
  Administered 2019-07-03: 120 mg via EPIDURAL

## 2019-07-03 NOTE — Discharge Instructions (Signed)

## 2019-07-23 ENCOUNTER — Other Ambulatory Visit: Payer: Self-pay

## 2019-07-23 ENCOUNTER — Ambulatory Visit (INDEPENDENT_AMBULATORY_CARE_PROVIDER_SITE_OTHER): Payer: PPO

## 2019-07-23 VITALS — Ht 65.0 in | Wt 223.0 lb

## 2019-07-23 DIAGNOSIS — Z0000 Encounter for general adult medical examination without abnormal findings: Secondary | ICD-10-CM

## 2019-07-23 NOTE — Progress Notes (Addendum)
Subjective:   Alice Campbell is a 69 y.o. female who presents for Medicare Annual (Subsequent) preventive examination.  Review of Systems:  No ROS.  Medicare Wellness Virtual Visit.  Visual/audio telehealth visit, UTA vital signs.   Ht/Wt provided.  See social history for additional risk factors.   Cardiac Risk Factors include: advanced age (>26men, >43 women);hypertension     Objective:     Vitals: Ht 5\' 5"  (1.651 m)   Wt 223 lb (101.2 kg)   BMI 37.11 kg/m   Body mass index is 37.11 kg/m.  Advanced Directives 07/23/2019 02/11/2019 02/05/2019 07/18/2018 05/24/2016 01/19/2016 01/12/2016  Does Patient Have a Medical Advance Directive? No No No No No No No  Would patient like information on creating a medical advance directive? No - Patient declined No - Patient declined No - Patient declined No - Patient declined (No Data) Yes - 01/14/2016 given (No Data)    Tobacco Social History   Tobacco Use  Smoking Status Former Smoker  . Quit date: 01/19/1996  . Years since quitting: 23.5  Smokeless Tobacco Never Used  Tobacco Comment   quit in 2000     Counseling given: Not Answered Comment: quit in 2000   Clinical Intake:  Pre-visit preparation completed: Yes           How often do you need to have someone help you when you read instructions, pamphlets, or other written materials from your doctor or pharmacy?: 1 - Never  Interpreter Needed?: No     Past Medical History:  Diagnosis Date  . Arthritis   . Bulging lumbar disc   . Chicken pox   . GERD (gastroesophageal reflux disease)   . History of UTI    [facility]  . Hyperlipidemia   . Hypertension   . Psoriasis    Past Surgical History:  Procedure Laterality Date  . BIOPSY THYROID  07/2013  . CHOLECYSTECTOMY  2011  . FRACTURE SURGERY Right    Hip  . KNEE ARTHROPLASTY Left 02/11/2019   Procedure: COMPUTER ASSISTED TOTAL KNEE ARTHROPLASTY - RNFA;  Surgeon: 02/13/2019, MD;  Location:  [facility] ORS;  Service: Orthopedics;  Laterality: Left;   Family History  Problem Relation Age of Onset  . Heart disease Mother   . Hypertension Mother   . Mental illness Mother        Nervous breakdown   . Cancer Father        Prostate Cancer  . Heart disease Father   . Hypertension Father   . Diabetes Son    Social History   Socioeconomic History  . Marital status: Married    Spouse name: Not on file  . Number of children: Not on file  . Years of education: 52  . Highest education level: Not on file  Occupational History  . Occupation: 14 Work    Comment: Semi-retired  Tobacco Use  . Smoking status: Former Smoker    Quit date: 01/19/1996    Years since quitting: 23.5  . Smokeless tobacco: Never Used  . Tobacco comment: quit in 2000  Substance and Sexual Activity  . Alcohol use: Yes    Comment: 1-2 drinks per month  . Drug use: No  . Sexual activity: Yes  Other Topics Concern  . Not on file  Social History Narrative   Alice Campbell grew up in [place], [place]. She is currently living in [place] with her husband. She enjoys swimming and reading.   Social Determinants of  Health   Financial Resource Strain:   . Difficulty of Paying Living Expenses: Not on file  Food Insecurity:   . Worried About Programme researcher, broadcasting/film/video in the Last Year: Not on file  . Ran Out of Food in the Last Year: Not on file  Transportation Needs:   . Lack of Transportation (Medical): Not on file  . Lack of Transportation (Non-Medical): Not on file  Physical Activity:   . Days of Exercise per Week: Not on file  . Minutes of Exercise per Session: Not on file  Stress:   . Feeling of Stress : Not on file  Social Connections:   . Frequency of Communication with Friends and Family: Not on file  . Frequency of Social Gatherings with Friends and Family: Not on file  . Attends Religious Services: Not on file  . Active Member of Clubs or Organizations: Not on file  . Attends Banker  Meetings: Not on file  . Marital Status: Not on file    Outpatient Encounter Medications as of 07/23/2019  Medication Sig  . amLODipine (NORVASC) 5 MG tablet TAKE 1 TABLET BY MOUTH EVERY DAY  . atorvastatin (LIPITOR) 20 MG tablet TAKE 1 TABLET BY MOUTH EVERY DAY (Patient taking differently: Take 20 mg by mouth daily. TAKE 1 TABLET BY MOUTH EVERY DAY)  . clobetasol (TEMOVATE) 0.05 % external solution Apply 1 application topically 2 (two) times daily as needed (psoriasis).  . clobetasol ointment (TEMOVATE) 0.05 % Apply 1 application topically 2 (two) times daily as needed (psoriasis).  . gabapentin (NEURONTIN) 300 MG capsule TAKE 1 CAPSULE BY MOUTH TWICE A DAY  . nystatin cream (MYCOSTATIN) APPLY 1 APPLICATION TOPICALLY TWICE A DAY (*MIX WITH TRIAMCINOLONE CREAM... FOR INS. COVERAGE) (Patient taking differently: Apply 1 application topically 2 (two) times daily. Mix with Triamcinolone Cream)  . traMADol (ULTRAM) 50 MG tablet TAKE 2 TABLETS BY MOUTH EVERY 8 (EIGHT) HOURS AS NEEDED FOR PAIN  . triamcinolone cream (KENALOG) 0.1 % APPLY 1 APPLICATION TOPICALLY TWICE A DAY (MIX WITH NYSTATIN CREAM) (Patient taking differently: Apply 1 application topically 2 (two) times daily. Mix with Nystatin Cream)  . trolamine salicylate (ASPERCREME) 10 % cream Apply 1 application topically as needed for muscle pain.   No facility-administered encounter medications on file as of 07/23/2019.    Activities of Daily Living In your present state of health, do you have any difficulty performing the following activities: 07/23/2019 02/11/2019  Hearing? N N  Vision? N N  Difficulty concentrating or making decisions? N N  Walking or climbing stairs? Y Y  Comment Unsteady gait; walker, cane in use -  Dressing or bathing? N N  Doing errands, shopping? N N  Preparing Food and eating ? Y -  Comment Husband assists. Self feeds. -  Using the Toilet? N -  In the past six months, have you accidently leaked urine? N -  Do  you have problems with loss of bowel control? N -  Managing your Medications? N -  Managing your Finances? N -  Housekeeping or managing your Housekeeping? Y -  Comment Husband assists -  Some recent data might be hidden    Patient Care Team: Glori Luis, MD as PCP - General (Family Medicine)    Assessment:   This is a routine wellness examination for Rielynn.  Nurse connected with patient 07/23/19 at 12:00 PM EST by a telephone enabled telemedicine application and verified that I am speaking with the correct person using  two identifiers. Patient stated full name and DOB. Patient gave permission to continue with virtual visit. Patient's location was at home and Nurse's location was at [place] office.   Patient is alert and oriented x3. Patient denies difficulty focusing or concentrating. Patient likes to read and assists her grandchildren with homework while in virtual school for brain stimulation.   Health Maintenance Due: -Dexa Scan- plans to schedule later in the season -Mammogram- plans to schedule later in the season See completed HM at the end of note.   Eye: Visual acuity not assessed. Virtual visit. Followed by their ophthalmologist.  Dental: UTD  Hearing: Demonstrates normal hearing during visit.  Safety:  Patient feels safe at home- yes Patient does have smoke detectors at home- yes Patient does wear sunscreen or protective clothing when in direct sunlight - yes Patient does wear seat belt when in a moving vehicle - yes Patient drives- yes Adequate lighting in walkways free from debris- yes Grab bars and handrails used as appropriate- yes Ambulates with an assistive device- yes; walker/cane  Cell phone on person when ambulating outside of the home- yes  Social: Alcohol intake - yes   Smoking history- former   Smokers in home? none Illicit drug use? none  Medication: Taking as directed and without issues.  Pill box in use -yes  Self managed - yes     Covid-19: Precautions and sickness symptoms discussed. Wears mask, social distancing, hand hygiene as appropriate.   Activities of Daily Living Patient denies needing assistance with: household chores, feeding themselves, getting from bed to chair, getting to the toilet, bathing/showering, dressing, managing money, or preparing meals.   Discussed the importance of a healthy diet, water intake and the benefits of aerobic exercise. Physical activity- stationary bike twice daily, 15-20 minutes  Diet:  Regular Water: fair intake Caffeine: 1 coke per day  Other Providers Patient Care Team: Glori Luis, MD as PCP - General (Family Medicine)  Exercise Activities and Dietary recommendations Current Exercise Habits: Home exercise routine, Intensity: Mild  Goals    . Healthy Lifestyle     Stay active Low carb diet Stay hydrated       Fall Risk Fall Risk  07/23/2019 06/11/2019 07/18/2018 04/17/2017 01/04/2016  Falls in the past year? 0 0 0 No Yes  Number falls in past yr: - 0 1 - 1  Comment - - Depth perception - -  Injury with Fall? - - - - Yes  Risk Factor Category  - - - - High Fall Risk  Risk for fall due to : - - - - History of fall(s)  Follow up Falls evaluation completed Falls evaluation completed - - Falls prevention discussed   Timed Get Up and Go performed: no, virtual visit  Depression Screen PHQ 2/9 Scores 07/23/2019 06/11/2019 07/18/2018 04/17/2017  PHQ - 2 Score 0 0 0 0     Cognitive Function MMSE - Mini Mental State Exam 07/18/2018  Orientation to time 5  Orientation to Place 5  Registration 3  Attention/ Calculation 5  Recall 3  Language- name 2 objects 2  Language- repeat 1  Language- follow 3 step command 3  Language- read & follow direction 1  Write a sentence 1  Copy design 1  Total score 30     6CIT Screen 07/23/2019  What Year? 0 points  What month? 0 points  What time? 0 points  Count back from 20 0 points  Months in reverse 0 points   Repeat  phrase 0 points  Total Score 0    Immunization History  Administered Date(s) Administered  . Fluad Quad(high Dose 65+) 03/20/2019  . Influenza, High Dose Seasonal PF 04/17/2017, 04/15/2018  . Influenza,inj,Quad PF,6+ Mos 04/24/2017  . Pneumococcal Polysaccharide-23 07/03/2018  . Td 12/09/2013  . Zoster 12/24/2013   Screening Tests Health Maintenance  Topic Date Due  . PNA vac Low Risk Adult (2 of 2 - PCV13) 07/04/2019  . MAMMOGRAM  07/22/2020 (Originally 03/12/2016)  . DEXA SCAN  07/22/2020 (Originally 01/20/2015)  . TETANUS/TDAP  12/10/2023  . COLONOSCOPY  03/21/2024  . INFLUENZA VACCINE  Completed  . Hepatitis C Screening  Completed      Plan:   Keep all routine maintenance appointments.   Follow up 09/10/19 with your doctor.   Medicare Attestation I have personally reviewed: The patient's medical and social history Their use of alcohol, tobacco or illicit drugs Their current medications and supplements The patient's functional ability including ADLs,fall risks, home safety risks, cognitive, and hearing and visual impairment Diet and physical activities Evidence for depression   I have reviewed and discussed with patient certain preventive protocols, quality metrics, and best practice recommendations.    Varney Biles, LPN  2/33/0076   Reviewed above information.  Agree with assessment and plan.    Dr Nicki Reaper

## 2019-07-23 NOTE — Patient Instructions (Addendum)
  Alice Campbell , Thank you for taking time to come for your Medicare Wellness Visit. I appreciate your ongoing commitment to your health goals. Please review the following plan we discussed and let me know if I can assist you in the future.   These are the goals we discussed: Goals    . Healthy Lifestyle     Stay active Low carb diet Stay hydrated       This is a list of the screening recommended for you and due dates:  Health Maintenance  Topic Date Due  . Pneumonia vaccines (2 of 2 - PCV13) 07/04/2019  . Mammogram  07/22/2020*  . DEXA scan (bone density measurement)  07/22/2020*  . Tetanus Vaccine  12/10/2023  . Colon Cancer Screening  03/21/2024  . Flu Shot  Completed  .  Hepatitis C: One time screening is recommended by Center for Disease Control  (CDC) for  adults born from [birth year] through 1966.   Completed  *Topic was postponed. The date shown is not the original due date.

## 2019-08-20 ENCOUNTER — Other Ambulatory Visit: Payer: Self-pay | Admitting: Family Medicine

## 2019-08-24 ENCOUNTER — Other Ambulatory Visit: Payer: Self-pay | Admitting: Family Medicine

## 2019-09-02 ENCOUNTER — Other Ambulatory Visit: Payer: PPO

## 2019-09-02 ENCOUNTER — Ambulatory Visit: Payer: PPO | Admitting: Obstetrics & Gynecology

## 2019-09-10 ENCOUNTER — Telehealth: Payer: PPO | Admitting: Family Medicine

## 2019-09-11 ENCOUNTER — Encounter: Payer: Self-pay | Admitting: Obstetrics & Gynecology

## 2019-09-11 ENCOUNTER — Ambulatory Visit (INDEPENDENT_AMBULATORY_CARE_PROVIDER_SITE_OTHER): Payer: PPO | Admitting: Obstetrics & Gynecology

## 2019-09-11 ENCOUNTER — Other Ambulatory Visit: Payer: Self-pay | Admitting: Obstetrics & Gynecology

## 2019-09-11 ENCOUNTER — Ambulatory Visit (INDEPENDENT_AMBULATORY_CARE_PROVIDER_SITE_OTHER): Payer: PPO

## 2019-09-11 ENCOUNTER — Other Ambulatory Visit: Payer: Self-pay

## 2019-09-11 VITALS — BP 120/80 | Ht 66.0 in | Wt 220.0 lb

## 2019-09-11 DIAGNOSIS — N840 Polyp of corpus uteri: Secondary | ICD-10-CM

## 2019-09-11 DIAGNOSIS — N852 Hypertrophy of uterus: Secondary | ICD-10-CM | POA: Diagnosis not present

## 2019-09-11 DIAGNOSIS — R9389 Abnormal findings on diagnostic imaging of other specified body structures: Secondary | ICD-10-CM

## 2019-09-11 NOTE — Progress Notes (Signed)
  Consultant: Dr Birdie Sons Reason: Endometrial thickening/ polyp  HPI: Pt is a 69 yo WF who has had endometrial thickening 2 years ago, followed w [place] (4 mm).  She had EMB 2019 normal w suggestion of benign polyp.  She report no recent bleeding or pain.  Ultrasound demonstrates no masses seen, ES 4.4 mm  PMHx: She  has a past medical history of Arthritis, Bulging lumbar disc, Chicken pox, GERD (gastroesophageal reflux disease), History of UTI, Hyperlipidemia, Hypertension, and Psoriasis. Also,  has a past surgical history that includes Cholecystectomy (2011); Biopsy thyroid (07/2013); Fracture surgery (Right); and Knee Arthroplasty (Left, 02/11/2019)., family history includes Cancer in her father; Diabetes in her son; Heart disease in her father and mother; Hypertension in her father and mother; Mental illness in her mother.,  reports that she quit smoking about 23 years ago. She has never used smokeless tobacco. She reports current alcohol use. She reports that she does not use drugs.  She has a current medication list which includes the following prescription(s): amlodipine, atorvastatin, clobetasol, clobetasol ointment, gabapentin, nystatin cream, tramadol, triamcinolone cream, and trolamine salicylate. Also, has No Known Allergies.  Review of Systems  All other systems reviewed and are negative.   Objective: BP 120/80   Ht 5\' 6"  (1.676 m)   Wt 220 lb (99.8 kg)   BMI 35.51 kg/m   Physical examination Constitutional NAD, Conversant  Skin No rashes, lesions or ulceration.   Extremities: Moves all appropriately.  Normal ROM for age. No lymphadenopathy.  Neuro: Grossly intact  Psych: Oriented to PPT.  Normal mood. Normal affect.   PELVIS TRANSVAGINAL NON-OB (TV ONLY)  Result Date: 09/11/2019 Patient Name: Alice Campbell DOB: 04/04/1951 MRN: [id number] ULTRASOUND REPORT Location: [facility] Date of Service: 09/11/2019 Indications:Abnormal Uterine Bleeding Findings: The uterus is  anteverted and measures 6.8 x 3.9 x 3.1 cm. Echo texture is homogenous without evidence of focal masses. The Endometrium measures 4.4 mm. The endometrium is complex. Right Ovary measures 2.3 x 1.4 x 0.9 cm. It is normal in appearance. Left Ovary measures 2.0 x 1.1 x 1.1 cm. It is normal in appearance. Survey of the adnexa demonstrates no adnexal masses. There is no free fluid in the cul de sac. Impression: 1. The endometrium is heterogeneous. 2. Otherwise the ultrasound is within normal limits. Recommendations: 1.Clinical correlation with the patient's History and Physical Exam. 2. Comparable to prior 09/13/2019 2019 2020, RT Review of ULTRASOUND.    I have personally reviewed images and report of recent ultrasound done at [facility].    Plan of management to be discussed with patient. [facility], MD, FACOG [facility], [facility] 09/11/2019  5:01 PM   Assessment:  Endometrial polyp  No symptoms and reassuring ultrasound. Recommend conservative management (no surgery)  A total of 20 minutes were spent face-to-face with the patient as well as preparation, review, communication, and documentation during this encounter.   09/13/2019, MD, [facility], [facility] 09/11/2019  5:09 PM

## 2019-09-16 ENCOUNTER — Encounter: Payer: Self-pay | Admitting: Family Medicine

## 2019-09-16 ENCOUNTER — Telehealth (INDEPENDENT_AMBULATORY_CARE_PROVIDER_SITE_OTHER): Payer: PPO | Admitting: Family Medicine

## 2019-09-16 ENCOUNTER — Other Ambulatory Visit: Payer: Self-pay

## 2019-09-16 VITALS — Ht 66.0 in | Wt 220.0 lb

## 2019-09-16 DIAGNOSIS — R9389 Abnormal findings on diagnostic imaging of other specified body structures: Secondary | ICD-10-CM | POA: Diagnosis not present

## 2019-09-16 DIAGNOSIS — M1651 Unilateral post-traumatic osteoarthritis, right hip: Secondary | ICD-10-CM | POA: Diagnosis not present

## 2019-09-16 DIAGNOSIS — Z1231 Encounter for screening mammogram for malignant neoplasm of breast: Secondary | ICD-10-CM | POA: Diagnosis not present

## 2019-09-16 DIAGNOSIS — I10 Essential (primary) hypertension: Secondary | ICD-10-CM | POA: Diagnosis not present

## 2019-09-16 DIAGNOSIS — E785 Hyperlipidemia, unspecified: Secondary | ICD-10-CM | POA: Diagnosis not present

## 2019-09-16 NOTE — Progress Notes (Signed)
Virtual Visit via video Note  This visit type was conducted due to national recommendations for restrictions regarding the COVID-19 pandemic (e.g. social distancing).  This format is felt to be most appropriate for this patient at this time.  All issues noted in this document were discussed and addressed.  No physical exam was performed (except for noted visual exam findings with Video Visits).   I connected with Alice Campbell today at  9:30 AM EDT by a video enabled telemedicine application or and verified that I am speaking with the correct person using two identifiers. Location patient: home Location provider: work Persons participating in the virtual visit: patient, provider  I discussed the limitations, risks, security and privacy concerns of performing an evaluation and management service by telephone and the availability of in person appointments. I also discussed with the patient that there may be a patient responsible charge related to this service. The patient expressed understanding and agreed to proceed.   Reason for visit: f/u.  HPI: HYPERTENSION  Disease Monitoring  Home BP Monitoring 120/80  Chest pain- no    Dyspnea- no Medications  Compliance-  Taking amlodipine.  Edema- no  HYPERLIPIDEMIA Symptoms Chest pain on exertion:  no   Medications: Compliance- taking lipitor Right upper quadrant pain- no  Muscle aches- no  Osteoarthritis: Stable.  She occasionally takes a tramadol as needed for pain no back.  Occasionally it does make her drowsy though she does not drive when this occurs.   ROS: See pertinent positives and negatives per HPI.  Past Medical History:  Diagnosis Date  . Arthritis   . Bulging lumbar disc   . Chicken pox   . GERD (gastroesophageal reflux disease)   . History of UTI    [facility]  . Hyperlipidemia   . Hypertension   . Psoriasis     Past Surgical History:  Procedure Laterality Date  . BIOPSY THYROID  07/2013  .  CHOLECYSTECTOMY  2011  . FRACTURE SURGERY Right    Hip  . KNEE ARTHROPLASTY Left 02/11/2019   Procedure: COMPUTER ASSISTED TOTAL KNEE ARTHROPLASTY - RNFA;  Surgeon: Dereck Leep, MD;  Location: [facility] ORS;  Service: Orthopedics;  Laterality: Left;    Family History  Problem Relation Age of Onset  . Heart disease Mother   . Hypertension Mother   . Mental illness Mother        Nervous breakdown   . Cancer Father        Prostate Cancer  . Heart disease Father   . Hypertension Father   . Diabetes Son     SOCIAL HX: Former smoker   Current Outpatient Medications:  .  amLODipine (NORVASC) 5 MG tablet, TAKE 1 TABLET BY MOUTH EVERY DAY, Disp: 90 tablet, Rfl: 1 .  atorvastatin (LIPITOR) 20 MG tablet, TAKE 1 TABLET BY MOUTH EVERY DAY (Patient taking differently: Take 20 mg by mouth daily. TAKE 1 TABLET BY MOUTH EVERY DAY), Disp: 90 tablet, Rfl: 2 .  clobetasol (TEMOVATE) 0.05 % external solution, Apply 1 application topically 2 (two) times daily as needed (psoriasis)., Disp: , Rfl:  .  clobetasol ointment (TEMOVATE) 5.59 %, Apply 1 application topically 2 (two) times daily as needed (psoriasis)., Disp: , Rfl:  .  gabapentin (NEURONTIN) 300 MG capsule, TAKE 1 CAPSULE BY MOUTH TWICE A DAY, Disp: 180 capsule, Rfl: 0 .  nystatin cream (MYCOSTATIN), APPLY 1 APPLICATION TOPICALLY TWICE A DAY (*MIX WITH TRIAMCINOLONE CREAM... FOR INS. COVERAGE) (Patient taking differently: Apply 1  application topically 2 (two) times daily. Mix with Triamcinolone Cream), Disp: 30 g, Rfl: 0 .  traMADol (ULTRAM) 50 MG tablet, TAKE 2 TABLETS BY MOUTH EVERY 8 (EIGHT) HOURS AS NEEDED FOR PAIN, Disp: 60 tablet, Rfl: 0 .  triamcinolone cream (KENALOG) 0.1 %, APPLY 1 APPLICATION TOPICALLY TWICE A DAY (MIX WITH NYSTATIN CREAM) (Patient taking differently: Apply 1 application topically 2 (two) times daily. Mix with Nystatin Cream), Disp: 30 g, Rfl: 0 .  trolamine salicylate (ASPERCREME) 10 % cream, Apply 1 application topically  as needed for muscle pain., Disp: , Rfl:   EXAM:  VITALS per patient if applicable:  GENERAL: alert, oriented, appears well and in no acute distress  HEENT: atraumatic, conjunttiva clear, no obvious abnormalities on inspection of external nose and ears  NECK: normal movements of the head and neck  LUNGS: on inspection no signs of respiratory distress, breathing rate appears normal, no obvious gross SOB, gasping or wheezing  CV: no obvious cyanosis  MS: moves all visible extremities without noticeable abnormality  PSYCH/NEURO: pleasant and cooperative, no obvious depression or anxiety, speech and thought processing grossly intact  ASSESSMENT AND PLAN:  Discussed the following assessment and plan:  HTN (hypertension) Well-controlled.  Continue current regimen.  She will come in for lab work.  Osteoarthritis Stable.  She will continue as needed tramadol.  She will let us know when she needs a refill.  Endometrial thickening on ultrasound She has completed evaluation through gynecology.  Conservative management recommended.  Hyperlipidemia Continue Lipitor.  Check lipid panel.  Encounter for screening mammogram for malignant neoplasm of breast Patient will call to schedule her mammogram.   Orders Placed This Encounter  Procedures  . MM 3D SCREEN BREAST BILATERAL    Standing Status:   Future    Standing Expiration Date:   11/15/2020    Order Specific Question:   Reason for Exam (SYMPTOM  OR DIAGNOSIS REQUIRED)    Answer:   breast cancer screening    Order Specific Question:   Preferred imaging location?    Answer:   [facility]  . [facility] ([facility])    Standing Status:   Future    Standing Expiration Date:   09/15/2020  . Lipid panel    Standing Status:   Future    Standing Expiration Date:   09/15/2020    No orders of the defined types were placed in this encounter.    I discussed the assessment and treatment plan with the patient. The patient was provided an  opportunity to ask questions and all were answered. The patient agreed with the plan and demonstrated an understanding of the instructions.   The patient was advised to call back or seek an in-person evaluation if the symptoms worsen or if the condition fails to improve as anticipated.   Tommi Rumps, MD

## 2019-09-16 NOTE — Assessment & Plan Note (Addendum)
-  Continue Lipitor °-Check lipid panel °

## 2019-09-16 NOTE — Assessment & Plan Note (Signed)
Patient will call to schedule her mammogram. 

## 2019-09-16 NOTE — Assessment & Plan Note (Signed)
Stable.  She will continue as needed tramadol.  She will let us know when she needs a refill.

## 2019-09-16 NOTE — Assessment & Plan Note (Signed)
She has completed evaluation through gynecology.  Conservative management recommended.

## 2019-09-16 NOTE — Assessment & Plan Note (Signed)
Well-controlled.  Continue current regimen.  She will come in for lab work. 

## 2019-09-18 ENCOUNTER — Other Ambulatory Visit: Payer: Self-pay | Admitting: Family Medicine

## 2019-10-01 ENCOUNTER — Other Ambulatory Visit: Payer: Self-pay | Admitting: Student

## 2019-10-01 DIAGNOSIS — M5416 Radiculopathy, lumbar region: Secondary | ICD-10-CM

## 2019-10-07 ENCOUNTER — Other Ambulatory Visit: Payer: Self-pay | Admitting: Family Medicine

## 2019-10-07 NOTE — Telephone Encounter (Signed)
Refill request for tramadol and gabapentin, last seen 09-16-19, last filled 08-25-19.  Please advise.

## 2019-10-08 ENCOUNTER — Ambulatory Visit
Admission: RE | Admit: 2019-10-08 | Discharge: 2019-10-08 | Disposition: A | Discharge status: Home / Self Care | Payer: PPO | Source: Ambulatory Visit | Attending: Student | Admitting: Student

## 2019-10-08 ENCOUNTER — Other Ambulatory Visit: Payer: Self-pay

## 2019-10-08 DIAGNOSIS — M545 Low back pain: Secondary | ICD-10-CM | POA: Diagnosis not present

## 2019-10-08 DIAGNOSIS — M5416 Radiculopathy, lumbar region: Secondary | ICD-10-CM

## 2019-10-08 MED ORDER — IOPAMIDOL (ISOVUE-M 200) INJECTION 41%
1.0000 mL | Freq: Once | INTRAMUSCULAR | Status: AC
  Administered 2019-10-08: 1 mL via EPIDURAL

## 2019-10-08 MED ORDER — METHYLPREDNISOLONE ACETATE 40 MG/ML INJ SUSP (RADIOLOG
120.0000 mg | Freq: Once | INTRAMUSCULAR | Status: AC
  Administered 2019-10-08: 120 mg via EPIDURAL

## 2019-10-08 NOTE — Discharge Instructions (Signed)

## 2019-10-21 ENCOUNTER — Other Ambulatory Visit: Payer: Self-pay

## 2019-10-21 ENCOUNTER — Other Ambulatory Visit (INDEPENDENT_AMBULATORY_CARE_PROVIDER_SITE_OTHER): Payer: PPO

## 2019-10-21 DIAGNOSIS — I10 Essential (primary) hypertension: Secondary | ICD-10-CM | POA: Diagnosis not present

## 2019-10-21 DIAGNOSIS — E785 Hyperlipidemia, unspecified: Secondary | ICD-10-CM

## 2019-10-21 LAB — LIPID PANEL
Cholesterol: 164 mg/dL (ref 0–200)
HDL: 59.2 mg/dL (ref >39.00)
LDL Cholesterol: 82 mg/dL (ref 0–99)
NonHDL: 104.76
Total CHOL/HDL Ratio: 3
Triglycerides: 115 mg/dL (ref 0.0–149.0)
VLDL: 23 mg/dL (ref 0.0–40.0)

## 2019-10-21 LAB — COMPREHENSIVE METABOLIC PANEL
ALT: 16 U/L (ref 0–35)
AST: 16 U/L (ref 0–37)
Albumin: 3.8 g/dL (ref 3.5–5.2)
Alkaline Phosphatase: 116 U/L (ref 39–117)
BUN: 18 mg/dL (ref 6–23)
CO2: 28 mEq/L (ref 19–32)
Calcium: 9.1 mg/dL (ref 8.4–10.5)
Chloride: 104 mEq/L (ref 96–112)
Creatinine, Ser: 0.76 mg/dL (ref 0.40–1.20)
GFR: 75.29 mL/min (ref >60.00)
Glucose, Bld: 92 mg/dL (ref 70–99)
Potassium: 4.1 mEq/L (ref 3.5–5.1)
Sodium: 138 mEq/L (ref 135–145)
Total Bilirubin: 0.9 mg/dL (ref 0.2–1.2)
Total Protein: 6.7 g/dL (ref 6.0–8.3)

## 2019-10-22 ENCOUNTER — Encounter: Payer: Self-pay | Admitting: Family Medicine

## 2019-10-29 ENCOUNTER — Encounter: Payer: Self-pay | Admitting: Family Medicine

## 2019-12-11 ENCOUNTER — Other Ambulatory Visit: Payer: Self-pay | Admitting: Family Medicine

## 2019-12-12 ENCOUNTER — Other Ambulatory Visit: Payer: Self-pay | Admitting: Family Medicine

## 2019-12-13 NOTE — Telephone Encounter (Signed)
Refill request for tramadol, last seen 09-16-19, last filled 10-07-19.  Please advise.

## 2019-12-26 ENCOUNTER — Other Ambulatory Visit: Payer: Self-pay | Admitting: Student

## 2019-12-26 DIAGNOSIS — M5416 Radiculopathy, lumbar region: Secondary | ICD-10-CM

## 2020-01-03 ENCOUNTER — Other Ambulatory Visit: Payer: Self-pay

## 2020-01-03 ENCOUNTER — Ambulatory Visit
Admission: RE | Admit: 2020-01-03 | Discharge: 2020-01-03 | Disposition: A | Discharge status: Home / Self Care | Payer: PPO | Source: Ambulatory Visit | Attending: Student | Admitting: Student

## 2020-01-03 DIAGNOSIS — M5416 Radiculopathy, lumbar region: Secondary | ICD-10-CM

## 2020-01-03 DIAGNOSIS — M545 Low back pain: Secondary | ICD-10-CM | POA: Diagnosis not present

## 2020-01-03 MED ORDER — METHYLPREDNISOLONE ACETATE 40 MG/ML INJ SUSP (RADIOLOG
120.0000 mg | Freq: Once | INTRAMUSCULAR | Status: AC
  Administered 2020-01-03: 120 mg via EPIDURAL

## 2020-01-03 MED ORDER — IOPAMIDOL (ISOVUE-M 200) INJECTION 41%
1.0000 mL | Freq: Once | INTRAMUSCULAR | Status: AC
  Administered 2020-01-03: 1 mL via EPIDURAL

## 2020-01-28 ENCOUNTER — Other Ambulatory Visit: Payer: Self-pay | Admitting: Family Medicine

## 2020-02-13 DIAGNOSIS — Z96652 Presence of left artificial knee joint: Secondary | ICD-10-CM | POA: Diagnosis not present

## 2020-03-04 ENCOUNTER — Other Ambulatory Visit: Payer: Self-pay | Admitting: Orthopedic Surgery

## 2020-03-04 DIAGNOSIS — M5416 Radiculopathy, lumbar region: Secondary | ICD-10-CM

## 2020-03-06 ENCOUNTER — Other Ambulatory Visit: Payer: Self-pay

## 2020-03-06 ENCOUNTER — Other Ambulatory Visit: Payer: Self-pay | Admitting: Orthopedic Surgery

## 2020-03-06 ENCOUNTER — Ambulatory Visit
Admission: RE | Admit: 2020-03-06 | Discharge: 2020-03-06 | Disposition: A | Discharge status: Home / Self Care | Payer: PPO | Source: Ambulatory Visit | Attending: Orthopedic Surgery | Admitting: Orthopedic Surgery

## 2020-03-06 DIAGNOSIS — M5416 Radiculopathy, lumbar region: Secondary | ICD-10-CM

## 2020-03-06 DIAGNOSIS — M5116 Intervertebral disc disorders with radiculopathy, lumbar region: Secondary | ICD-10-CM | POA: Diagnosis not present

## 2020-03-06 MED ORDER — IOPAMIDOL (ISOVUE-M 200) INJECTION 41%
1.0000 mL | Freq: Once | INTRAMUSCULAR | Status: AC
  Administered 2020-03-06: 1 mL via EPIDURAL

## 2020-03-06 MED ORDER — METHYLPREDNISOLONE ACETATE 40 MG/ML INJ SUSP (RADIOLOG
120.0000 mg | Freq: Once | INTRAMUSCULAR | Status: AC
  Administered 2020-03-06: 120 mg via EPIDURAL

## 2020-03-06 NOTE — Discharge Instructions (Signed)

## 2020-03-12 ENCOUNTER — Other Ambulatory Visit: Payer: Self-pay | Admitting: Family Medicine

## 2020-03-16 ENCOUNTER — Telehealth: Payer: Self-pay | Admitting: Family Medicine

## 2020-03-16 ENCOUNTER — Other Ambulatory Visit: Payer: Self-pay

## 2020-03-16 ENCOUNTER — Telehealth (INDEPENDENT_AMBULATORY_CARE_PROVIDER_SITE_OTHER): Payer: PPO | Admitting: Family Medicine

## 2020-03-16 ENCOUNTER — Encounter: Payer: Self-pay | Admitting: Family Medicine

## 2020-03-16 DIAGNOSIS — R109 Unspecified abdominal pain: Secondary | ICD-10-CM | POA: Diagnosis not present

## 2020-03-16 DIAGNOSIS — E785 Hyperlipidemia, unspecified: Secondary | ICD-10-CM

## 2020-03-16 DIAGNOSIS — I10 Essential (primary) hypertension: Secondary | ICD-10-CM

## 2020-03-16 MED ORDER — GABAPENTIN 100 MG PO CAPS
ORAL_CAPSULE | ORAL | 1 refills | Status: DC
Start: 2020-03-16 — End: 2020-09-14

## 2020-03-16 NOTE — Assessment & Plan Note (Signed)
Continue Lipitor.  She wondered about coming off of this and I noted that her cholesterol is adequately controlled because she is on medicine.  I would like her to remain on this to reduce her risk of stroke and heart attack.

## 2020-03-16 NOTE — Assessment & Plan Note (Signed)
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  We are going to increase her morning gabapentin dose to 400 mg.  She will continue her evening gabapentin dose of 300 mg.  She can continue tramadol 100 mg every 8 hours as needed.  She will monitor for drowsiness with increasing the gabapentin dose.  If she has drowsiness she will let us know.

## 2020-03-16 NOTE — Telephone Encounter (Signed)
Unable to leave voicemail for pt to return in three months for side pain

## 2020-03-16 NOTE — Assessment & Plan Note (Signed)
Adequate control for age.  I encouraged her to check it a little more frequently and let us know if it is trending up.  She will continue amlodipine 5 mg once daily.

## 2020-03-16 NOTE — Progress Notes (Signed)
Virtual Visit via telephone Note  This visit type was conducted due to national recommendations for restrictions regarding the COVID-19 pandemic (e.g. social distancing).  This format is felt to be most appropriate for this patient at this time.  All issues noted in this document were discussed and addressed.  No physical exam was performed (except for noted visual exam findings with Video Visits).   I connected with Alice Campbell today at 10:30 AM EDT by telephone and verified that I am speaking with the correct person using two identifiers. Location patient: home Location provider: work Persons participating in the virtual visit: patient, provider  I discussed the limitations, risks, security and privacy concerns of performing an evaluation and management service by telephone and the availability of in person appointments. I also discussed with the patient that there may be a patient responsible charge related to this service. The patient expressed understanding and agreed to proceed.  Interactive audio and video telecommunications were attempted between this provider and patient, however failed, due to patient having technical difficulties OR patient did not have access to video capability.  We continued and completed visit with audio only.   Reason for visit: f/u  HPI: HYPERTENSION  Disease Monitoring  Home BP Monitoring 135/84 on last check, though not checking frequently Chest pain- no    Dyspnea- no Medications  Compliance-  Taking amlodipine.   Edema- occasional if it is hot and humid, resolves over night.  HYPERLIPIDEMIA Symptoms Chest pain on exertion:  no    Medications: Compliance- taking lipitor Right upper quadrant pain- no  Muscle aches- no  Chronic right flank pain: This is an ongoing issue.  Felt to be related to nerve impingement.  She takes gabapentin and tramadol for this.  She wonders if the gabapentin is not strong enough.  The pain typically only bothers her in  the morning.  She will take gabapentin and then take tramadol and then within an hour the pain is improved.       ROS: See pertinent positives and negatives per HPI.  Past Medical History:  Diagnosis Date  . Arthritis   . Bulging lumbar disc   . Chicken pox   . GERD (gastroesophageal reflux disease)   . History of UTI    [facility]  . Hyperlipidemia   . Hypertension   . Psoriasis     Past Surgical History:  Procedure Laterality Date  . BIOPSY THYROID  07/2013  . CHOLECYSTECTOMY  2011  . FRACTURE SURGERY Right    Hip  . KNEE ARTHROPLASTY Left 02/11/2019   Procedure: COMPUTER ASSISTED TOTAL KNEE ARTHROPLASTY - RNFA;  Surgeon: Donato Heinz, MD;  Location: [facility] OR[facility];  Service: Orthopedics;  Laterality: Left;    Family History  Problem Relation Age of Onset  . Heart disease Mother   . Hypertension Mother   . Mental illness Mother        Nervous breakdown   . Cancer Father        Prostate Cancer  . Heart disease Father   . Hypertension Father   . Diabetes Son     SOCIAL HX: Former smoker   Current Outpatient Medications:  .  amLODipine (NORVASC) 5 MG tablet, TAKE 1 TABLET BY MOUTH EVERY DAY, Disp: 90 tablet, Rfl: 1 .  atorvastatin (LIPITOR) 20 MG tablet, TAKE 1 TABLET BY MOUTH EVERY DAY, Disp: 90 tablet, Rfl: 0 .  clobetasol (TEMOVATE) 0.05 % external solution, Apply 1 application topically 2 (two) times daily as  needed (psoriasis)., Disp: , Rfl:  .  clobetasol ointment (TEMOVATE) 0.05 %, Apply 1 application topically 2 (two) times daily as needed (psoriasis)., Disp: , Rfl:  .  gabapentin (NEURONTIN) 100 MG capsule, Take 4 capsules (400 mg) by mouth in the morning and take 3 capsules (300 mg) by mouth in the evening., Disp: 630 capsule, Rfl: 1 .  nystatin cream (MYCOSTATIN), APPLY 1 APPLICATION TOPICALLY TWICE A DAY (*MIX WITH TRIAMCINOLONE CREAM... FOR INS. COVERAGE) (Patient taking differently: Apply 1 application topically 2 (two) times daily. Mix with  Triamcinolone Cream), Disp: 30 g, Rfl: 0 .  traMADol (ULTRAM) 50 MG tablet, TAKE 2 TABLETS BY MOUTH EVERY 8 (EIGHT) HOURS AS NEEDED FOR PAIN, Disp: 60 tablet, Rfl: 0 .  triamcinolone cream (KENALOG) 0.1 %, APPLY 1 APPLICATION TOPICALLY TWICE A DAY (MIX WITH NYSTATIN CREAM) (Patient taking differently: Apply 1 application topically 2 (two) times daily. Mix with Nystatin Cream), Disp: 30 g, Rfl: 0 .  trolamine salicylate (ASPERCREME) 10 % cream, Apply 1 application topically as needed for muscle pain., Disp: , Rfl:   EXAM: This is a telephone visit and thus no physical exam was completed.  ASSESSMENT AND PLAN:  Discussed the following assessment and plan:  HTN (hypertension) Adequate control for age.  I encouraged her to check it a little more frequently and let us know if it is trending up.  She will continue amlodipine 5 mg once daily.  Hyperlipidemia Continue Lipitor.  She wondered about coming off of this and I noted that her cholesterol is adequately controlled because she is on medicine.  I would like her to remain on this to reduce her risk of stroke and heart attack.  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  We are going to increase her morning gabapentin dose to 400 mg.  She will continue her evening gabapentin dose of 300 mg.  She can continue tramadol 100 mg every 8 hours as needed.  She will monitor for drowsiness with increasing the gabapentin dose.  If she has drowsiness she will let us know.   No orders of the defined types were placed in this encounter.   Meds ordered this encounter  Medications  . gabapentin (NEURONTIN) 100 MG capsule    Sig: Take 4 capsules (400 mg) by mouth in the morning and take 3 capsules (300 mg) by mouth in the evening.    Dispense:  630 capsule    Refill:  1     I discussed the assessment and treatment plan with the patient. The patient was provided an opportunity to ask questions and all were answered. The patient agreed  with the plan and demonstrated an understanding of the instructions.   The patient was advised to call back or seek an in-person evaluation if the symptoms worsen or if the condition fails to improve as anticipated.  I provided 12 minutes of non-face-to-face time during this encounter.   Marikay Alar, MD

## 2020-03-19 ENCOUNTER — Other Ambulatory Visit: Payer: Self-pay | Admitting: Family Medicine

## 2020-05-04 ENCOUNTER — Other Ambulatory Visit: Payer: Self-pay | Admitting: Family Medicine

## 2020-06-01 DIAGNOSIS — M7061 Trochanteric bursitis, right hip: Secondary | ICD-10-CM | POA: Diagnosis not present

## 2020-06-09 ENCOUNTER — Other Ambulatory Visit: Payer: Self-pay | Admitting: Family Medicine

## 2020-07-20 ENCOUNTER — Other Ambulatory Visit: Payer: Self-pay

## 2020-07-20 ENCOUNTER — Ambulatory Visit
Admission: RE | Admit: 2020-07-20 | Discharge: 2020-07-20 | Disposition: A | Discharge status: Home / Self Care | Payer: PPO | Source: Ambulatory Visit | Attending: Family Medicine | Admitting: Family Medicine

## 2020-07-20 DIAGNOSIS — Z1231 Encounter for screening mammogram for malignant neoplasm of breast: Secondary | ICD-10-CM | POA: Insufficient documentation

## 2020-07-23 ENCOUNTER — Telehealth (INDEPENDENT_AMBULATORY_CARE_PROVIDER_SITE_OTHER): Payer: PPO | Admitting: Family Medicine

## 2020-07-23 ENCOUNTER — Encounter: Payer: Self-pay | Admitting: Family Medicine

## 2020-07-23 DIAGNOSIS — R300 Dysuria: Secondary | ICD-10-CM

## 2020-07-23 MED ORDER — CEPHALEXIN 500 MG PO CAPS: 500.0000 mg | ORAL_CAPSULE | Freq: Four times a day (QID) | ORAL | 0 refills | Status: DC

## 2020-07-23 NOTE — Assessment & Plan Note (Signed)
Concern for UTI based on symptoms.  We will treat empirically with Keflex.  If not improving with this she will let us know and we can collect a urine at that time.  She did ask about intercourse and I advised to refrain from that until she has recovered.  Discussed that intercourse does place women at increased risk for UTIs.  Discussed urinating after intercourse to limit this risk.

## 2020-07-23 NOTE — Progress Notes (Signed)
Virtual Visit via video Note  This visit type was conducted due to national recommendations for restrictions regarding the COVID-19 pandemic (e.g. social distancing).  This format is felt to be most appropriate for this patient at this time.  All issues noted in this document were discussed and addressed.  No physical exam was performed (except for noted visual exam findings with Video Visits).   I connected with Alice Campbell today at  9:00 AM EST by a video enabled telemedicine application and verified that I am speaking with the correct person using two identifiers. Location patient: home Location provider: home office Persons participating in the virtual visit: patient, provider  I discussed the limitations, risks, security and privacy concerns of performing an evaluation and management service by telephone and the availability of in person appointments. I also discussed with the patient that there may be a patient responsible charge related to this service. The patient expressed understanding and agreed to proceed.  Reason for visit: f/u  HPI: UTI: Onset last couple of days. Has history of UTIs. Dysuria- yes  Frequency- yes   Urgency- yes   Hematuria- no    Abd pain- no   Vaginal d/c- no    ROS: See pertinent positives and negatives per HPI.  Past Medical History:  Diagnosis Date  . Arthritis   . Bulging lumbar disc   . Chicken pox   . GERD (gastroesophageal reflux disease)   . History of UTI    [facility]  . Hyperlipidemia   . Hypertension   . Psoriasis     Past Surgical History:  Procedure Laterality Date  . BIOPSY THYROID  07/2013  . CHOLECYSTECTOMY  2011  . FRACTURE SURGERY Right    Hip  . KNEE ARTHROPLASTY Left 02/11/2019   Procedure: COMPUTER ASSISTED TOTAL KNEE ARTHROPLASTY - RNFA;  Surgeon: Donato Heinz, MD;  Location: [facility];  Service: Orthopedics;  Laterality: Left;    Family History  Problem Relation Age of Onset  . Heart disease  Mother   . Hypertension Mother   . Mental illness Mother        Nervous breakdown   . Cancer Father        Prostate Cancer  . Heart disease Father   . Hypertension Father   . Diabetes Son   . Breast cancer Neg Hx     SOCIAL HX: Former smoker   Current Outpatient Medications:  .  amLODipine (NORVASC) 5 MG tablet, TAKE 1 TABLET BY MOUTH EVERY DAY, Disp: 90 tablet, Rfl: 1 .  atorvastatin (LIPITOR) 20 MG tablet, TAKE 1 TABLET BY MOUTH EVERY DAY, Disp: 90 tablet, Rfl: 0 .  cephALEXin (KEFLEX) 500 MG capsule, Take 1 capsule (500 mg total) by mouth 4 (four) times daily., Disp: 20 capsule, Rfl: 0 .  gabapentin (NEURONTIN) 100 MG capsule, Take 4 capsules (400 mg) by mouth in the morning and take 3 capsules (300 mg) by mouth in the evening., Disp: 630 capsule, Rfl: 1 .  nystatin cream (MYCOSTATIN), APPLY 1 APPLICATION TOPICALLY TWICE A DAY (*MIX WITH TRIAMCINOLONE CREAM... FOR INS. COVERAGE) (Patient taking differently: Apply 1 application topically 2 (two) times daily. Mix with Triamcinolone Cream), Disp: 30 g, Rfl: 0 .  traMADol (ULTRAM) 50 MG tablet, TAKE 2 TABLETS BY MOUTH EVERY 8 HOURS AS NEEDED FOR PAIN, Disp: 60 tablet, Rfl: 0 .  trolamine salicylate (ASPERCREME) 10 % cream, Apply 1 application topically as needed for muscle pain., Disp: , Rfl:   EXAM:  VITALS  per patient if applicable:  GENERAL: alert, oriented, appears well and in no acute distress  HEENT: atraumatic, conjunttiva clear, no obvious abnormalities on inspection of external nose and ears  NECK: normal movements of the head and neck  LUNGS: on inspection no signs of respiratory distress, breathing rate appears normal, no obvious gross SOB, gasping or wheezing  CV: no obvious cyanosis  MS: moves all visible extremities without noticeable abnormality  PSYCH/NEURO: pleasant and cooperative, no obvious depression or anxiety, speech and thought processing grossly intact  ASSESSMENT AND PLAN:  Discussed the following  assessment and plan:  Problem List Items Addressed This Visit    Dysuria    Concern for UTI based on symptoms.  We will treat empirically with Keflex.  If not improving with this she will let us know and we can collect a urine at that time.  She did ask about intercourse and I advised to refrain from that until she has recovered.  Discussed that intercourse does place women at increased risk for UTIs.  Discussed urinating after intercourse to limit this risk.      Relevant Medications   cephALEXin (KEFLEX) 500 MG capsule       I discussed the assessment and treatment plan with the patient. The patient was provided an opportunity to ask questions and all were answered. The patient agreed with the plan and demonstrated an understanding of the instructions.   The patient was advised to call back or seek an in-person evaluation if the symptoms worsen or if the condition fails to improve as anticipated.  Marikay Alar, MD

## 2020-08-01 ENCOUNTER — Other Ambulatory Visit: Payer: Self-pay | Admitting: Family Medicine

## 2020-08-13 ENCOUNTER — Ambulatory Visit: Payer: PPO

## 2020-08-18 ENCOUNTER — Other Ambulatory Visit: Payer: Self-pay | Admitting: Student

## 2020-08-18 DIAGNOSIS — M5416 Radiculopathy, lumbar region: Secondary | ICD-10-CM

## 2020-08-20 ENCOUNTER — Ambulatory Visit
Admission: RE | Admit: 2020-08-20 | Discharge: 2020-08-20 | Disposition: A | Discharge status: Home / Self Care | Payer: PPO | Source: Ambulatory Visit | Attending: Student | Admitting: Student

## 2020-08-20 ENCOUNTER — Other Ambulatory Visit: Payer: Self-pay | Admitting: Student

## 2020-08-20 DIAGNOSIS — M47817 Spondylosis without myelopathy or radiculopathy, lumbosacral region: Secondary | ICD-10-CM | POA: Diagnosis not present

## 2020-08-20 DIAGNOSIS — M5416 Radiculopathy, lumbar region: Secondary | ICD-10-CM

## 2020-08-20 MED ORDER — METHYLPREDNISOLONE ACETATE 40 MG/ML INJ SUSP (RADIOLOG
120.0000 mg | Freq: Once | INTRAMUSCULAR | Status: AC
  Administered 2020-08-20: 120 mg via EPIDURAL

## 2020-08-20 MED ORDER — IOPAMIDOL (ISOVUE-M 200) INJECTION 41%
1.0000 mL | Freq: Once | INTRAMUSCULAR | Status: AC
  Administered 2020-08-20: 1 mL via EPIDURAL

## 2020-08-20 NOTE — Discharge Instructions (Signed)

## 2020-08-27 ENCOUNTER — Ambulatory Visit (INDEPENDENT_AMBULATORY_CARE_PROVIDER_SITE_OTHER): Payer: PPO

## 2020-08-27 VITALS — Ht 65.98 in | Wt 225.0 lb

## 2020-08-27 DIAGNOSIS — Z0000 Encounter for general adult medical examination without abnormal findings: Secondary | ICD-10-CM

## 2020-08-27 NOTE — Progress Notes (Signed)
Subjective:   Alice Campbell is a 70 y.o. female who presents for Medicare Annual (Subsequent) preventive examination.  Review of Systems    No ROS.  Medicare Wellness Virtual Visit.   Cardiac Risk Factors include: advanced age (>15men, >2 women)     Objective:    Today's Vitals   08/27/20 1137  Weight: 225 lb (102.1 kg)  Height: 5' 5.98" (1.676 m)   Body mass index is 36.34 kg/m.  Advanced Directives 08/27/2020 07/23/2019 02/11/2019 02/05/2019 07/18/2018 05/24/2016 01/19/2016  Does Patient Have a Medical Advance Directive? No No No No No No No  Would patient like information on creating a medical advance directive? No - Patient declined No - Patient declined No - Patient declined No - Patient declined No - Patient declined (No Data) Yes - Educational materials given    Current Medications (verified) Outpatient Encounter Medications as of 08/27/2020  Medication Sig  . amLODipine (NORVASC) 5 MG tablet TAKE 1 TABLET BY MOUTH EVERY DAY  . atorvastatin (LIPITOR) 20 MG tablet TAKE 1 TABLET BY MOUTH EVERY DAY  . cephALEXin (KEFLEX) 500 MG capsule Take 1 capsule (500 mg total) by mouth 4 (four) times daily.  Marland Kitchen gabapentin (NEURONTIN) 100 MG capsule Take 4 capsules (400 mg) by mouth in the morning and take 3 capsules (300 mg) by mouth in the evening.  . nystatin cream (MYCOSTATIN) APPLY 1 APPLICATION TOPICALLY TWICE A DAY (*MIX WITH TRIAMCINOLONE CREAM... FOR INS. COVERAGE) (Patient taking differently: Apply 1 application topically 2 (two) times daily. Mix with Triamcinolone Cream)  . traMADol (ULTRAM) 50 MG tablet TAKE 2 TABLETS BY MOUTH EVERY 8 HOURS AS NEEDED FOR PAIN  . trolamine salicylate (ASPERCREME) 10 % cream Apply 1 application topically as needed for muscle pain.   No facility-administered encounter medications on file as of 08/27/2020.    Allergies (verified) Patient has no known allergies.   History: Past Medical History:  Diagnosis Date  . Arthritis   . Bulging lumbar disc    . Chicken pox   . GERD (gastroesophageal reflux disease)   . History of UTI    [facility]  . Hyperlipidemia   . Hypertension   . Psoriasis    Past Surgical History:  Procedure Laterality Date  . BIOPSY THYROID  07/2013  . CHOLECYSTECTOMY  2011  . FRACTURE SURGERY Right    Hip  . KNEE ARTHROPLASTY Left 02/11/2019   Procedure: COMPUTER ASSISTED TOTAL KNEE ARTHROPLASTY - RNFA;  Surgeon: Donato Heinz, MD;  Location: [facility] ORS;  Service: Orthopedics;  Laterality: Left;   Family History  Problem Relation Age of Onset  . Heart disease Mother   . Hypertension Mother   . Mental illness Mother        Nervous breakdown   . Cancer Father        Prostate Cancer  . Heart disease Father   . Hypertension Father   . Diabetes Son   . Breast cancer Neg Hx    Social History   Socioeconomic History  . Marital status: Married    Spouse name: Not on file  . Number of children: Not on file  . Years of education: 54  . Highest education level: Not on file  Occupational History  . Occupation: Paramedic Work    Comment: Semi-retired  Tobacco Use  . Smoking status: Former Smoker    Quit date: 01/19/1996    Years since quitting: 24.6  . Smokeless tobacco: Never Used  . Tobacco comment: quit in  2000  Vaping Use  . Vaping Use: Never used  Substance and Sexual Activity  . Alcohol use: Yes    Comment: 1-2 drinks per month  . Drug use: No  . Sexual activity: Yes  Other Topics Concern  . Not on file  Social History Narrative   [name] grew up in [place], [place]. She is currently living in [place] with her husband. She enjoys swimming and reading.   Social Determinants of Health   Financial Resource Strain: Low Risk   . Difficulty of Paying Living Expenses: Not hard at all  Food Insecurity: No Food Insecurity  . Worried About Programme researcher, broadcasting/film/videounning Out of Food in the Last Year: Never true  . Ran Out of Food in the Last Year: Never true  Transportation Needs: No Transportation Needs  .  Lack of Transportation (Medical): No  . Lack of Transportation (Non-Medical): No  Physical Activity: Not on file  Stress: No Stress Concern Present  . Feeling of Stress : Not at all  Social Connections: Unknown  . Frequency of Communication with Friends and Family: More than three times a week  . Frequency of Social Gatherings with Friends and Family: More than three times a week  . Attends Religious Services: Not on file  . Active Member of Clubs or Organizations: Not on file  . Attends BankerClub or Organization Meetings: Not on file  . Marital Status: Not on file    Tobacco Counseling Counseling given: Not Answered Comment: quit in 2000   Clinical Intake:  Pre-visit preparation completed: Yes        Diabetes: No  How often do you need to have someone help you when you read instructions, pamphlets, or other written materials from your doctor or pharmacy?: 1 - Never   Interpreter Needed?: No      Activities of Daily Living In your present state of health, do you have any difficulty performing the following activities: 08/27/2020  Hearing? N  Vision? N  Difficulty concentrating or making decisions? N  Walking or climbing stairs? Y  Dressing or bathing? N  Doing errands, shopping? N  Preparing Food and eating ? N  Using the Toilet? N  In the past six months, have you accidently leaked urine? N  Do you have problems with loss of bowel control? N  Managing your Medications? N  Managing your Finances? N  Housekeeping or managing your Housekeeping? N  Some recent data might be hidden    Patient Care Team: Glori LuisSonnenberg, Eric G, MD as PCP - General (Family Medicine)  Indicate any recent Medical Services you may have received from other than [facility] providers in the past year (date may be approximate).     Assessment:   This is a routine wellness examination for Alice Campbell.  I connected with Alice Campbell today by telephone and verified that I am speaking with the correct person using  two identifiers. Location patient: home Location provider: work Persons participating in the virtual visit: patient, Engineer, civil (consulting)nurse.    I discussed the limitations, risks, security and privacy concerns of performing an evaluation and management service by telephone and the availability of in person appointments. The patient expressed understanding and verbally consented to this telephonic visit.    Interactive audio and video telecommunications were attempted between this provider and patient, however failed, due to patient having technical difficulties OR patient did not have access to video capability.  We continued and completed visit with audio only.  Some vital signs may be absent or patient  reported.   Hearing/Vision screen  Hearing Screening   125Hz  250Hz  500Hz  1000Hz  2000Hz  3000Hz  4000Hz  6000Hz  8000Hz   Right ear:           Left ear:           Comments: Patient is able to hear conversational tones without difficulty.  No issues reported.  Vision Screening Comments: Wears corrective lenses Visual acuity not assessed, virtual visit.  They have seen their ophthalmologist in the last 12 months.    Dietary issues and exercise activities discussed:    Healthy diet Good water intake  Goals    . Healthy Lifestyle     Stay active Low carb diet Stay hydrated      Depression Screen PHQ 2/9 Scores 08/27/2020 07/23/2020 03/16/2020 07/23/2019 06/11/2019 07/18/2018 04/17/2017  PHQ - 2 Score 0 0 0 0 0 0 0    Fall Risk Fall Risk  08/27/2020 07/23/2020 03/16/2020 07/23/2019 06/11/2019  Falls in the past year? 0 0 0 0 0  Number falls in past yr: 0 0 0 - 0  Comment - - - - -  Injury with Fall? 0 0 - - -  Risk Factor Category  - - - - -  Risk for fall due to : - - - - -  Follow up Falls evaluation completed Falls evaluation completed Falls evaluation completed Falls evaluation completed Falls evaluation completed    FALL RISK PREVENTION PERTAINING TO THE HOME: Handrails in use when climbing stairs?  Yes Home free of loose throw rugs in walkways, pet beds, electrical cords, etc? Yes  Adequate lighting in your home to reduce risk of falls? Yes   ASSISTIVE DEVICES UTILIZED TO PREVENT FALLS: Life alert? No  Use of a cane, walker or w/c? No   TIMED UP AND GO: Was the test performed? No . Virtual; visit.   Cognitive Function: Patient is alert and oriented x3.  Denies difficulty, making decisions, memory loss.  Enjoys playing brain challenging games.  MMSE/6CIT deferred. Normal by direct communication/observation.  MMSE - Mini Mental State Exam 07/18/2018  Orientation to time 5  Orientation to Place 5  Registration 3  Attention/ Calculation 5  Recall 3  Language- name 2 objects 2  Language- repeat 1  Language- follow 3 step command 3  Language- read & follow direction 1  Write a sentence 1  Copy design 1  Total score 30     6CIT Screen 07/23/2019  What Year? 0 points  What month? 0 points  What time? 0 points  Count back from 20 0 points  Months in reverse 0 points  Repeat phrase 0 points  Total Score 0    Immunizations Immunization History  Administered Date(s) Administered  . Fluad Quad(high Dose 65+) 03/20/2019  . Influenza, High Dose Seasonal PF 04/17/2017, 04/15/2018, 03/20/2020  . Influenza,inj,Quad PF,6+ Mos 04/24/2017  . PFIZER(Purple Top)SARS-COV-2 Vaccination 08/08/2019, 08/29/2019, 03/27/2020  . Pneumococcal Polysaccharide-23 07/03/2018  . Td 12/09/2013  . Zoster 12/24/2013    Health Maintenance Health Maintenance  Topic Date Due  . DEXA SCAN  08/27/2021 (Originally 01/20/2015)  . PNA vac Low Risk Adult (2 of 2 - PCV13) 08/27/2021 (Originally 07/04/2019)  . MAMMOGRAM  07/20/2022  . TETANUS/TDAP  12/10/2023  . COLONOSCOPY (Pts 45-24yrs Insurance coverage will need to be confirmed)  03/21/2024  . INFLUENZA VACCINE  Completed  . COVID-19 Vaccine  Completed  . Hepatitis C Screening  Completed  . HPV VACCINES  Aged Out   Colorectal cancer screening:  Type  of screening: Colonoscopy. Completed 03/21/14. Repeat every 10 years  Mammogram status: Completed 07/20/20. Repeat every year  Lung Cancer Screening: (Low Dose CT Chest recommended if Age 53-80 years, 30 pack-year currently smoking OR have quit w/in 15years.) does not qualify.   Dental Screening: Recommended annual dental exams for proper oral hygiene  Community Resource Referral / Chronic Care Management: CRR required this visit?  No   CCM required this visit?  No      Plan:   Keep all routine maintenance appointments.   I have personally reviewed and noted the following in the patient's chart:   . Medical and social history . Use of alcohol, tobacco or illicit drugs  . Current medications and supplements . Functional ability and status . Nutritional status . Physical activity . Advanced directives . List of other physicians . Hospitalizations, surgeries, and ER visits in previous 12 months . Vitals . Screenings to include cognitive, depression, and falls . Referrals and appointments  In addition, I have reviewed and discussed with patient certain preventive protocols, quality metrics, and best practice recommendations. A written personalized care plan for preventive services as well as general preventive health recommendations were provided to patient via mail.     Ashok Pall, LPN   10/28/6642

## 2020-08-27 NOTE — Patient Instructions (Addendum)
Alice Campbell , Thank you for taking time to come for your Medicare Wellness Visit. I appreciate your ongoing commitment to your health goals. Please review the following plan we discussed and let me know if I can assist you in the future.   These are the goals we discussed: Goals    . Healthy Lifestyle     Stay active Low carb diet Stay hydrated       This is a list of the screening recommended for you and due dates:  Health Maintenance  Topic Date Due  . DEXA scan (bone density measurement)  08/27/2021*  . Pneumonia vaccines (2 of 2 - PCV13) 08/27/2021*  . Mammogram  07/20/2022  . Tetanus Vaccine  12/10/2023  . Colon Cancer Screening  03/21/2024  . Flu Shot  Completed  . COVID-19 Vaccine  Completed  .  Hepatitis C: One time screening is recommended by Center for Disease Control  (CDC) for  adults born from [birth year] through 1966.   Completed  . HPV Vaccine  Aged Out  *Topic was postponed. The date shown is not the original due date.   Advanced directives: End of life planning; Advance aging; Advanced directives discussed.  Copy of current HCPOA/Living Will requested.    Conditions/risks identified: none new  Follow up in one year for your annual wellness visit    Preventive Care 65 Years and Older, Female Preventive care refers to lifestyle choices and visits with your health care provider that can promote health and wellness. What does preventive care include?  A yearly physical exam. This is also called an annual well check.  Dental exams once or twice a year.  Routine eye exams. Ask your health care provider how often you should have your eyes checked.  Personal lifestyle choices, including:  Daily care of your teeth and gums.  Regular physical activity.  Eating a healthy diet.  Avoiding tobacco and drug use.  Limiting alcohol use.  Practicing safe sex.  Taking low-dose aspirin every day.  Taking vitamin and mineral supplements as recommended by your health  care provider. What happens during an annual well check? The services and screenings done by your health care provider during your annual well check will depend on your age, overall health, lifestyle risk factors, and family history of disease. Counseling  Your health care provider may ask you questions about your:  Alcohol use.  Tobacco use.  Drug use.  Emotional well-being.  Home and relationship well-being.  Sexual activity.  Eating habits.  History of falls.  Memory and ability to understand (cognition).  Work and work Astronomer.  Reproductive health. Screening  You may have the following tests or measurements:  Height, weight, and BMI.  Blood pressure.  Lipid and cholesterol levels. These may be checked every 5 years, or more frequently if you are over 59 years old.  Skin check.  Lung cancer screening. You may have this screening every year starting at age 39 if you have a 30-pack-year history of smoking and currently smoke or have quit within the past 15 years.  Fecal occult blood test (FOBT) of the stool. You may have this test every year starting at age 54.  Flexible sigmoidoscopy or colonoscopy. You may have a sigmoidoscopy every 5 years or a colonoscopy every 10 years starting at age 76.  Hepatitis C blood test.  Hepatitis B blood test.  Sexually transmitted disease (STD) testing.  Diabetes screening. This is done by checking your blood sugar (glucose) after you have  not eaten for a while (fasting). You may have this done every 1-3 years.  Bone density scan. This is done to screen for osteoporosis. You may have this done starting at age 67.  Mammogram. This may be done every 1-2 years. Talk to your health care provider about how often you should have regular mammograms. Talk with your health care provider about your test results, treatment options, and if necessary, the need for more tests. Vaccines  Your health care provider may recommend certain  vaccines, such as:  Influenza vaccine. This is recommended every year.  Tetanus, diphtheria, and acellular pertussis (Tdap, Td) vaccine. You may need a Td booster every 10 years.  Zoster vaccine. You may need this after age 78.  Pneumococcal 13-valent conjugate (PCV13) vaccine. One dose is recommended after age 45.  Pneumococcal polysaccharide (PPSV23) vaccine. One dose is recommended after age 41. Talk to your health care provider about which screenings and vaccines you need and how often you need them. This information is not intended to replace advice given to you by your health care provider. Make sure you discuss any questions you have with your health care provider. Document Released: 07/10/2015 Document Revised: 03/02/2016 Document Reviewed: 04/14/2015 Elsevier Interactive Patient Education  2017 [employer].  Fall Prevention in the Home Falls can cause injuries. They can happen to people of all ages. There are many things you can do to make your home safe and to help prevent falls. What can I do on the outside of my home?  Regularly fix the edges of walkways and driveways and fix any cracks.  Remove anything that might make you trip as you walk through a door, such as a raised step or threshold.  Trim any bushes or trees on the path to your home.  Use bright outdoor lighting.  Clear any walking paths of anything that might make someone trip, such as rocks or tools.  Regularly check to see if handrails are loose or broken. Make sure that both sides of any steps have handrails.  Any raised decks and porches should have guardrails on the edges.  Have any leaves, snow, or ice cleared regularly.  Use sand or salt on walking paths during winter.  Clean up any spills in your garage right away. This includes oil or grease spills. What can I do in the bathroom?  Use night lights.  Install grab bars by the toilet and in the tub and shower. Do not use towel bars as grab  bars.  Use non-skid mats or decals in the tub or shower.  If you need to sit down in the shower, use a plastic, non-slip stool.  Keep the floor dry. Clean up any water that spills on the floor as soon as it happens.  Remove soap buildup in the tub or shower regularly.  Attach bath mats securely with double-sided non-slip rug tape.  Do not have throw rugs and other things on the floor that can make you trip. What can I do in the bedroom?  Use night lights.  Make sure that you have a light by your bed that is easy to reach.  Do not use any sheets or blankets that are too big for your bed. They should not hang down onto the floor.  Have a firm chair that has side arms. You can use this for support while you get dressed.  Do not have throw rugs and other things on the floor that can make you trip. What can  I do in the kitchen?  Clean up any spills right away.  Avoid walking on wet floors.  Keep items that you use a lot in easy-to-reach places.  If you need to reach something above you, use a strong step stool that has a grab bar.  Keep electrical cords out of the way.  Do not use floor polish or wax that makes floors slippery. If you must use wax, use non-skid floor wax.  Do not have throw rugs and other things on the floor that can make you trip. What can I do with my stairs?  Do not leave any items on the stairs.  Make sure that there are handrails on both sides of the stairs and use them. Fix handrails that are broken or loose. Make sure that handrails are as long as the stairways.  Check any carpeting to make sure that it is firmly attached to the stairs. Fix any carpet that is loose or worn.  Avoid having throw rugs at the top or bottom of the stairs. If you do have throw rugs, attach them to the floor with carpet tape.  Make sure that you have a light switch at the top of the stairs and the bottom of the stairs. If you do not have them, ask someone to add them for  you. What else can I do to help prevent falls?  Wear shoes that:  Do not have high heels.  Have rubber bottoms.  Are comfortable and fit you well.  Are closed at the toe. Do not wear sandals.  If you use a stepladder:  Make sure that it is fully opened. Do not climb a closed stepladder.  Make sure that both sides of the stepladder are locked into place.  Ask someone to hold it for you, if possible.  Clearly mark and make sure that you can see:  Any grab bars or handrails.  First and last steps.  Where the edge of each step is.  Use tools that help you move around (mobility aids) if they are needed. These include:  Canes.  Walkers.  Scooters.  Crutches.  Turn on the lights when you go into a dark area. Replace any light bulbs as soon as they burn out.  Set up your furniture so you have a clear path. Avoid moving your furniture around.  If any of your floors are uneven, fix them.  If there are any pets around you, be aware of where they are.  Review your medicines with your doctor. Some medicines can make you feel dizzy. This can increase your chance of falling. Ask your doctor what other things that you can do to help prevent falls. This information is not intended to replace advice given to you by your health care provider. Make sure you discuss any questions you have with your health care provider. Document Released: 04/09/2009 Document Revised: 11/19/2015 Document Reviewed: 07/18/2014 Elsevier Interactive Patient Education  2017 [employer].

## 2020-08-31 DIAGNOSIS — M5417 Radiculopathy, lumbosacral region: Secondary | ICD-10-CM | POA: Diagnosis not present

## 2020-08-31 DIAGNOSIS — M5416 Radiculopathy, lumbar region: Secondary | ICD-10-CM | POA: Diagnosis not present

## 2020-08-31 DIAGNOSIS — M47816 Spondylosis without myelopathy or radiculopathy, lumbar region: Secondary | ICD-10-CM | POA: Diagnosis not present

## 2020-09-12 ENCOUNTER — Other Ambulatory Visit: Payer: Self-pay | Admitting: Family Medicine

## 2020-09-14 ENCOUNTER — Other Ambulatory Visit: Payer: Self-pay | Admitting: Student

## 2020-09-14 DIAGNOSIS — M5416 Radiculopathy, lumbar region: Secondary | ICD-10-CM

## 2020-09-17 ENCOUNTER — Other Ambulatory Visit: Payer: Self-pay | Admitting: Family Medicine

## 2020-09-28 ENCOUNTER — Other Ambulatory Visit: Payer: Self-pay | Admitting: Family Medicine

## 2020-09-28 NOTE — Telephone Encounter (Signed)
This patient needs follow-up scheduled before getting a refill. Please call her to schedule a follow-up.

## 2020-09-28 NOTE — Telephone Encounter (Signed)
Appointment has been scheduled.

## 2020-09-30 ENCOUNTER — Other Ambulatory Visit: Payer: Self-pay

## 2020-10-01 ENCOUNTER — Telehealth: Payer: Self-pay | Admitting: Family Medicine

## 2020-10-01 NOTE — Telephone Encounter (Signed)
Patient called today to ask about her appointment on 10/02/2020 at 3:15pm. She asked if this appointment was a virtual. She was advised that is was not because she was going to have her traMADol (ULTRAM) 50 MG tablet refilled and needed to have a face to face with her provider. She understood and was ok with that. At the very end of the phone call patient advised that she has a small sore throat that just came on. After speaking to Coralee North, patient was advised that the 10/02/20 appointment would be a virtual and to only address the sore throat and another in office appointment would be scheduled to come in to office for medication refill. Patient cancelled appointment for 10/02/20 and scheduled for 10/06/20 .

## 2020-10-02 ENCOUNTER — Ambulatory Visit: Payer: PPO | Admitting: Family Medicine

## 2020-10-12 ENCOUNTER — Other Ambulatory Visit: Payer: Self-pay

## 2020-10-12 ENCOUNTER — Ambulatory Visit (INDEPENDENT_AMBULATORY_CARE_PROVIDER_SITE_OTHER): Payer: PPO | Admitting: Family Medicine

## 2020-10-12 VITALS — BP 122/76 | HR 75 | Temp 96.2°F | Resp 16 | Ht 65.5 in | Wt 227.2 lb

## 2020-10-12 DIAGNOSIS — I10 Essential (primary) hypertension: Secondary | ICD-10-CM

## 2020-10-12 DIAGNOSIS — E785 Hyperlipidemia, unspecified: Secondary | ICD-10-CM

## 2020-10-12 DIAGNOSIS — R109 Unspecified abdominal pain: Secondary | ICD-10-CM | POA: Diagnosis not present

## 2020-10-12 DIAGNOSIS — Z23 Encounter for immunization: Secondary | ICD-10-CM

## 2020-10-12 LAB — LIPID PANEL
Cholesterol: 137 mg/dL (ref 0–200)
HDL: 50.3 mg/dL (ref >39.00)
LDL Cholesterol: 65 mg/dL (ref 0–99)
NonHDL: 87
Total CHOL/HDL Ratio: 3
Triglycerides: 108 mg/dL (ref 0.0–149.0)
VLDL: 21.6 mg/dL (ref 0.0–40.0)

## 2020-10-12 LAB — COMPREHENSIVE METABOLIC PANEL
ALT: 11 U/L (ref 0–35)
AST: 16 U/L (ref 0–37)
Albumin: 3.7 g/dL (ref 3.5–5.2)
Alkaline Phosphatase: 106 U/L (ref 39–117)
BUN: 10 mg/dL (ref 6–23)
CO2: 31 mEq/L (ref 19–32)
Calcium: 9.6 mg/dL (ref 8.4–10.5)
Chloride: 103 mEq/L (ref 96–112)
Creatinine, Ser: 0.88 mg/dL (ref 0.40–1.20)
GFR: 66.44 mL/min (ref >60.00)
Glucose, Bld: 91 mg/dL (ref 70–99)
Potassium: 4.1 mEq/L (ref 3.5–5.1)
Sodium: 140 mEq/L (ref 135–145)
Total Bilirubin: 1.2 mg/dL (ref 0.2–1.2)
Total Protein: 6.3 g/dL (ref 6.0–8.3)

## 2020-10-12 NOTE — Assessment & Plan Note (Addendum)
Stable.  Very mild drowsiness at times with her medications.  She will monitor that and if it worsens or becomes a more persistent issue she will let us know.  She can continue tramadol and gabapentin at her current dosing.  Advised not to drive if she is drowsy.

## 2020-10-12 NOTE — Assessment & Plan Note (Signed)
Check lipid panel.  Continue Lipitor 20 mg once daily. 

## 2020-10-12 NOTE — Assessment & Plan Note (Signed)
Well-controlled recently.  She will continue amlodipine 5 mg once daily.  Check CMP.

## 2020-10-12 NOTE — Patient Instructions (Signed)
Nice to see you. We will contact you with your lab results. 

## 2020-10-12 NOTE — Progress Notes (Signed)
Tommi Rumps, MD Phone: [phone]  Alice Campbell is a 70 y.o. female who presents today for f/u.  HYPERTENSION  Disease Monitoring  Home BP Monitoring similar to today at ortho, not checking at home Chest pain- no    Dyspnea- no Medications  Compliance-  Taking amlodipine.  Edema- no  HYPERLIPIDEMIA Symptoms Chest pain on exertion:  no    Medications: Compliance- taking lipitor Right upper quadrant pain- no  Muscle aches- no  Chronic right flank pain: This is a chronic ongoing issue felt to be related to nerve impingement.  She notes her gabapentin and tramadol do a pretty good job controlling her discomfort though there are days where it is worse than others.  She typically takes tramadol 2 tablets in the morning and very occasionally takes 2 in the afternoon.  She does note some very mild drowsiness when she takes both medicines together so she does not drive when this occurs.    Social History   Tobacco Use  Smoking Status Former Smoker  . Quit date: 01/19/1996  . Years since quitting: 24.7  Smokeless Tobacco Never Used  Tobacco Comment   quit in 2000    Current Outpatient Medications on File Prior to Visit  Medication Sig Dispense Refill  . amLODipine (NORVASC) 5 MG tablet TAKE 1 TABLET BY MOUTH EVERY DAY 90 tablet 1  . atorvastatin (LIPITOR) 20 MG tablet TAKE 1 TABLET BY MOUTH EVERY DAY 90 tablet 0  . cephALEXin (KEFLEX) 500 MG capsule Take 1 capsule (500 mg total) by mouth 4 (four) times daily. (Patient not taking: Reported on 10/12/2020) 20 capsule 0  . gabapentin (NEURONTIN) 100 MG capsule TAKE 4 CAPSULES (400 MG) BY MOUTH IN THE MORNING AND TAKE 3 CAPSULES (300 MG) BY MOUTH IN THE EVENING. 630 capsule 1  . meloxicam (MOBIC) 7.5 MG tablet Take 7.5 mg by mouth daily.    Marland Kitchen nystatin cream (MYCOSTATIN) APPLY 1 APPLICATION TOPICALLY TWICE A DAY (*MIX WITH TRIAMCINOLONE CREAM... FOR INS. COVERAGE) (Patient not taking: Reported on 10/12/2020) 30 g 0  . traMADol (ULTRAM)  50 MG tablet TAKE 2 TABLETS BY MOUTH EVERY 8 HOURS AS NEEDED FOR PAIN 60 tablet 0  . trolamine salicylate (ASPERCREME) 10 % cream Apply 1 application topically as needed for muscle pain.     No current facility-administered medications on file prior to visit.     ROS see history of present illness  Objective  Physical Exam Vitals:   10/12/20 1128  BP: 122/76  Pulse: 75  Resp: 16  Temp: (!) 96.2 F (35.7 C)  SpO2: 99%    BP Readings from Last 3 Encounters:  10/12/20 122/76  08/20/20 (!) 162/80  03/06/20 (!) 158/83   Wt Readings from Last 3 Encounters:  10/12/20 227 lb 3.2 oz (103.1 kg)  08/27/20 225 lb (102.1 kg)  07/23/20 225 lb (102.1 kg)    Physical Exam Constitutional:      General: She is not in acute distress.    Appearance: She is not diaphoretic.  Cardiovascular:     Rate and Rhythm: Normal rate and regular rhythm.     Heart sounds: Normal heart sounds.  Pulmonary:     Effort: Pulmonary effort is normal.     Breath sounds: Normal breath sounds.  Musculoskeletal:     Right lower leg: No edema.     Left lower leg: No edema.  Skin:    General: Skin is warm and dry.  Neurological:     Mental Status:  She is alert.      Assessment/Plan: Please see individual problem list.  Problem List Items Addressed This Visit    HTN (hypertension)    Well-controlled recently.  She will continue amlodipine 5 mg once daily.  Check CMP.      Relevant Orders   Comp Met (CMET)   Hyperlipidemia    Check lipid panel.  Continue Lipitor 20 mg once daily.      Relevant Orders   Lipid panel   Right flank pain    Stable.  Very mild drowsiness at times with her medications.  She will monitor that and if it worsens or becomes a more persistent issue she will let us know.  She can continue tramadol and gabapentin at her current dosing.  Advised not to drive if she is drowsy.       Other Visit Diagnoses    Need for pneumococcal vaccination    -  Primary   Relevant  Orders   Pneumococcal conjugate vaccine 20-valent (Prevnar 20) (Completed)       Health Maintenance: Pneumonia vaccine given today.  Discussed she can get her shingles vaccine at the pharmacy.     This visit occurred during the SARS-CoV-2 public health emergency.  Safety protocols were in place, including screening questions prior to the visit, additional usage of staff PPE, and extensive cleaning of exam room while observing appropriate contact time as indicated for disinfecting solutions.    Tommi Rumps, MD [place]

## 2020-11-03 ENCOUNTER — Ambulatory Visit
Admission: RE | Admit: 2020-11-03 | Discharge: 2020-11-03 | Disposition: A | Discharge status: Home / Self Care | Payer: PPO | Source: Ambulatory Visit | Attending: Student | Admitting: Student

## 2020-11-03 DIAGNOSIS — M47817 Spondylosis without myelopathy or radiculopathy, lumbosacral region: Secondary | ICD-10-CM | POA: Diagnosis not present

## 2020-11-03 DIAGNOSIS — M5416 Radiculopathy, lumbar region: Secondary | ICD-10-CM

## 2020-11-03 MED ORDER — METHYLPREDNISOLONE ACETATE 40 MG/ML INJ SUSP (RADIOLOG
80.0000 mg | Freq: Once | INTRAMUSCULAR | Status: AC
  Administered 2020-11-03: 80 mg via EPIDURAL

## 2020-11-03 MED ORDER — IOPAMIDOL (ISOVUE-M 200) INJECTION 41%
1.0000 mL | Freq: Once | INTRAMUSCULAR | Status: AC
  Administered 2020-11-03: 1 mL via EPIDURAL

## 2020-11-03 NOTE — Discharge Instructions (Signed)

## 2020-11-04 ENCOUNTER — Other Ambulatory Visit: Payer: PPO

## 2020-11-09 ENCOUNTER — Other Ambulatory Visit: Payer: PPO

## 2020-12-17 ENCOUNTER — Other Ambulatory Visit: Payer: Self-pay | Admitting: Family Medicine

## 2020-12-22 ENCOUNTER — Other Ambulatory Visit: Payer: Self-pay | Admitting: Family Medicine

## 2020-12-29 ENCOUNTER — Other Ambulatory Visit: Payer: Self-pay | Admitting: Family Medicine

## 2021-01-14 ENCOUNTER — Other Ambulatory Visit: Payer: Self-pay | Admitting: Student

## 2021-01-14 DIAGNOSIS — M5416 Radiculopathy, lumbar region: Secondary | ICD-10-CM

## 2021-01-19 ENCOUNTER — Other Ambulatory Visit: Payer: Self-pay

## 2021-01-19 ENCOUNTER — Ambulatory Visit
Admission: RE | Admit: 2021-01-19 | Discharge: 2021-01-19 | Disposition: A | Discharge status: Home / Self Care | Payer: PPO | Source: Ambulatory Visit | Attending: Student | Admitting: Student

## 2021-01-19 DIAGNOSIS — M47817 Spondylosis without myelopathy or radiculopathy, lumbosacral region: Secondary | ICD-10-CM | POA: Diagnosis not present

## 2021-01-19 DIAGNOSIS — M5416 Radiculopathy, lumbar region: Secondary | ICD-10-CM

## 2021-01-19 MED ORDER — METHYLPREDNISOLONE ACETATE 40 MG/ML INJ SUSP (RADIOLOG
80.0000 mg | Freq: Once | INTRAMUSCULAR | Status: AC
  Administered 2021-01-19: 80 mg via EPIDURAL

## 2021-01-19 MED ORDER — IOPAMIDOL (ISOVUE-M 200) INJECTION 41%
1.0000 mL | Freq: Once | INTRAMUSCULAR | Status: AC
  Administered 2021-01-19: 1 mL via EPIDURAL

## 2021-01-19 NOTE — Discharge Instructions (Signed)

## 2021-02-10 ENCOUNTER — Other Ambulatory Visit: Payer: Self-pay | Admitting: Family Medicine

## 2021-03-07 ENCOUNTER — Other Ambulatory Visit: Payer: Self-pay | Admitting: Family Medicine

## 2021-03-21 ENCOUNTER — Other Ambulatory Visit: Payer: Self-pay | Admitting: Family Medicine

## 2021-03-23 ENCOUNTER — Other Ambulatory Visit: Payer: Self-pay | Admitting: Family Medicine

## 2021-04-13 ENCOUNTER — Telehealth: Payer: Self-pay | Admitting: Family Medicine

## 2021-04-13 ENCOUNTER — Ambulatory Visit: Payer: PPO | Admitting: Family Medicine

## 2021-04-13 NOTE — Telephone Encounter (Signed)
Patient called in stating that she missed her appointment this morning because she did not know if it was virtual or in office because of the recording she received.Informed patient it was an in office visit and offered to reschedule,patient declined.Patient is unsure if she will need to come in office or virtual for her next visit.Please advise.

## 2021-04-13 NOTE — Telephone Encounter (Signed)
I called the patient and she is scheduled for a in office visit with the provider.  Kathelene Rumberger,cma

## 2021-04-28 ENCOUNTER — Encounter: Payer: Self-pay | Admitting: Family Medicine

## 2021-04-28 ENCOUNTER — Ambulatory Visit (INDEPENDENT_AMBULATORY_CARE_PROVIDER_SITE_OTHER): Payer: PPO | Admitting: Family Medicine

## 2021-04-28 ENCOUNTER — Other Ambulatory Visit: Payer: Self-pay

## 2021-04-28 DIAGNOSIS — R109 Unspecified abdominal pain: Secondary | ICD-10-CM | POA: Diagnosis not present

## 2021-04-28 DIAGNOSIS — E785 Hyperlipidemia, unspecified: Secondary | ICD-10-CM

## 2021-04-28 DIAGNOSIS — I10 Essential (primary) hypertension: Secondary | ICD-10-CM | POA: Diagnosis not present

## 2021-04-28 NOTE — Assessment & Plan Note (Signed)
Previously well controlled.  She will continue Lipitor 20 mg once daily.  We will recheck labs in 6 months.

## 2021-04-28 NOTE — Patient Instructions (Signed)
Nice to see you!   

## 2021-04-28 NOTE — Progress Notes (Signed)
Marikay Alar, MD Phone: [phone]  Alice Campbell is a 70 y.o. female who presents today for f/u.  HYPERTENSION Disease Monitoring Home BP Monitoring not checking Chest pain- no    Dyspnea- no Medications Compliance-  taking amlodipine  Edema- no BMET    Component Value Date/Time   NA 140 10/12/2020 1155   K 4.1 10/12/2020 1155   CL 103 10/12/2020 1155   CO2 31 10/12/2020 1155   GLUCOSE 91 10/12/2020 1155   BUN 10 10/12/2020 1155   CREATININE 0.88 10/12/2020 1155   CREATININE 0.93 10/25/2013 1547   CALCIUM 9.6 10/12/2020 1155   GFRNONAA >60 02/05/2019 1028   GFRAA >60 02/05/2019 1028   HYPERLIPIDEMIA Symptoms Chest pain on exertion:  no   Leg claudication:   no Medications: Compliance- taking lipitor Right upper quadrant pain- no  Muscle aches- no Lipid Panel     Component Value Date/Time   CHOL 137 10/12/2020 1155   TRIG 108.0 10/12/2020 1155   HDL 50.30 10/12/2020 1155   CHOLHDL 3 10/12/2020 1155   VLDL 21.6 10/12/2020 1155   LDLCALC 65 10/12/2020 1155   Chronic flank pain: This is an intermittent issue.  It is well controlled at this time.  She takes gabapentin.  She occasionally takes the tramadol if she is having caroxazone's of pain.  She does get a little drowsy with these medicines that she does not drive when taking the tramadol.  Social History   Tobacco Use  Smoking Status Former   Types: Cigarettes   Quit date: 01/19/1996   Years since quitting: 25.2  Smokeless Tobacco Never  Tobacco Comments   quit in 2000    Current Outpatient Medications on File Prior to Visit  Medication Sig Dispense Refill   amLODipine (NORVASC) 5 MG tablet TAKE 1 TABLET BY MOUTH EVERY DAY 90 tablet 1   atorvastatin (LIPITOR) 20 MG tablet TAKE 1 TABLET BY MOUTH EVERY DAY 90 tablet 0   gabapentin (NEURONTIN) 100 MG capsule TAKE 4 CAPSULES (400 MG) BY MOUTH IN THE MORNING AND TAKE 3 CAPSULES BY MOUTH IN THE EVENING. 630 capsule 1   meloxicam (MOBIC) 7.5 MG tablet Take  7.5 mg by mouth daily.     traMADol (ULTRAM) 50 MG tablet Take 2 tablets (100 mg total) by mouth every 8 (eight) hours as needed. for pain 60 tablet 0   trolamine salicylate (ASPERCREME) 10 % cream Apply 1 application topically as needed for muscle pain.     No current facility-administered medications on file prior to visit.     ROS see history of present illness  Objective  Physical Exam Vitals:   04/28/21 0906  BP: 120/80  Pulse: 69  Temp: 98.6 F (37 C)  SpO2: 95%    BP Readings from Last 3 Encounters:  04/28/21 120/80  01/19/21 (!) 175/82  11/03/20 (!) 145/84   Wt Readings from Last 3 Encounters:  04/28/21 226 lb 9.6 oz (102.8 kg)  10/12/20 227 lb 3.2 oz (103.1 kg)  08/27/20 225 lb (102.1 kg)    Physical Exam Constitutional:      General: She is not in acute distress.    Appearance: She is not diaphoretic.  Cardiovascular:     Rate and Rhythm: Normal rate and regular rhythm.     Heart sounds: Normal heart sounds.  Pulmonary:     Effort: Pulmonary effort is normal.     Breath sounds: Normal breath sounds.  Musculoskeletal:     Right lower leg: No edema.  Left lower leg: No edema.  Skin:    General: Skin is warm and dry.  Neurological:     Mental Status: She is alert.     Assessment/Plan: Please see individual problem list.  Problem List Items Addressed This Visit     HTN (hypertension)    Well-controlled.  She will continue amlodipine 5 mg once daily.  Plan for labs at follow-up in 6 months.      Hyperlipidemia    Previously well controlled.  She will continue Lipitor 20 mg once daily.  We will recheck labs in 6 months.      Right flank pain    This is a chronic intermittent issue.  It is well controlled.  She will continue gabapentin 400 mg in the morning and 300 mg in the evening.  She will continue tramadol 2 tablets by mouth every 8 hours as needed for the pain.  Advised to not drive if she is drowsy with these medications.        Return in about 6 months (around 10/26/2021) for Hypertension/hyperlipidemia.  This visit occurred during the SARS-CoV-2 public health emergency.  Safety protocols were in place, including screening questions prior to the visit, additional usage of staff PPE, and extensive cleaning of exam room while observing appropriate contact time as indicated for disinfecting solutions.    Marikay Alar, MD [facility]

## 2021-04-28 NOTE — Assessment & Plan Note (Signed)
Well-controlled.  She will continue amlodipine 5 mg once daily.  Plan for labs at follow-up in 6 months.

## 2021-04-28 NOTE — Assessment & Plan Note (Signed)
This is a chronic intermittent issue.  It is well controlled.  She will continue gabapentin 400 mg in the morning and 300 mg in the evening.  She will continue tramadol 2 tablets by mouth every 8 hours as needed for the pain.  Advised to not drive if she is drowsy with these medications.

## 2021-05-31 ENCOUNTER — Other Ambulatory Visit: Payer: Self-pay | Admitting: Family Medicine

## 2021-05-31 NOTE — Telephone Encounter (Signed)
RX Refill: tramadol Last Seen: 04-28-21 Last Ordered: 03-24-21 Next Appt: 10-27-21

## 2021-06-23 ENCOUNTER — Other Ambulatory Visit: Payer: Self-pay | Admitting: Family Medicine

## 2021-06-24 ENCOUNTER — Other Ambulatory Visit: Payer: Self-pay | Admitting: Family Medicine

## 2021-08-24 ENCOUNTER — Other Ambulatory Visit: Payer: Self-pay | Admitting: Family Medicine

## 2021-08-30 ENCOUNTER — Ambulatory Visit (INDEPENDENT_AMBULATORY_CARE_PROVIDER_SITE_OTHER): Payer: PPO

## 2021-08-30 VITALS — Ht 65.0 in | Wt 226.0 lb

## 2021-08-30 DIAGNOSIS — Z0000 Encounter for general adult medical examination without abnormal findings: Secondary | ICD-10-CM | POA: Diagnosis not present

## 2021-08-30 NOTE — Progress Notes (Signed)
Subjective:   Alice Campbell is a 71 y.o. female who presents for Medicare Annual (Subsequent) preventive examination.  Review of Systems    No ROS.  Medicare Wellness Virtual Visit.  Visual/audio telehealth visit, UTA vital signs.   See social history for additional risk factors.   Cardiac Risk Factors include: advanced age (>72men, >24 women)     Objective:    Today's Vitals   08/30/21 1319  Weight: 226 lb (102.5 kg)  Height: 5\' 5"  (1.651 m)   Body mass index is 37.61 kg/m.  Advanced Directives 08/30/2021 08/27/2020 07/23/2019 02/11/2019 02/05/2019 07/18/2018 05/24/2016  Does Patient Have a Medical Advance Directive? No No No No No No No  Would patient like information on creating a medical advance directive? No - Patient declined No - Patient declined No - Patient declined No - Patient declined No - Patient declined No - Patient declined (No Data)   Current Medications (verified) Outpatient Encounter Medications as of 08/30/2021  Medication Sig   amLODipine (NORVASC) 5 MG tablet TAKE 1 TABLET BY MOUTH EVERY DAY   atorvastatin (LIPITOR) 20 MG tablet TAKE 1 TABLET BY MOUTH EVERY DAY   gabapentin (NEURONTIN) 100 MG capsule TAKE 4 CAPSULES (400 MG) BY MOUTH IN THE MORNING AND TAKE 3 CAPSULES BY MOUTH IN THE EVENING.   meloxicam (MOBIC) 7.5 MG tablet Take 7.5 mg by mouth daily.   traMADol (ULTRAM) 50 MG tablet TAKE 2 TABLETS (100 MG TOTAL) BY MOUTH EVERY 8 (EIGHT) HOURS AS NEEDED. FOR PAIN   trolamine salicylate (ASPERCREME) 10 % cream Apply 1 application topically as needed for muscle pain.   No facility-administered encounter medications on file as of 08/30/2021.   Allergies (verified) Patient has no known allergies.   History: Past Medical History:  Diagnosis Date   Arthritis    Bulging lumbar disc    Chicken pox    GERD (gastroesophageal reflux disease)    History of UTI    [facility]   Hyperlipidemia    Hypertension    Psoriasis    Past Surgical History:   Procedure Laterality Date   BIOPSY THYROID  07/2013   CHOLECYSTECTOMY  2011   FRACTURE SURGERY Right    Hip   KNEE ARTHROPLASTY Left 02/11/2019   Procedure: COMPUTER ASSISTED TOTAL KNEE ARTHROPLASTY - RNFA;  Surgeon: Dereck Leep, MD;  Location: [facility] ORS;  Service: Orthopedics;  Laterality: Left;   Family History  Problem Relation Age of Onset   Heart disease Mother    Hypertension Mother    Mental illness Mother        Nervous breakdown    Cancer Father        Prostate Cancer   Heart disease Father    Hypertension Father    Diabetes Son    Breast cancer Neg Hx    Social History   Socioeconomic History   Marital status: Married    Spouse name: Not on file   Number of children: Not on file   Years of education: 12   Highest education level: Not on file  Occupational History   Occupation: Public Work    Comment: Semi-retired  Tobacco Use   Smoking status: Former    Types: Cigarettes    Quit date: 01/19/1996    Years since quitting: 25.6   Smokeless tobacco: Never   Tobacco comments:    quit in 2000  Vaping Use   Vaping Use: Never used  Substance and Sexual Activity   Alcohol use:  Yes    Comment: 1-2 drinks per month   Drug use: No   Sexual activity: Yes  Other Topics Concern   Not on file  Social History Narrative   Mrs. Buss grew up in [place], [place]. She is currently living in [place] with her husband. She enjoys swimming and reading.   Social Determinants of Health   Financial Resource Strain: Low Risk    Difficulty of Paying Living Expenses: Not hard at all  Food Insecurity: No Food Insecurity   Worried About Charity fundraiser in the Last Year: Never true   [place] in the Last Year: Never true  Transportation Needs: No Transportation Needs   Lack of Transportation (Medical): No   Lack of Transportation (Non-Medical): No  Physical Activity: Unknown   Days of Exercise per Week: 0 days   Minutes of Exercise per Session: Not on file   Stress: No Stress Concern Present   Feeling of Stress : Not at all  Social Connections: Unknown   Frequency of Communication with Friends and Family: More than three times a week   Frequency of Social Gatherings with Friends and Family: More than three times a week   Attends Religious Services: Not on Electrical engineer or Organizations: Not on file   Attends Archivist Meetings: Not on file   Marital Status: Not on file   Tobacco Counseling Counseling given: Not Answered Tobacco comments: quit in 2000  Clinical Intake:  Pre-visit preparation completed: Yes        Diabetes: No  How often do you need to have someone help you when you read instructions, pamphlets, or other written materials from your doctor or pharmacy?: 1 - Never Interpreter Needed?: No     Activities of Daily Living In your present state of health, do you have any difficulty performing the following activities: 08/30/2021  Hearing? Y  Comment Hearing aids  Vision? N  Difficulty concentrating or making decisions? N  Walking or climbing stairs? Y  Comment Unsteady gait. Cane in use.  Dressing or bathing? N  Doing errands, shopping? N  Preparing Food and eating ? N  Using the Toilet? N  In the past six months, have you accidently leaked urine? N  Do you have problems with loss of bowel control? N  Managing your Medications? N  Managing your Finances? N  Housekeeping or managing your Housekeeping? Y  Comment Husband assist  Some recent data might be hidden   Patient Care Team: Leone Haven, MD as PCP - General (Family Medicine)  Indicate any recent Medical Services you may have received from other than [facility] providers in the past year (date may be approximate).     Assessment:   This is a routine wellness examination for Alice Campbell.  Virtual Visit via Telephone Note  I connected with  KAILYNE WOOLFORK on 08/30/21 at  1:15 PM EST by telephone and verified that I am speaking  with the correct person using two identifiers.  Persons participating in the virtual visit: patient/Nurse Health Advisor   I discussed the limitations, risks, security and privacy concerns of performing an evaluation and management service by telephone and the availability of in person appointments. The patient expressed understanding and agreed to proceed.  Interactive audio and video telecommunications were attempted between this nurse and patient, however failed, due to patient having technical difficulties OR patient did not have access to video capability.  We continued and completed  visit with audio only.  Some vital signs may be absent or patient reported.   Hearing/Vision screen Hearing Screening - Comments:: Hearing aids  Vision Screening - Comments:: Wears corrective lenses when reading.  Dietary issues and exercise activities discussed: Current Exercise Habits: The patient does not participate in regular exercise at present [place] water intake   Goals Addressed             This Visit's Progress    Maintain Healthy Lifestyle       Stay active Low carb diet Stay hydrated       Depression Screen PHQ 2/9 Scores 08/30/2021 08/27/2020 07/23/2020 03/16/2020 07/23/2019 06/11/2019 07/18/2018  PHQ - 2 Score 0 0 0 0 0 0 0    Fall Risk Fall Risk  08/30/2021 08/27/2020 07/23/2020 03/16/2020 07/23/2019  Falls in the past year? 0 0 0 0 0  Number falls in past yr: - 0 0 0 -  Comment - - - - -  Injury with Fall? - 0 0 - -  Risk Factor Category  - - - - -  Risk for fall due to : - - - - -  Follow up Falls evaluation completed Falls evaluation completed Falls evaluation completed Falls evaluation completed Falls evaluation completed   [place]: Home free of loose throw rugs in walkways, pet beds, electrical cords, etc? Yes  Adequate lighting in your home to reduce risk of falls? Yes   ASSISTIVE DEVICES UTILIZED TO PREVENT FALLS: Life alert?  No  Use of a cane, walker or w/c? Yes  Grab bars in the bathroom? Yes  Shower chair or bench in shower? Yes  Elevated toilet seat or a handicapped toilet? Yes   TIMED UP AND GO: Was the test performed? No .   Cognitive Function: Patient is alert and oriented x3.  Enjoys playing solitaire and other brain health stimulating activities.   MMSE - Mini Mental State Exam 07/18/2018  Orientation to time 5  Orientation to Place 5  Registration 3  Attention/ Calculation 5  Recall 3  Language- name 2 objects 2  Language- repeat 1  Language- follow 3 step command 3  Language- read & follow direction 1  Write a sentence 1  Copy design 1  Total score 30     6CIT Screen 08/30/2021 07/23/2019  What Year? 0 points 0 points  What month? 0 points 0 points  What time? 0 points 0 points  Count back from 20 0 points 0 points  Months in reverse 0 points 0 points  Repeat phrase 0 points 0 points  Total Score 0 0   Immunizations Immunization History  Administered Date(s) Administered   Fluad Quad(high Dose 65+) 03/20/2019   Influenza, High Dose Seasonal PF 04/17/2017, 04/15/2018, 03/20/2020, 04/20/2021   Influenza,inj,Quad PF,6+ Mos 04/24/2017   PFIZER(Purple Top)SARS-COV-2 Vaccination 08/08/2019, 08/29/2019, 03/27/2020   PNEUMOCOCCAL CONJUGATE-20 10/12/2020   Pneumococcal Polysaccharide-23 07/03/2018   Td 12/09/2013   Zoster Recombinat (Shingrix) 12/24/2020, 04/20/2021   Zoster, Live 12/24/2013   Dexa Scan- declined.   Covid-19 vaccine status: Completed vaccines x3.   Screening Tests Health Maintenance  Topic Date Due   COVID-19 Vaccine (4 - Booster for Pfizer series) 09/15/2021 (Originally 05/22/2020)   DEXA SCAN  12/09/2021 (Originally 01/20/2015)   MAMMOGRAM  07/20/2022   TETANUS/TDAP  12/10/2023   COLONOSCOPY (Pts 45-28yrs Insurance coverage will need to be confirmed)  03/21/2024   Pneumonia Vaccine 27+ Years old  Completed  INFLUENZA VACCINE  Completed   Hepatitis C  Screening  Completed   Zoster Vaccines- Shingrix  Completed   HPV VACCINES  Aged Out   Health Maintenance There are no preventive care reminders to display for this patient.  Lung Cancer Screening: does not qualify.   Vision Screening: Recommended annual ophthalmology exams for early detection of glaucoma and other disorders of the eye.  Dental Screening: Recommended annual dental exams for proper oral hygiene  Community Resource Referral / Chronic Care Management: CRR required this visit?  No   CCM required this visit?  No      Plan:   Keep all routine maintenance appointments.   I have personally reviewed and noted the following in the patients chart:   Medical and social history Use of alcohol, tobacco or illicit drugs  Current medications and supplements including opioid prescriptions. Taking Tramadol. Followed by PCP.  Functional ability and status Nutritional status Physical activity Advanced directives List of other physicians Hospitalizations, surgeries, and ER visits in previous 12 months Vitals Screenings to include cognitive, depression, and falls Referrals and appointments  In addition, I have reviewed and discussed with patient certain preventive protocols, quality metrics, and best practice recommendations. A written personalized care plan for preventive services as well as general preventive health recommendations were provided to patient.     Varney Biles, LPN   [phone]

## 2021-08-30 NOTE — Patient Instructions (Addendum)
Alice Campbell , Thank you for taking time to come for your Medicare Wellness Visit. I appreciate your ongoing commitment to your health goals. Please review the following plan we discussed and let me know if I can assist you in the future.   These are the goals we discussed:  Goals      Maintain Healthy Lifestyle     Stay active Low carb diet Stay hydrated        This is a list of the screening recommended for you and due dates:  Health Maintenance  Topic Date Due   COVID-19 Vaccine (4 - Booster for Pfizer series) 09/15/2021*   DEXA scan (bone density measurement)  12/09/2021*   Mammogram  07/20/2022   Tetanus Vaccine  12/10/2023   Colon Cancer Screening  03/21/2024   Pneumonia Vaccine  Completed   Flu Shot  Completed   Hepatitis C Screening: USPSTF Recommendation to screen - Ages 18-79 yo.  Completed   Zoster (Shingles) Vaccine  Completed   HPV Vaccine  Aged Out  *Topic was postponed. The date shown is not the original due date.    Advanced directives: not yet completed.  Conditions/risks identified: none new.  Follow up in one year for your annual wellness visit    Preventive Care 65 Years and Older, Female Preventive care refers to lifestyle choices and visits with your health care provider that can promote health and wellness. What does preventive care include? A yearly physical exam. This is also called an annual well check. Dental exams once or twice a year. Routine eye exams. Ask your health care provider how often you should have your eyes checked. Personal lifestyle choices, including: Daily care of your teeth and gums. Regular physical activity. Eating a healthy diet. Avoiding tobacco and drug use. Limiting alcohol use. Practicing safe sex. Taking low-dose aspirin every day. Taking vitamin and mineral supplements as recommended by your health care provider. What happens during an annual well check? The services and screenings done by your health care  provider during your annual well check will depend on your age, overall health, lifestyle risk factors, and family history of disease. Counseling  Your health care provider may ask you questions about your: Alcohol use. Tobacco use. Drug use. Emotional well-being. Home and relationship well-being. Sexual activity. Eating habits. History of falls. Memory and ability to understand (cognition). Work and work Astronomer. Reproductive health. Screening  You may have the following tests or measurements: Height, weight, and BMI. Blood pressure. Lipid and cholesterol levels. These may be checked every 5 years, or more frequently if you are over 69 years old. Skin check. Lung cancer screening. You may have this screening every year starting at age 84 if you have a 30-pack-year history of smoking and currently smoke or have quit within the past 15 years. Fecal occult blood test (FOBT) of the stool. You may have this test every year starting at age 70. Flexible sigmoidoscopy or colonoscopy. You may have a sigmoidoscopy every 5 years or a colonoscopy every 10 years starting at age 8. Hepatitis C blood test. Hepatitis B blood test. Sexually transmitted disease (STD) testing. Diabetes screening. This is done by checking your blood sugar (glucose) after you have not eaten for a while (fasting). You may have this done every 1-3 years. Bone density scan. This is done to screen for osteoporosis. You may have this done starting at age 78. Mammogram. This may be done every 1-2 years. Talk to your health care provider about how  often you should have regular mammograms. Talk with your health care provider about your test results, treatment options, and if necessary, the need for more tests. Vaccines  Your health care provider may recommend certain vaccines, such as: Influenza vaccine. This is recommended every year. Tetanus, diphtheria, and acellular pertussis (Tdap, Td) vaccine. You may need a Td  booster every 10 years. Zoster vaccine. You may need this after age 19. Pneumococcal 13-valent conjugate (PCV13) vaccine. One dose is recommended after age 35. Pneumococcal polysaccharide (PPSV23) vaccine. One dose is recommended after age 32. Talk to your health care provider about which screenings and vaccines you need and how often you need them. This information is not intended to replace advice given to you by your health care provider. Make sure you discuss any questions you have with your health care provider. Document Released: 07/10/2015 Document Revised: 03/02/2016 Document Reviewed: 04/14/2015 Elsevier Interactive Patient Education  2017 [place] Prevention in the Home Falls can cause injuries. They can happen to people of all ages. There are many things you can do to make your home safe and to help prevent falls. What can I do on the outside of my home? Regularly fix the edges of walkways and driveways and fix any cracks. Remove anything that might make you trip as you walk through a door, such as a raised step or threshold. Trim any bushes or trees on the path to your home. Use bright outdoor lighting. Clear any walking paths of anything that might make someone trip, such as rocks or tools. Regularly check to see if handrails are loose or broken. Make sure that both sides of any steps have handrails. Any raised decks and porches should have guardrails on the edges. Have any leaves, snow, or ice cleared regularly. Use sand or salt on walking paths during winter. Clean up any spills in your garage right away. This includes oil or grease spills. What can I do in the bathroom? Use night lights. Install grab bars by the toilet and in the tub and shower. Do not use towel bars as grab bars. Use non-skid mats or decals in the tub or shower. If you need to sit down in the shower, use a plastic, non-slip stool. Keep the floor dry. Clean up any water that spills on the floor  as soon as it happens. Remove soap buildup in the tub or shower regularly. Attach bath mats securely with double-sided non-slip rug tape. Do not have throw rugs and other things on the floor that can make you trip. What can I do in the bedroom? Use night lights. Make sure that you have a light by your bed that is easy to reach. Do not use any sheets or blankets that are too big for your bed. They should not hang down onto the floor. Have a firm chair that has side arms. You can use this for support while you get dressed. Do not have throw rugs and other things on the floor that can make you trip. What can I do in the kitchen? Clean up any spills right away. Avoid walking on wet floors. Keep items that you use a lot in easy-to-reach places. If you need to reach something above you, use a strong step stool that has a grab bar. Keep electrical cords out of the way. Do not use floor polish or wax that makes floors slippery. If you must use wax, use non-skid floor wax. Do not have throw rugs and other things  on the floor that can make you trip. What can I do with my stairs? Do not leave any items on the stairs. Make sure that there are handrails on both sides of the stairs and use them. Fix handrails that are broken or loose. Make sure that handrails are as long as the stairways. Check any carpeting to make sure that it is firmly attached to the stairs. Fix any carpet that is loose or worn. Avoid having throw rugs at the top or bottom of the stairs. If you do have throw rugs, attach them to the floor with carpet tape. Make sure that you have a light switch at the top of the stairs and the bottom of the stairs. If you do not have them, ask someone to add them for you. What else can I do to help prevent falls? Wear shoes that: Do not have high heels. Have rubber bottoms. Are comfortable and fit you well. Are closed at the toe. Do not wear sandals. If you use a stepladder: Make sure that it is  fully opened. Do not climb a closed stepladder. Make sure that both sides of the stepladder are locked into place. Ask someone to hold it for you, if possible. Clearly mark and make sure that you can see: Any grab bars or handrails. First and last steps. Where the edge of each step is. Use tools that help you move around (mobility aids) if they are needed. These include: Canes. Walkers. Scooters. Crutches. Turn on the lights when you go into a dark area. Replace any light bulbs as soon as they burn out. Set up your furniture so you have a clear path. Avoid moving your furniture around. If any of your floors are uneven, fix them. If there are any pets around you, be aware of where they are. Review your medicines with your doctor. Some medicines can make you feel dizzy. This can increase your chance of falling. Ask your doctor what other things that you can do to help prevent falls. This information is not intended to replace advice given to you by your health care provider. Make sure you discuss any questions you have with your health care provider. Document Released: 04/09/2009 Document Revised: 11/19/2015 Document Reviewed: 07/18/2014 Elsevier Interactive Patient Education  2017 [employer]  Opioid Pain Medicine Management Opioids are powerful medicines that are used to treat moderate to severe pain. When used for short periods of time, they can help you to: Sleep better. Do better in physical or occupational therapy. Feel better in the first few days after an injury. Recover from surgery. Opioids should be taken with the supervision of a trained health care provider. They should be taken for the shortest period of time possible. This is because opioids can be addictive, and the longer you take opioids, the greater your risk of addiction. This addiction can also be called opioid use disorder. What are the risks? Using opioid pain medicines for longer than 3 days increases your risk  of side effects. Side effects include: Constipation. Nausea and vomiting. Breathing difficulties (respiratory depression). Drowsiness. Confusion. Opioid use disorder. Itching. Taking opioid pain medicine for a long period of time can affect your ability to do daily tasks. It also puts you at risk for: Motor vehicle crashes. Depression. Suicide. Heart attack. Overdose, which can be life-threatening. What is a pain treatment plan? A pain treatment plan is an agreement between you and your health care provider. Pain is unique to each person, and treatments vary  depending on your condition. To manage your pain, you and your health care provider need to work together. To help you do this: Discuss the goals of your treatment, including how much pain you might expect to have and how you will manage the pain. Review the risks and benefits of taking opioid medicines. Remember that a good treatment plan uses more than one approach and minimizes the chance of side effects. Be honest about the amount of medicines you take and about any drug or alcohol use. Get pain medicine prescriptions from only one health care provider. Pain can be managed with many types of alternative treatments. Ask your health care provider to refer you to one or more specialists who can help you manage pain through: Physical or occupational therapy. Counseling (cognitive behavioral therapy). Good nutrition. Biofeedback. Massage. Meditation. Non-opioid medicine. Following a gentle exercise program. How to use opioid pain medicine Taking medicine Take your pain medicine exactly as told by your health care provider. Take it only when you need it. If your pain gets less severe, you may take less than your prescribed dose if your health care provider approves. If you are not having pain, do nottake pain medicine unless your health care provider tells you to take it. If your pain is severe, do nottry to treat it yourself by  taking more pills than instructed on your prescription. Contact your health care provider for help. Write down the times when you take your pain medicine. It is easy to become confused while on pain medicine. Writing the time can help you avoid overdose. Take other over-the-counter or prescription medicines only as told by your health care provider. Keeping yourself and others safe  While you are taking opioid pain medicine: Do not drive, use machinery, or power tools. Do not sign legal documents. Do not drink alcohol. Do not take sleeping pills. Do not supervise children by yourself. Do not do activities that require climbing or being in high places. Do not go to a lake, river, ocean, spa, or swimming pool. Do not share your pain medicine with anyone. Keep pain medicine in a locked cabinet or in a secure area where pets and children cannot reach it. Stopping your use of opioids If you have been taking opioid medicine for more than a few weeks, you may need to slowly decrease (taper) how much you take until you stop completely. Tapering your use of opioids can decrease your risk of symptoms of withdrawal, such as: Pain and cramping in the abdomen. Nausea. Sweating. Sleepiness. Restlessness. Uncontrollable shaking (tremors). Cravings for the medicine. Do not attempt to taper your use of opioids on your own. Talk with your health care provider about how to do this. Your health care provider may prescribe a step-down schedule based on how much medicine you are taking and how long you have been taking it. Getting rid of leftover pills Do not save any leftover pills. Get rid of leftover pills safely by: Taking the medicine to a prescription take-back program. This is usually offered by the county or law enforcement. Bringing them to a pharmacy that has a drug disposal container. Flushing them down the toilet. Check the label or package insert of your medicine to see whether this is safe to  do. Throwing them out in the trash. Check the label or package insert of your medicine to see whether this is safe to do. If it is safe to throw it out, remove the medicine from the original container, put it  into a sealable bag or container, and mix it with used coffee grounds, food scraps, dirt, or cat litter before putting it in the trash. Follow these instructions at home: Activity Do exercises as told by your health care provider. Avoid activities that make your pain worse. Return to your normal activities as told by your health care provider. Ask your health care provider what activities are safe for you. General instructions You may need to take these actions to prevent or treat constipation: Drink enough fluid to keep your urine pale yellow. Take over-the-counter or prescription medicines. Eat foods that are high in fiber, such as beans, whole grains, and fresh fruits and vegetables. Limit foods that are high in fat and processed sugars, such as fried or sweet foods. Keep all follow-up visits. This is important. Where to find support If you have been taking opioids for a long time, you may benefit from receiving support for quitting from a local support group or counselor. Ask your health care provider for a referral to these resources in your area. Where to find more information Centers for Disease Control and Prevention (CDC): FootballExhibition.com.br [place] Food and Drug Administration (FDA): PumpkinSearch.com.ee Get help right away if: You may have taken too much of an opioid (overdosed). Common symptoms of an overdose: Your breathing is slower or more shallow than normal. You have a very slow heartbeat (pulse). You have slurred speech. You have nausea and vomiting. Your pupils become very small. You have other potential symptoms: You are very confused. You faint or feel like you will faint. You have cold, clammy skin. You have blue lips or fingernails. You have thoughts of harming yourself or  harming others. These symptoms may represent a serious problem that is an emergency. Do not wait to see if the symptoms will go away. Get medical help right away. Call your local emergency services (911 in the [place] Do not drive yourself to the hospital.  If you ever feel like you may hurt yourself or others, or have thoughts about taking your own life, get help right away. Go to your nearest emergency department or: Call your local emergency services (911 in the [place] Call the [facility] ((301)692-5105 in the [place] Call a suicide crisis helpline, such as the National Suicide Prevention Lifeline at 724-051-4673 or 988 in the [place] This is open 24 hours a day in the [place] Text the Crisis Text Line at (706) 143-1716 (in the [place] Summary Opioid medicines can help you manage moderate to severe pain for a short period of time. A pain treatment plan is an agreement between you and your health care provider. Discuss the goals of your treatment, including how much pain you might expect to have and how you will manage the pain. If you think that you or someone else may have taken too much of an opioid, get medical help right away. This information is not intended to replace advice given to you by your health care provider. Make sure you discuss any questions you have with your health care provider. Document Revised: 01/06/2021 Document Reviewed: 09/23/2020 Elsevier Patient Education  2022 [employer].

## 2021-09-28 ENCOUNTER — Other Ambulatory Visit: Payer: Self-pay | Admitting: Family Medicine

## 2021-10-27 ENCOUNTER — Ambulatory Visit (INDEPENDENT_AMBULATORY_CARE_PROVIDER_SITE_OTHER): Payer: PPO | Admitting: Family Medicine

## 2021-10-27 ENCOUNTER — Encounter: Payer: Self-pay | Admitting: Family Medicine

## 2021-10-27 VITALS — BP 122/80 | HR 77 | Temp 98.4°F | Ht 65.0 in | Wt 218.6 lb

## 2021-10-27 DIAGNOSIS — I10 Essential (primary) hypertension: Secondary | ICD-10-CM

## 2021-10-27 DIAGNOSIS — E785 Hyperlipidemia, unspecified: Secondary | ICD-10-CM

## 2021-10-27 DIAGNOSIS — R109 Unspecified abdominal pain: Secondary | ICD-10-CM | POA: Diagnosis not present

## 2021-10-27 DIAGNOSIS — R195 Other fecal abnormalities: Secondary | ICD-10-CM | POA: Diagnosis not present

## 2021-10-27 DIAGNOSIS — Z6836 Body mass index (BMI) 36.0-36.9, adult: Secondary | ICD-10-CM

## 2021-10-27 LAB — LIPID PANEL
Cholesterol: 146 mg/dL (ref 0–200)
HDL: 49.2 mg/dL (ref >39.00)
LDL Cholesterol: 68 mg/dL (ref 0–99)
NonHDL: 96.6
Total CHOL/HDL Ratio: 3
Triglycerides: 141 mg/dL (ref 0.0–149.0)
VLDL: 28.2 mg/dL (ref 0.0–40.0)

## 2021-10-27 LAB — HEMOGLOBIN A1C: Hgb A1c MFr Bld: 5.5 % (ref 4.6–6.5)

## 2021-10-27 LAB — COMPREHENSIVE METABOLIC PANEL
ALT: 8 U/L (ref 0–35)
AST: 14 U/L (ref 0–37)
Albumin: 3.8 g/dL (ref 3.5–5.2)
Alkaline Phosphatase: 109 U/L (ref 39–117)
BUN: 11 mg/dL (ref 6–23)
CO2: 29 mEq/L (ref 19–32)
Calcium: 9 mg/dL (ref 8.4–10.5)
Chloride: 104 mEq/L (ref 96–112)
Creatinine, Ser: 0.82 mg/dL (ref 0.40–1.20)
GFR: 71.79 mL/min (ref >60.00)
Glucose, Bld: 88 mg/dL (ref 70–99)
Potassium: 4.2 mEq/L (ref 3.5–5.1)
Sodium: 138 mEq/L (ref 135–145)
Total Bilirubin: 0.7 mg/dL (ref 0.2–1.2)
Total Protein: 6.4 g/dL (ref 6.0–8.3)

## 2021-10-27 NOTE — Assessment & Plan Note (Signed)
Adequately controlled.  She will continue amlodipine 5 mg once daily.  The swelling may be related to the amlodipine.  She will monitor for now. ?

## 2021-10-27 NOTE — Progress Notes (Signed)
?Tommi Rumps, MD ?Phone: [phone] ? ?Alice Campbell is a 71 y.o. female who presents today for f/u. ? ?HYPERTENSION ?Disease Monitoring ?Home BP Monitoring not checking Chest pain- no    Dyspnea- no ?Medications ?Compliance-  taking amlodipine. Edema- notes some with warm weather ?BMET ?   ?Component Value Date/Time  ? NA 140 10/12/2020 1155  ? K 4.1 10/12/2020 1155  ? CL 103 10/12/2020 1155  ? CO2 31 10/12/2020 1155  ? GLUCOSE 91 10/12/2020 1155  ? BUN 10 10/12/2020 1155  ? CREATININE 0.88 10/12/2020 1155  ? CREATININE 0.93 10/25/2013 1547  ? CALCIUM 9.6 10/12/2020 1155  ? GFRNONAA >60 02/05/2019 1028  ? GFRAA >60 02/05/2019 1028  ? ?HYPERLIPIDEMIA ?Symptoms ?Chest pain on exertion:  no   ?Medications: ?Compliance- taking lipitor Right upper quadrant pain- no  Muscle aches- no ?Lipid Panel  ?   ?Component Value Date/Time  ? CHOL 137 10/12/2020 1155  ? TRIG 108.0 10/12/2020 1155  ? HDL 50.30 10/12/2020 1155  ? CHOLHDL 3 10/12/2020 1155  ? VLDL 21.6 10/12/2020 1155  ? [place] 65 10/12/2020 1155  ? ?Loose stools: Patient notes this typically occurs after she eats her first meal of the day.  She get a little cramping and then passing stool.  There is no watery stool.  No blood in her stool, nausea, or vomiting.  She does pass lots of gas.  She notes no food changes, medication changes, or over-the-counter supplements. ? 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  She notes getting pain a couple times a month and taking tramadol.  She notes some drowsiness when taking the tramadol only if she sits down and is not active after taking it.  She also takes gabapentin. ? ?Social History  ? ?Tobacco Use  ?Smoking Status Former  ? Types: Cigarettes  ? Quit date: 01/19/1996  ? Years since quitting: 25.7  ?Smokeless Tobacco Never  ?Tobacco Comments  ? quit in 2000  ? ? ?Current Outpatient Medications on File Prior to Visit  ?Medication Sig Dispense Refill  ?  amLODipine (NORVASC) 5 MG tablet TAKE 1 TABLET BY MOUTH EVERY DAY 90 tablet 1  ? atorvastatin (LIPITOR) 20 MG tablet TAKE 1 TABLET BY MOUTH EVERY DAY 90 tablet 0  ? gabapentin (NEURONTIN) 100 MG capsule TAKE 4 CAPSULES (400 MG) BY MOUTH IN THE MORNING AND TAKE 3 CAPSULES BY MOUTH IN THE EVENING. 630 capsule 1  ? meloxicam (MOBIC) 7.5 MG tablet Take 7.5 mg by mouth daily.    ? traMADol (ULTRAM) 50 MG tablet TAKE 2 TABLETS (100 MG TOTAL) BY MOUTH EVERY 8 (EIGHT) HOURS AS NEEDED. FOR PAIN 60 tablet 0  ? trolamine salicylate (ASPERCREME) 10 % cream Apply 1 application topically as needed for muscle pain.    ? ?No current facility-administered medications on file prior to visit.  ? ? ? ?ROS see history of present illness ? ?Objective ? ?Physical Exam ?Vitals:  ? 10/27/21 1041  ?BP: 122/80  ?Pulse: 77  ?Temp: 98.4 ?F (36.9 ?C)  ?SpO2: 96%  ? ? ?BP Readings from Last 3 Encounters:  ?10/27/21 122/80  ?04/28/21 120/80  ?01/19/21 (!) 175/82  ? ?Wt Readings from Last 3 Encounters:  ?10/27/21 218 lb 9.6 oz (99.2 kg)  ?08/30/21 226 lb (102.5 kg)  ?04/28/21 226 lb 9.6 oz (102.8 kg)  ? ? ?Physical Exam ?Constitutional:   ?   General: She is not in acute distress. ?  Appearance: She is not diaphoretic.  ?Cardiovascular:  ?   Rate and Rhythm: Normal rate and regular rhythm.  ?   Heart sounds: Normal heart sounds.  ?Pulmonary:  ?   Effort: Pulmonary effort is normal.  ?   Breath sounds: Normal breath sounds.  ?Abdominal:  ?   General: Bowel sounds are normal. There is no distension.  ?   Palpations: Abdomen is soft.  ?   Tenderness: There is no abdominal tenderness.  ?Skin: ?   General: Skin is warm and dry.  ?Neurological:  ?   Mental Status: She is alert.  ? ? ? ?Assessment/Plan: Please see individual problem list. ? ?Problem List Items Addressed This Visit   ? ? HTN (hypertension) - Primary (Chronic)  ?  Adequately controlled.  She will continue amlodipine 5 mg once daily.  The swelling may be related to the amlodipine.  She will  monitor for now. ? ?  ?  ? Relevant Orders  ? Comp Met (CMET)  ? Hyperlipidemia (Chronic)  ?  Continue Lipitor 20 mg once daily.  Check lipid panel. ? ?  ?  ? Relevant Orders  ? Comp Met (CMET)  ? Lipid panel  ? Obesity (Chronic)  ? Relevant Orders  ? HgB A1c  ? Right flank pain (Chronic)  ?  Chronic issue.  She will continue gabapentin 400 mg in the morning and 300 mg in the evening.  She can continue as needed tramadol.  Controlled substance database reviewed. ? ?  ?  ? Loose stools  ?  Likely diet mediated though could be IBS.  We will have her try a FODMAP diet and see if that is beneficial.  If its not improving she will let us know.  I did discuss she could try Imodium periodically if she is out and about when having her first meal of the day. ? ?  ?  ? ? ? ?Return in about 6 months (around 04/29/2022). ? ? ?Tommi Rumps, MD ?[place] ? ?

## 2021-10-27 NOTE — Assessment & Plan Note (Addendum)
Likely diet mediated though could be IBS.  We will have her try a FODMAP diet and see if that is beneficial.  If its not improving she will let us know.  I did discuss she could try Imodium periodically if she is out and about when having her first meal of the day. ?

## 2021-10-27 NOTE — Patient Instructions (Addendum)
Nice to see you. ?Please look at the diet below and try to cut out some foods that you eat a lot of to see if that helps with your stools. ? ?Low-FODMAP Eating Plan ? ?FODMAP stands for fermentable oligosaccharides, disaccharides, monosaccharides, and polyols. These are sugars that are hard for some people to digest. A low-FODMAP eating plan may help some people who have irritable bowel syndrome (IBS) and certain other bowel (intestinal) diseases to manage their symptoms. ?This meal plan can be complicated to follow. Work with a diet and nutrition specialist (dietitian) to make a low-FODMAP eating plan that is right for you. A dietitian can help make sure that you get enough nutrition from this diet. ?What are tips for following this plan? ?Reading food labels ?Check labels for hidden FODMAPs such as: ?High-fructose syrup. ?Honey. ?Agave. ?Natural fruit flavors. ?Onion or garlic powder. ?Choose low-FODMAP foods that contain 3-4 grams of fiber per serving. ?Check food labels for serving sizes. Eat only one serving at a time to make sure FODMAP levels stay low. ?Shopping ?Shop with a list of foods that are recommended on this diet and make a meal plan. ?Meal planning ?Follow a low-FODMAP eating plan for up to 6 weeks, or as told by your health care provider or dietitian. ?To follow the eating plan: ?Eliminate high-FODMAP foods from your diet completely. Choose only low-FODMAP foods to eat. You will do this for 2-6 weeks. ?Gradually reintroduce high-FODMAP foods into your diet one at a time. Most people should wait a few days before introducing the next new high-FODMAP food into their meal plan. Your dietitian can recommend how quickly you may reintroduce foods. ?Keep a daily record of what and how much you eat and drink. Make note of any symptoms that you have after eating. ?Review your daily record with a dietitian regularly to identify which foods you can eat and which foods you should avoid. ?General tips ?Drink  enough fluid each day to keep your urine pale yellow. ?Avoid processed foods. These often have added sugar and may be high in FODMAPs. ?Avoid most dairy products, whole grains, and sweeteners. ?Work with a dietitian to make sure you get enough fiber in your diet. ?Avoid high FODMAP foods at meals to manage symptoms. ?Recommended foods ?Fruits ?Bananas, oranges, tangerines, lemons, limes, blueberries, raspberries, strawberries, grapes, cantaloupe, honeydew melon, kiwi, papaya, passion fruit, and pineapple. Limited amounts of dried cranberries, banana chips, and shredded coconut. ?Vegetables ?Eggplant, zucchini, cucumber, peppers, green beans, bean sprouts, lettuce, arugula, kale, Swiss chard, spinach, collard greens, bok choy, summer squash, potato, and tomato. Limited amounts of corn, carrot, and sweet potato. Green parts of scallions. ?Grains ?Gluten-free grains, such as rice, oats, buckwheat, quinoa, corn, polenta, and millet. Gluten-free pasta, bread, or cereal. Rice noodles. Corn tortillas. ?Meats and other proteins ?Unseasoned beef, pork, poultry, or fish. Eggs. Tomasa Blase. Tofu (firm) and tempeh. Limited amounts of nuts and seeds, such as almonds, walnuts, [place] nuts, pecans, peanuts, nut butters, pumpkin seeds, chia seeds, and sunflower seeds. ?Dairy ?Lactose-free milk, yogurt, and kefir. Lactose-free cottage cheese and ice cream. Non-dairy milks, such as almond, coconut, hemp, and rice milk. Non-dairy yogurt. Limited amounts of goat cheese, brie, mozzarella, parmesan, swiss, and other hard cheeses. ?Fats and oils ?Butter-free spreads. Vegetable oils, such as olive, canola, and sunflower oil. ?Seasoning and other foods ?Artificial sweeteners with names that do not end in "ol," such as aspartame, saccharine, and stevia. Maple syrup, white table sugar, raw sugar, brown sugar, and molasses. Mayonnaise, soy sauce,  and tamari. Fresh basil, coriander, parsley, rosemary, and thyme. ?Beverages ?Water and mineral water.  Sugar-sweetened soft drinks. Small amounts of orange juice or cranberry juice. Black and green tea. Most dry wines. Coffee. ?The items listed above may not be a complete list of foods and beverages you can eat. Contact a dietitian for more information. ?Foods to avoid ?Fruits ?Fresh, dried, and juiced forms of apple, pear, watermelon, peach, plum, cherries, apricots, blackberries, boysenberries, figs, nectarines, and mango. Avocado. ?Vegetables ?Chicory root, artichoke, asparagus, cabbage, snow peas, Brussels sprouts, broccoli, sugar snap peas, mushrooms, celery, and cauliflower. Onions, garlic, leeks, and the white part of scallions. ?Grains ?Wheat, including kamut, durum, and semolina. Barley and bulgur. Couscous. Wheat-based cereals. Wheat noodles, bread, crackers, and pastries. ?Meats and other proteins ?Fried or fatty meat. Sausage. Cashews and pistachios. Soybeans, baked beans, black beans, chickpeas, kidney beans, fava beans, navy beans, lentils, black-eyed peas, and split peas. ?Dairy ?Milk, yogurt, ice cream, and soft cheese. Cream and sour cream. Milk-based sauces. Custard. Buttermilk. Soy milk. ?Seasoning and other foods ?Any sugar-free gum or candy. Foods that contain artificial sweeteners such as sorbitol, mannitol, isomalt, or xylitol. Foods that contain honey, high-fructose corn syrup, or agave. Bouillon, vegetable stock, beef stock, and chicken stock. Garlic and onion powder. Condiments made with onion, such as hummus, chutney, pickles, relish, salad dressing, and salsa. Tomato paste. ?Beverages ?Chicory-based drinks. Coffee substitutes. Chamomile tea. Fennel tea. Sweet or fortified wines such as port or sherry. Diet soft drinks made with isomalt, mannitol, maltitol, sorbitol, or xylitol. Apple, pear, and mango juice. Juices with high-fructose corn syrup. ?The items listed above may not be a complete list of foods and beverages you should avoid. Contact a dietitian for more  information. ?Summary ?FODMAP stands for fermentable oligosaccharides, disaccharides, monosaccharides, and polyols. These are sugars that are hard for some people to digest. ?A low-FODMAP eating plan is a short-term diet that helps to ease symptoms of certain bowel diseases. ?The eating plan usually lasts up to 6 weeks. After that, high-FODMAP foods are reintroduced gradually and one at a time. This can help you find out which foods may be causing symptoms. ?A low-FODMAP eating plan can be complicated. It is best to work with a dietitian who has experience with this type of plan. ?This information is not intended to replace advice given to you by your health care provider. Make sure you discuss any questions you have with your health care provider. ?Document Revised: 10/31/2019 Document Reviewed: 10/31/2019 ?Elsevier Patient Education ? 2023 Elsevier Inc. ? ?

## 2021-10-27 NOTE — Assessment & Plan Note (Signed)
Continue Lipitor 20 mg once daily.  Check lipid panel. ?

## 2021-10-27 NOTE — Assessment & Plan Note (Signed)
Chronic issue.  She will continue gabapentin 400 mg in the morning and 300 mg in the evening.  She can continue as needed tramadol.  Controlled substance database reviewed. ?

## 2021-11-15 ENCOUNTER — Other Ambulatory Visit: Payer: Self-pay | Admitting: Family Medicine

## 2021-11-15 NOTE — Telephone Encounter (Signed)
Refilled: 08/25/2021 Last OV: 10/27/2021 Next OV: 05/02/2022

## 2021-11-24 ENCOUNTER — Other Ambulatory Visit: Payer: Self-pay | Admitting: Student

## 2021-11-26 ENCOUNTER — Other Ambulatory Visit: Payer: Self-pay | Admitting: Student

## 2021-11-26 DIAGNOSIS — M5416 Radiculopathy, lumbar region: Secondary | ICD-10-CM

## 2021-12-06 ENCOUNTER — Ambulatory Visit
Admission: RE | Admit: 2021-12-06 | Discharge: 2021-12-06 | Disposition: A | Discharge status: Home / Self Care | Payer: PPO | Source: Ambulatory Visit | Attending: Student | Admitting: Student

## 2021-12-06 DIAGNOSIS — M5416 Radiculopathy, lumbar region: Secondary | ICD-10-CM

## 2021-12-06 DIAGNOSIS — M47816 Spondylosis without myelopathy or radiculopathy, lumbar region: Secondary | ICD-10-CM | POA: Diagnosis not present

## 2021-12-06 MED ORDER — IOPAMIDOL (ISOVUE-M 200) INJECTION 41%
1.0000 mL | Freq: Once | INTRAMUSCULAR | Status: AC
  Administered 2021-12-06: 1 mL via EPIDURAL

## 2021-12-06 MED ORDER — METHYLPREDNISOLONE ACETATE 40 MG/ML INJ SUSP (RADIOLOG
80.0000 mg | Freq: Once | INTRAMUSCULAR | Status: AC
  Administered 2021-12-06: 80 mg via EPIDURAL

## 2021-12-06 NOTE — Discharge Instructions (Signed)

## 2021-12-16 ENCOUNTER — Other Ambulatory Visit: Payer: Self-pay | Admitting: Family Medicine

## 2022-01-06 ENCOUNTER — Other Ambulatory Visit: Payer: Self-pay | Admitting: Family Medicine

## 2022-01-18 ENCOUNTER — Other Ambulatory Visit: Payer: Self-pay | Admitting: Family Medicine

## 2022-01-22 ENCOUNTER — Other Ambulatory Visit: Payer: Self-pay | Admitting: Family Medicine

## 2022-04-18 ENCOUNTER — Other Ambulatory Visit: Payer: Self-pay | Admitting: Family Medicine

## 2022-04-26 ENCOUNTER — Other Ambulatory Visit: Payer: Self-pay | Admitting: Student

## 2022-04-26 DIAGNOSIS — M5416 Radiculopathy, lumbar region: Secondary | ICD-10-CM

## 2022-04-29 ENCOUNTER — Inpatient Hospital Stay
Admission: RE | Admit: 2022-04-29 | Discharge: 2022-04-29 | Disposition: A | Discharge status: Home / Self Care | Payer: PPO | Source: Ambulatory Visit | Attending: Student | Admitting: Student

## 2022-04-29 NOTE — Discharge Instructions (Signed)

## 2022-05-02 ENCOUNTER — Ambulatory Visit: Payer: PPO | Admitting: Family Medicine

## 2022-05-12 ENCOUNTER — Ambulatory Visit
Admission: RE | Admit: 2022-05-12 | Discharge: 2022-05-12 | Disposition: A | Discharge status: Home / Self Care | Payer: PPO | Source: Ambulatory Visit | Attending: Student | Admitting: Student

## 2022-05-12 DIAGNOSIS — M47817 Spondylosis without myelopathy or radiculopathy, lumbosacral region: Secondary | ICD-10-CM | POA: Diagnosis not present

## 2022-05-12 DIAGNOSIS — M5416 Radiculopathy, lumbar region: Secondary | ICD-10-CM

## 2022-05-12 MED ORDER — METHYLPREDNISOLONE ACETATE 40 MG/ML INJ SUSP (RADIOLOG
80.0000 mg | Freq: Once | INTRAMUSCULAR | Status: AC
  Administered 2022-05-12: 80 mg via EPIDURAL

## 2022-05-12 MED ORDER — IOPAMIDOL (ISOVUE-M 200) INJECTION 41%
1.0000 mL | Freq: Once | INTRAMUSCULAR | Status: AC
  Administered 2022-05-12: 1 mL via EPIDURAL

## 2022-05-12 NOTE — Discharge Instructions (Signed)

## 2022-06-08 ENCOUNTER — Ambulatory Visit: Payer: PPO | Admitting: Family Medicine

## 2022-06-27 ENCOUNTER — Other Ambulatory Visit: Payer: Self-pay | Admitting: Family Medicine

## 2022-07-08 ENCOUNTER — Ambulatory Visit: Payer: PPO | Admitting: Family Medicine

## 2022-07-16 ENCOUNTER — Other Ambulatory Visit: Payer: Self-pay | Admitting: Family Medicine

## 2022-07-26 ENCOUNTER — Telehealth: Payer: Self-pay | Admitting: Family Medicine

## 2022-07-26 ENCOUNTER — Ambulatory Visit (INDEPENDENT_AMBULATORY_CARE_PROVIDER_SITE_OTHER): Payer: PPO | Admitting: Family Medicine

## 2022-07-26 ENCOUNTER — Encounter: Payer: Self-pay | Admitting: Family Medicine

## 2022-07-26 VITALS — BP 120/78 | HR 61 | Temp 98.2°F | Ht 65.0 in | Wt 218.8 lb

## 2022-07-26 DIAGNOSIS — Z1231 Encounter for screening mammogram for malignant neoplasm of breast: Secondary | ICD-10-CM

## 2022-07-26 DIAGNOSIS — I10 Essential (primary) hypertension: Secondary | ICD-10-CM | POA: Diagnosis not present

## 2022-07-26 DIAGNOSIS — R195 Other fecal abnormalities: Secondary | ICD-10-CM | POA: Diagnosis not present

## 2022-07-26 DIAGNOSIS — R109 Unspecified abdominal pain: Secondary | ICD-10-CM

## 2022-07-26 DIAGNOSIS — Z780 Asymptomatic menopausal state: Secondary | ICD-10-CM

## 2022-07-26 NOTE — Telephone Encounter (Signed)
I noticed the patient was due for a mammogram and bone density scan after she left the office. Please let her know that she can call [phone] to schedule those if she is willing to do them.

## 2022-07-26 NOTE — Assessment & Plan Note (Signed)
Chronic issue.  Well-controlled.  Continue amlodipine 5 mg daily. 

## 2022-07-26 NOTE — Assessment & Plan Note (Signed)
Chronic issue.  Adequately controlled.  Continue gabapentin 400 mg in the morning and 300 milligrams in the evening.  She can continue tramadol 100 mg by mouth every 8 hours as needed.  She will monitor her drowsiness and will not drive if she is drowsy while taking this.

## 2022-07-26 NOTE — Progress Notes (Signed)
Tommi Rumps, MD Phone: [phone]  Alice Campbell is a 72 y.o. female who presents today for f/u.  HYPERTENSION Disease Monitoring Home BP Monitoring not checking Chest pain- no    Dyspnea- no Medications Compliance-  taking amlodipine.  Edema- no BMET    Component Value Date/Time   NA 138 10/27/2021 1053   K 4.2 10/27/2021 1053   CL 104 10/27/2021 1053   CO2 29 10/27/2021 1053   GLUCOSE 88 10/27/2021 1053   BUN 11 10/27/2021 1053   CREATININE 0.82 10/27/2021 1053   CREATININE 0.93 10/25/2013 1547   CALCIUM 9.0 10/27/2021 1053   GFRNONAA >60 02/05/2019 1028   GFRAA >60 02/05/2019 1028   Right flank pain: This is a chronic issue.  Her tramadol and gabapentin worked well for this.  She is now currently rarely taking the tramadol.  She does get a little drowsy when she takes it and does not drive.  Chronic diarrhea: Patient continues to have diarrhea after she eats.  Typically it only occurs in the morning though it can happen at other times of the day.  She does produce a lot of gas.  No abdominal pain or blood in her stool.  The only thing she has been able to link to this in her diet would be sugar intake.  She eats a pretty varied diet and was not able to identify anything on the FODMAP diet that could be contributing though she did not cut anything out.  She has also started taking fiber.  She has tried Imodium though this tends to stop her out for several days.  If she only takes 1 Imodium tablet it does not work.  Social History   Tobacco Use  Smoking Status Former   Types: Cigarettes   Quit date: 01/19/1996   Years since quitting: 26.5  Smokeless Tobacco Never  Tobacco Comments   quit in 2000    Current Outpatient Medications on File Prior to Visit  Medication Sig Dispense Refill   amLODipine (NORVASC) 5 MG tablet TAKE 1 TABLET BY MOUTH EVERY DAY 90 tablet 1   atorvastatin (LIPITOR) 20 MG tablet TAKE 1 TABLET BY MOUTH EVERY DAY 90 tablet 0   gabapentin  (NEURONTIN) 100 MG capsule TAKE 4 CAPSULES (400 MG) BY MOUTH IN THE MORNING AND TAKE 3 CAPSULES BY MOUTH IN THE EVENING. 630 capsule 1   meloxicam (MOBIC) 7.5 MG tablet Take 7.5 mg by mouth daily.     traMADol (ULTRAM) 50 MG tablet TAKE 2 TABLETS (100 MG TOTAL) BY MOUTH EVERY 8 (EIGHT) HOURS AS NEEDED. FOR PAIN 60 tablet 0   trolamine salicylate (ASPERCREME) 10 % cream Apply 1 application topically as needed for muscle pain.     No current facility-administered medications on file prior to visit.     ROS see history of present illness  Objective  Physical Exam Vitals:   07/26/22 1001  BP: 120/78  Pulse: 61  Temp: 98.2 F (36.8 C)  SpO2: 98%    BP Readings from Last 3 Encounters:  07/26/22 120/78  05/12/22 (!) 144/97  12/06/21 (!) 151/74   Wt Readings from Last 3 Encounters:  07/26/22 218 lb 12.8 oz (99.2 kg)  10/27/21 218 lb 9.6 oz (99.2 kg)  08/30/21 226 lb (102.5 kg)    Physical Exam Constitutional:      General: She is not in acute distress.    Appearance: She is not diaphoretic.  Cardiovascular:     Rate and Rhythm: Normal rate and  regular rhythm.     Heart sounds: Normal heart sounds.  Pulmonary:     Effort: Pulmonary effort is normal.     Breath sounds: Normal breath sounds.  Abdominal:     General: Bowel sounds are normal. There is no distension.     Palpations: Abdomen is soft.     Tenderness: There is no abdominal tenderness.  Skin:    General: Skin is warm and dry.  Neurological:     Mental Status: She is alert.      Assessment/Plan: Please see individual problem list.  Primary hypertension Assessment & Plan: Chronic issue.  Well-controlled.  Continue amlodipine 5 mg daily.   Right flank pain Assessment & Plan: Chronic issue.  Adequately controlled.  Continue gabapentin 400 mg in the morning and 300 milligrams in the evening.  She can continue tramadol 100 mg by mouth every 8 hours as needed.  She will monitor her drowsiness and will not  drive if she is drowsy while taking this.   Loose stools Assessment & Plan: Chronic issue.  Possibly related to sugar intake.  I encouraged her to reduce her sugar intake.  She can continue intermittent Imodium use.  Refer to GI for further evaluation.  Orders: -     Ambulatory referral to Gastroenterology    Return in about 6 months (around 01/24/2023).   Tommi Rumps, MD [place]

## 2022-07-26 NOTE — Assessment & Plan Note (Signed)
Chronic issue.  Possibly related to sugar intake.  I encouraged her to reduce her sugar intake.  She can continue intermittent Imodium use.  Refer to GI for further evaluation.

## 2022-07-26 NOTE — Telephone Encounter (Signed)
I called and spoke with the patient and informed her that she is due a Mammogram and a Bone density and she needed to call and schedule and she understood. Candita Borenstein,cma

## 2022-07-26 NOTE — Patient Instructions (Signed)
Nice to see you. GI should contact you to schedule an appointment.  If you do not hear from them in the next 1 to 2 weeks please let us know.

## 2022-08-26 ENCOUNTER — Telehealth: Payer: Self-pay | Admitting: Family Medicine

## 2022-08-26 NOTE — Telephone Encounter (Signed)
Contacted DINORA FACENDA to schedule their annual wellness visit. Appointment made for 09/01/2022.  Thank you,  [place] Direct dial  [phone]

## 2022-09-01 ENCOUNTER — Ambulatory Visit (INDEPENDENT_AMBULATORY_CARE_PROVIDER_SITE_OTHER): Payer: PPO

## 2022-09-01 VITALS — Ht 65.0 in | Wt 218.0 lb

## 2022-09-01 DIAGNOSIS — Z0000 Encounter for general adult medical examination without abnormal findings: Secondary | ICD-10-CM

## 2022-09-01 NOTE — Patient Instructions (Addendum)
[name] , Thank you for taking time to come for your Medicare Wellness Visit. I appreciate your ongoing commitment to your health goals. Please review the following plan we discussed and let me know if I can assist you in the future.   These are the goals we discussed:  Goals      Maintain Healthy Lifestyle     Stay active. Low carb diet. Stay hydrated.        This is a list of the screening recommended for you and due dates:  Health Maintenance  Topic Date Due   DEXA scan (bone density measurement)  Never done   Mammogram  07/20/2022   COVID-19 Vaccine (5 - 2023-24 season) 09/17/2022*   Medicare Annual Wellness Visit  09/01/2023   DTaP/Tdap/Td vaccine (2 - Tdap) 12/10/2023   Colon Cancer Screening  03/21/2024   Pneumonia Vaccine  Completed   Flu Shot  Completed   Hepatitis C Screening: USPSTF Recommendation to screen - Ages 18-79 yo.  Completed   Zoster (Shingles) Vaccine  Completed   HPV Vaccine  Aged Out  *Topic was postponed. The date shown is not the original due date.    Advanced directives: not yet completed.   Conditions/risks identified: none new.   Next appointment: Follow up in one year for your annual wellness visit    Preventive Care 65 Years and Older, Female Preventive care refers to lifestyle choices and visits with your health care provider that can promote health and wellness. What does preventive care include? A yearly physical exam. This is also called an annual well check. Dental exams once or twice a year. Routine eye exams. Ask your health care provider how often you should have your eyes checked. Personal lifestyle choices, including: Daily care of your teeth and gums. Regular physical activity. Eating a healthy diet. Avoiding tobacco and drug use. Limiting alcohol use. Practicing safe sex. Taking low-dose aspirin every day. Taking vitamin and mineral supplements as recommended by your health care provider. What happens during an annual  well check? The services and screenings done by your health care provider during your annual well check will depend on your age, overall health, lifestyle risk factors, and family history of disease. Counseling  Your health care provider may ask you questions about your: Alcohol use. Tobacco use. Drug use. Emotional well-being. Home and relationship well-being. Sexual activity. Eating habits. History of falls. Memory and ability to understand (cognition). Work and work Statistician. Reproductive health. Screening  You may have the following tests or measurements: Height, weight, and BMI. Blood pressure. Lipid and cholesterol levels. These may be checked every 5 years, or more frequently if you are over 21 years old. Skin check. Lung cancer screening. You may have this screening every year starting at age 65 if you have a 30-pack-year history of smoking and currently smoke or have quit within the past 15 years. Fecal occult blood test (FOBT) of the stool. You may have this test every year starting at age 35. Flexible sigmoidoscopy or colonoscopy. You may have a sigmoidoscopy every 5 years or a colonoscopy every 10 years starting at age 58. Hepatitis C blood test. Hepatitis B blood test. Sexually transmitted disease (STD) testing. Diabetes screening. This is done by checking your blood sugar (glucose) after you have not eaten for a while (fasting). You may have this done every 1-3 years. Bone density scan. This is done to screen for osteoporosis. You may have this done starting at age 40. Mammogram. This may  be done every 1-2 years. Talk to your health care provider about how often you should have regular mammograms. Talk with your health care provider about your test results, treatment options, and if necessary, the need for more tests. Vaccines  Your health care provider may recommend certain vaccines, such as: Influenza vaccine. This is recommended every year. Tetanus, diphtheria,  and acellular pertussis (Tdap, Td) vaccine. You may need a Td booster every 10 years. Zoster vaccine. You may need this after age 82. Pneumococcal 13-valent conjugate (PCV13) vaccine. One dose is recommended after age 95. Pneumococcal polysaccharide (PPSV23) vaccine. One dose is recommended after age 73. Talk to your health care provider about which screenings and vaccines you need and how often you need them. This information is not intended to replace advice given to you by your health care provider. Make sure you discuss any questions you have with your health care provider. Document Released: 07/10/2015 Document Revised: 03/02/2016 Document Reviewed: 04/14/2015 Elsevier Interactive Patient Education  2017 [place] Prevention in the Home Falls can cause injuries. They can happen to people of all ages. There are many things you can do to make your home safe and to help prevent falls. What can I do on the outside of my home? Regularly fix the edges of walkways and driveways and fix any cracks. Remove anything that might make you trip as you walk through a door, such as a raised step or threshold. Trim any bushes or trees on the path to your home. Use bright outdoor lighting. Clear any walking paths of anything that might make someone trip, such as rocks or tools. Regularly check to see if handrails are loose or broken. Make sure that both sides of any steps have handrails. Any raised decks and porches should have guardrails on the edges. Have any leaves, snow, or ice cleared regularly. Use sand or salt on walking paths during winter. Clean up any spills in your garage right away. This includes oil or grease spills. What can I do in the bathroom? Use night lights. Install grab bars by the toilet and in the tub and shower. Do not use towel bars as grab bars. Use non-skid mats or decals in the tub or shower. If you need to sit down in the shower, use a plastic, non-slip  stool. Keep the floor dry. Clean up any water that spills on the floor as soon as it happens. Remove soap buildup in the tub or shower regularly. Attach bath mats securely with double-sided non-slip rug tape. Do not have throw rugs and other things on the floor that can make you trip. What can I do in the bedroom? Use night lights. Make sure that you have a light by your bed that is easy to reach. Do not use any sheets or blankets that are too big for your bed. They should not hang down onto the floor. Have a firm chair that has side arms. You can use this for support while you get dressed. Do not have throw rugs and other things on the floor that can make you trip. What can I do in the kitchen? Clean up any spills right away. Avoid walking on wet floors. Keep items that you use a lot in easy-to-reach places. If you need to reach something above you, use a strong step stool that has a grab bar. Keep electrical cords out of the way. Do not use floor polish or wax that makes floors slippery. If you must use  wax, use non-skid floor wax. Do not have throw rugs and other things on the floor that can make you trip. What can I do with my stairs? Do not leave any items on the stairs. Make sure that there are handrails on both sides of the stairs and use them. Fix handrails that are broken or loose. Make sure that handrails are as long as the stairways. Check any carpeting to make sure that it is firmly attached to the stairs. Fix any carpet that is loose or worn. Avoid having throw rugs at the top or bottom of the stairs. If you do have throw rugs, attach them to the floor with carpet tape. Make sure that you have a light switch at the top of the stairs and the bottom of the stairs. If you do not have them, ask someone to add them for you. What else can I do to help prevent falls? Wear shoes that: Do not have high heels. Have rubber bottoms. Are comfortable and fit you well. Are closed at the  toe. Do not wear sandals. If you use a stepladder: Make sure that it is fully opened. Do not climb a closed stepladder. Make sure that both sides of the stepladder are locked into place. Ask someone to hold it for you, if possible. Clearly mark and make sure that you can see: Any grab bars or handrails. First and last steps. Where the edge of each step is. Use tools that help you move around (mobility aids) if they are needed. These include: Canes. Walkers. Scooters. Crutches. Turn on the lights when you go into a dark area. Replace any light bulbs as soon as they burn out. Set up your furniture so you have a clear path. Avoid moving your furniture around. If any of your floors are uneven, fix them. If there are any pets around you, be aware of where they are. Review your medicines with your doctor. Some medicines can make you feel dizzy. This can increase your chance of falling. Ask your doctor what other things that you can do to help prevent falls. This information is not intended to replace advice given to you by your health care provider. Make sure you discuss any questions you have with your health care provider. Document Released: 04/09/2009 Document Revised: 11/19/2015 Document Reviewed: 07/18/2014 Elsevier Interactive Patient Education  2017 [employer].

## 2022-09-01 NOTE — Progress Notes (Signed)
Subjective:   Alice Campbell is a 72 y.o. female who presents for Medicare Annual (Subsequent) preventive examination.  Review of Systems    No ROS.  Medicare Wellness Virtual Visit.  Visual/audio telehealth visit, UTA vital signs.   See social history for additional risk factors.   Cardiac Risk Factors include: advanced age (>26mn, >>27women)     Objective:    Today's Vitals   09/01/22 1402  Weight: 218 lb (98.9 kg)  Height: '5\' 5"'$  (1.651 m)   Body mass index is 36.28 kg/m.     09/01/2022    2:02 PM 08/30/2021    1:36 PM 08/27/2020   12:04 PM 07/23/2019   12:14 PM 02/11/2019    4:28 PM 02/05/2019    9:29 AM 07/18/2018    1:33 PM  Advanced Directives  Does Patient Have a Medical Advance Directive? No No No No No No No  Would patient like information on creating a medical advance directive? No - Patient declined No - Patient declined No - Patient declined No - Patient declined No - Patient declined No - Patient declined No - Patient declined    Current Medications (verified) Outpatient Encounter Medications as of 09/01/2022  Medication Sig   amLODipine (NORVASC) 5 MG tablet TAKE 1 TABLET BY MOUTH EVERY DAY   atorvastatin (LIPITOR) 20 MG tablet TAKE 1 TABLET BY MOUTH EVERY DAY   gabapentin (NEURONTIN) 100 MG capsule TAKE 4 CAPSULES (400 MG) BY MOUTH IN THE MORNING AND TAKE 3 CAPSULES BY MOUTH IN THE EVENING.   meloxicam (MOBIC) 7.5 MG tablet Take 7.5 mg by mouth daily.   traMADol (ULTRAM) 50 MG tablet TAKE 2 TABLETS (100 MG TOTAL) BY MOUTH EVERY 8 (EIGHT) HOURS AS NEEDED. FOR PAIN   trolamine salicylate (ASPERCREME) 10 % cream Apply 1 application topically as needed for muscle pain.   No facility-administered encounter medications on file as of 09/01/2022.    Allergies (verified) Patient has no known allergies.   History: Past Medical History:  Diagnosis Date   Arthritis    Bulging lumbar disc    Chicken pox    GERD (gastroesophageal reflux disease)    History of UTI     [facility]   Hyperlipidemia    Hypertension    Psoriasis    Past Surgical History:  Procedure Laterality Date   BIOPSY THYROID  07/2013   CHOLECYSTECTOMY  2011   FRACTURE SURGERY Right    Hip   KNEE ARTHROPLASTY Left 02/11/2019   Procedure: COMPUTER ASSISTED TOTAL KNEE ARTHROPLASTY - RNFA;  Surgeon: HDereck Leep MD;  Location: [facility] ORS;  Service: Orthopedics;  Laterality: Left;   Family History  Problem Relation Age of Onset   Heart disease Mother    Hypertension Mother    Mental illness Mother        Nervous breakdown    Cancer Father        Prostate Cancer   Heart disease Father    Hypertension Father    Diabetes Son    Breast cancer Neg Hx    Social History   Socioeconomic History   Marital status: Married    Spouse name: Not on file   Number of children: Not on file   Years of education: 12   Highest education level: Not on file  Occupational History   Occupation: Public Work    Comment: Semi-retired  Tobacco Use   Smoking status: Former    Types: Cigarettes    Quit date:  01/19/1996    Years since quitting: 26.6   Smokeless tobacco: Never   Tobacco comments:    quit in 2000  Vaping Use   Vaping Use: Never used  Substance and Sexual Activity   Alcohol use: Yes    Comment: 1-2 drinks per month   Drug use: No   Sexual activity: Yes  Other Topics Concern   Not on file  Social History Narrative   Alice Campbell grew up in [place], [place]. She is currently living in [place] with her husband. She enjoys swimming and reading.   Social Determinants of Health   Financial Resource Strain: Low Risk  (09/01/2022)   Overall Financial Resource Strain (CARDIA)    Difficulty of Paying Living Expenses: Not hard at all  Food Insecurity: No Food Insecurity (09/01/2022)   Hunger Vital Sign    Worried About Running Out of Food in the Last Year: Never true    Ran Out of Food in the Last Year: Never true  Transportation Needs: No Transportation Needs (09/01/2022)    PRAPARE - Hydrologist (Medical): No    Lack of Transportation (Non-Medical): No  Physical Activity: Insufficiently Active (09/01/2022)   Exercise Vital Sign    Days of Exercise per Week: 5 days    Minutes of Exercise per Session: 10 min  Stress: No Stress Concern Present (09/01/2022)   [place]    Feeling of Stress : Not at all  Social Connections: Unknown (09/01/2022)   Social Connection and Isolation Panel [NHANES]    Frequency of Communication with Friends and Family: More than three times a week    Frequency of Social Gatherings with Friends and Family: More than three times a week    Attends Religious Services: Not on Advertising copywriter or Organizations: Not on file    Attends Archivist Meetings: Not on file    Marital Status: Married    Tobacco Counseling Counseling given: Not Answered Tobacco comments: quit in 2000   Clinical Intake:  Pre-visit preparation completed: Yes        Diabetes: No  How often do you need to have someone help you when you read instructions, pamphlets, or other written materials from your doctor or pharmacy?: 1 - Never    Interpreter Needed?: No     Activities of Daily Living    09/01/2022    2:05 PM  In your present state of health, do you have any difficulty performing the following activities:  Hearing? 1  Vision? 0  Difficulty concentrating or making decisions? 0  Walking or climbing stairs? 1  Comment Cane assist  Dressing or bathing? 0  Doing errands, shopping? 0  Preparing Food and eating ? Y  Comment Husband assist with meal prep  Using the Toilet? N  In the past six months, have you accidently leaked urine? N  Do you have problems with loss of bowel control? Y  Comment Followed by Gastroenterology and PCP  Managing your Medications? N  Managing your Finances? N  Housekeeping or managing your Housekeeping?  Eleva assist    Patient Care Team: Leone Haven, MD as PCP - General (Family Medicine)  Indicate any recent Medical Services you may have received from other than [facility] providers in the past year (date may be approximate).     Assessment:   This is a routine wellness examination for Alice Campbell.  I connected with  Alice Campbell on 09/01/22 by a audio enabled telemedicine application and verified that I am speaking with the correct person using two identifiers.  Patient Location: Home  Provider Location: Office/Clinic  I discussed the limitations of evaluation and management by telemedicine. The patient expressed understanding and agreed to proceed.   Hearing/Vision screen Hearing Screening - Comments:: Hearing aids Vision Screening - Comments:: Wears corrective lenses They have seen their ophthalmologist in the last 12 months.    Dietary issues and exercise activities discussed: Current Exercise Habits: Home exercise routine, Intensity: Mild   Goals Addressed             This Visit's Progress    Maintain Healthy Lifestyle       Stay active. Low carb diet. Stay hydrated.       Depression Screen    09/01/2022    2:04 PM 07/26/2022   10:02 AM 08/30/2021    1:26 PM 08/27/2020   12:05 PM 07/23/2020    9:03 AM 03/16/2020   10:26 AM 07/23/2019   12:16 PM  PHQ 2/9 Scores  PHQ - 2 Score 0 0 0 0 0 0 0    Fall Risk    09/01/2022    2:03 PM 07/26/2022   10:02 AM 08/30/2021    1:30 PM 08/27/2020   12:05 PM 07/23/2020    9:03 AM  Fall Risk   Falls in the past year? 0 0 0 0 0  Number falls in past yr: 0 0  0 0  Injury with Fall? 0 0  0 0  Risk for fall due to :  No Fall Risks     Risk for fall due to: Comment Cane in use      Follow up Falls evaluation completed;Falls prevention discussed Falls evaluation completed Falls evaluation completed Falls evaluation completed Falls evaluation completed    FALL RISK PREVENTION PERTAINING TO THE HOME: Home free of loose  throw rugs in walkways, pet beds, electrical cords, etc? Yes  Adequate lighting in your home to reduce risk of falls? Yes   ASSISTIVE DEVICES UTILIZED TO PREVENT FALLS: Life alert? No  Use of a cane, walker or w/c? Yes  Grab bars in the bathroom? Yes  Shower chair or bench in shower? Yes  Comfort chair height toilet? Yes   TIMED UP AND GO: Was the test performed? No .   Cognitive Function:    07/18/2018    1:45 PM  MMSE - Mini Mental State Exam  Orientation to time 5  Orientation to Place 5  Registration 3  Attention/ Calculation 5  Recall 3  Language- name 2 objects 2  Language- repeat 1  Language- follow 3 step command 3  Language- read & follow direction 1  Write a sentence 1  Copy design 1  Total score 30        09/01/2022    2:15 PM 08/30/2021    1:37 PM 07/23/2019   12:28 PM  6CIT Screen  What Year? 0 points 0 points 0 points  What month? 0 points 0 points 0 points  What time? 0 points 0 points 0 points  Count back from 20 0 points 0 points 0 points  Months in reverse 0 points 0 points 0 points  Repeat phrase 0 points 0 points 0 points  Total Score 0 points 0 points 0 points    Immunizations Immunization History  Administered Date(s) Administered   Fluad Quad(high Dose 65+) 03/20/2019, 05/12/2022  Influenza, High Dose Seasonal PF 04/17/2017, 04/15/2018, 03/20/2020, 04/20/2021   Influenza,inj,Quad PF,6+ Mos 04/24/2017   PFIZER(Purple Top)SARS-COV-2 Vaccination 08/08/2019, 08/29/2019, 03/27/2020   PNEUMOCOCCAL CONJUGATE-20 10/12/2020   Pfizer Covid-19 Vaccine Bivalent Booster 38yr & up 06/15/2021   Pneumococcal Polysaccharide-23 07/03/2018   Td 12/09/2013   Zoster Recombinat (Shingrix) 12/24/2020, 04/20/2021   Zoster, Live 12/24/2013   Screening Tests Health Maintenance  Topic Date Due   DEXA SCAN  Never done   MAMMOGRAM  07/20/2022   COVID-19 Vaccine (5 - 2023-24 season) 09/17/2022 (Originally 02/25/2022)   Medicare Annual Wellness (AWV)  09/01/2023    DTaP/Tdap/Td (2 - Tdap) 12/10/2023   COLONOSCOPY (Pts 45-460yrInsurance coverage will need to be confirmed)  03/21/2024   Pneumonia Vaccine 6542Years old  Completed   INFLUENZA VACCINE  Completed   Hepatitis C Screening  Completed   Zoster Vaccines- Shingrix  Completed   HPV VACCINES  Aged Out    Health Maintenance Health Maintenance Due  Topic Date Due   DEXA SCAN  Never done   MAMMOGRAM  07/20/2022   Mammogram- plans to schedule.   Lung Cancer Screening: (Low Dose CT Chest recommended if Age 10545-80ears, 30 pack-year currently smoking OR have quit w/in 15years.) does not qualify.   Hepatitis C Screening: Completed 06/2018.  Vision Screening: Recommended annual ophthalmology exams for early detection of glaucoma and other disorders of the eye.   Dental Screening: Recommended annual dental exams for proper oral hygiene  Community Resource Referral / Chronic Care Management: CRR required this visit?  No   CCM required this visit?  No      Plan:     I have personally reviewed and noted the following in the patient's chart:   Medical and social history Use of alcohol, tobacco or illicit drugs  Current medications and supplements including opioid prescriptions. Patient is currently taking opioid prescriptions. Information provided to patient regarding non-opioid alternatives. Patient advised to discuss non-opioid treatment plan with their provider. Followed by pcp.  Functional ability and status Nutritional status Physical activity Advanced directives List of other physicians Hospitalizations, surgeries, and ER visits in previous 12 months Vitals Screenings to include cognitive, depression, and falls Referrals and appointments  In addition, I have reviewed and discussed with patient certain preventive protocols, quality metrics, and best practice recommendations. A written personalized care plan for preventive services as well as general preventive health  recommendations were provided to patient.     DeLeta JunglingLPN   [id number]

## 2022-09-15 ENCOUNTER — Other Ambulatory Visit: Payer: Self-pay | Admitting: Family Medicine

## 2022-09-30 ENCOUNTER — Other Ambulatory Visit: Payer: Self-pay | Admitting: Family Medicine

## 2022-10-03 NOTE — Telephone Encounter (Signed)
LOV: 07/26/2022   NOV: 01/30/23  (No contact on file)

## 2022-10-12 ENCOUNTER — Other Ambulatory Visit: Payer: Self-pay | Admitting: Family Medicine

## 2022-11-02 DIAGNOSIS — Z6841 Body Mass Index (BMI) 40.0 and over, adult: Secondary | ICD-10-CM | POA: Diagnosis not present

## 2022-11-02 DIAGNOSIS — M47816 Spondylosis without myelopathy or radiculopathy, lumbar region: Secondary | ICD-10-CM | POA: Diagnosis not present

## 2022-11-02 DIAGNOSIS — M5416 Radiculopathy, lumbar region: Secondary | ICD-10-CM | POA: Diagnosis not present

## 2022-11-02 DIAGNOSIS — M5417 Radiculopathy, lumbosacral region: Secondary | ICD-10-CM | POA: Diagnosis not present

## 2022-11-14 DIAGNOSIS — Z86010 Personal history of colonic polyps: Secondary | ICD-10-CM | POA: Diagnosis not present

## 2022-11-14 DIAGNOSIS — R197 Diarrhea, unspecified: Secondary | ICD-10-CM | POA: Diagnosis not present

## 2022-11-22 ENCOUNTER — Other Ambulatory Visit: Payer: Self-pay | Admitting: Family Medicine

## 2022-11-22 DIAGNOSIS — M5416 Radiculopathy, lumbar region: Secondary | ICD-10-CM

## 2022-11-23 ENCOUNTER — Ambulatory Visit
Admission: RE | Admit: 2022-11-23 | Discharge: 2022-11-23 | Disposition: A | Discharge status: Home / Self Care | Payer: PPO | Source: Ambulatory Visit | Attending: Family Medicine | Admitting: Family Medicine

## 2022-11-23 DIAGNOSIS — M5416 Radiculopathy, lumbar region: Secondary | ICD-10-CM | POA: Diagnosis not present

## 2022-11-23 MED ORDER — METHYLPREDNISOLONE ACETATE 40 MG/ML INJ SUSP (RADIOLOG
80.0000 mg | Freq: Once | INTRAMUSCULAR | Status: AC
  Administered 2022-11-23: 80 mg via EPIDURAL

## 2022-11-23 MED ORDER — IOPAMIDOL (ISOVUE-M 200) INJECTION 41%
1.0000 mL | Freq: Once | INTRAMUSCULAR | Status: AC
  Administered 2022-11-23: 1 mL via EPIDURAL

## 2022-11-23 NOTE — Discharge Instructions (Signed)

## 2022-11-24 ENCOUNTER — Other Ambulatory Visit: Payer: Self-pay | Admitting: Family Medicine

## 2022-12-10 ENCOUNTER — Other Ambulatory Visit: Payer: Self-pay | Admitting: Family Medicine

## 2023-01-02 ENCOUNTER — Other Ambulatory Visit: Payer: Self-pay | Admitting: Student

## 2023-01-02 DIAGNOSIS — M5416 Radiculopathy, lumbar region: Secondary | ICD-10-CM

## 2023-01-17 ENCOUNTER — Other Ambulatory Visit: Payer: Self-pay | Admitting: Family Medicine

## 2023-01-17 ENCOUNTER — Ambulatory Visit
Admission: RE | Admit: 2023-01-17 | Discharge: 2023-01-17 | Disposition: A | Discharge status: Home / Self Care | Payer: PPO | Source: Ambulatory Visit | Attending: Student | Admitting: Student

## 2023-01-17 DIAGNOSIS — M5416 Radiculopathy, lumbar region: Secondary | ICD-10-CM

## 2023-01-17 MED ORDER — METHYLPREDNISOLONE ACETATE 40 MG/ML INJ SUSP (RADIOLOG
80.0000 mg | Freq: Once | INTRAMUSCULAR | Status: AC
  Administered 2023-01-17: 80 mg via EPIDURAL

## 2023-01-17 MED ORDER — IOPAMIDOL (ISOVUE-M 200) INJECTION 41%
1.0000 mL | Freq: Once | INTRAMUSCULAR | Status: AC
  Administered 2023-01-17: 1 mL via EPIDURAL

## 2023-01-17 NOTE — Discharge Instructions (Signed)

## 2023-01-30 ENCOUNTER — Ambulatory Visit: Payer: PPO | Admitting: Family Medicine

## 2023-02-12 NOTE — H&P (Signed)
Pre-Procedure H&P   Patient ID: Alice Campbell is a 72 y.o. female.  Gastroenterology Provider: Jaynie Collins, DO  Referring Provider: Tawni Pummel, PA PCP: Glori Luis, MD  Date: 02/13/2023  HPI [name] is a 72 y.o. female who presents today for Colonoscopy for Diarrhea; personal history of colon polyps .  Patient reports 1 loose bowel movement per day for the last 6 months specifically after breakfast.  No nocturnal awakenings melena or hematochezia.  She underwent colonoscopy in September 2015 with 1 tubulovillous adenoma measuring 6 mm, diverticulosis and internal and external hemorrhoids.  She has not had a colonoscopy since  Status post cholecystectomy in 2011  Status post right femur/hip fracture with R hip replacement/metal  Daily Mobic use   Past Medical History:  Diagnosis Date   Arthritis    Bulging lumbar disc    Chicken pox    GERD (gastroesophageal reflux disease)    History of UTI    [facility]   Hyperlipidemia    Hypertension    Psoriasis     Past Surgical History:  Procedure Laterality Date   BIOPSY THYROID  07/2013   CHOLECYSTECTOMY  2011   FRACTURE SURGERY Right    Hip   KNEE ARTHROPLASTY Left 02/11/2019   Procedure: COMPUTER ASSISTED TOTAL KNEE ARTHROPLASTY - RNFA;  Surgeon: Donato Heinz, MD;  Location: [facility] ORS;  Service: Orthopedics;  Laterality: Left;    Family History No h/o GI disease or malignancy  Review of Systems  Constitutional:  Negative for activity change, appetite change, chills, diaphoresis, fatigue, fever and unexpected weight change.  HENT:  Negative for trouble swallowing and voice change.   Respiratory:  Negative for shortness of breath and wheezing.   Cardiovascular:  Negative for chest pain, palpitations and leg swelling.  Gastrointestinal:  Positive for diarrhea. Negative for abdominal distention, abdominal pain, anal bleeding, blood in stool, constipation, nausea, rectal pain and  vomiting.  Musculoskeletal:  Negative for arthralgias and myalgias.  Skin:  Negative for color change and pallor.  Neurological:  Negative for dizziness, syncope and weakness.  Psychiatric/Behavioral:  Negative for confusion.   All other systems reviewed and are negative.    Medications No current facility-administered medications on file prior to encounter.   Current Outpatient Medications on File Prior to Encounter  Medication Sig Dispense Refill   amLODipine (NORVASC) 5 MG tablet TAKE 1 TABLET BY MOUTH EVERY DAY 90 tablet 1   meloxicam (MOBIC) 7.5 MG tablet Take 7.5 mg by mouth daily.     trolamine salicylate (ASPERCREME) 10 % cream Apply 1 application topically as needed for muscle pain.      Pertinent medications related to GI and procedure were reviewed by me with the patient prior to the procedure   Current Facility-Administered Medications:    0.9 %  sodium chloride infusion, , Intravenous, Continuous, Jaynie Collins, DO, Last Rate: 20 mL/hr at 02/13/23 1328, Continued from Pre-op at 02/13/23 1328  sodium chloride 20 mL/hr at 02/13/23 1328       No Known Allergies Allergies were reviewed by me prior to the procedure  Objective   Body mass index is 35.94 kg/m. Vitals:   02/13/23 1308  BP: (!) 158/100  Pulse: 94  Resp: 16  Temp: (!) 96.1 F (35.6 C)  TempSrc: Temporal  SpO2: 98%  Weight: 98 kg  Height: 5\' 5"  (1.651 m)     Physical Exam Vitals and nursing note reviewed.  Constitutional:  General: She is not in acute distress.    Appearance: Normal appearance. She is obese. She is not ill-appearing, toxic-appearing or diaphoretic.  HENT:     Head: Normocephalic and atraumatic.     Nose: Nose normal.     Mouth/Throat:     Mouth: Mucous membranes are moist.     Pharynx: Oropharynx is clear.  Eyes:     General: No scleral icterus.    Extraocular Movements: Extraocular movements intact.  Cardiovascular:     Rate and Rhythm: Normal rate and  regular rhythm.     Heart sounds: Normal heart sounds. No murmur heard.    No friction rub. No gallop.  Pulmonary:     Effort: Pulmonary effort is normal. No respiratory distress.     Breath sounds: Normal breath sounds. No wheezing, rhonchi or rales.  Abdominal:     General: Bowel sounds are normal. There is no distension.     Palpations: Abdomen is soft.     Tenderness: There is no abdominal tenderness. There is no guarding or rebound.  Musculoskeletal:     Cervical back: Neck supple.     Right lower leg: No edema.     Left lower leg: No edema.  Skin:    General: Skin is warm and dry.     Coloration: Skin is not jaundiced or pale.  Neurological:     General: No focal deficit present.     Mental Status: She is alert and oriented to person, place, and time. Mental status is at baseline.  Psychiatric:        Mood and Affect: Mood normal.        Behavior: Behavior normal.        Thought Content: Thought content normal.        Judgment: Judgment normal.      Assessment:  [name] is a 72 y.o. female  who presents today for Colonoscopy for Diarrhea; personal history of colon polyps .  Plan:  Colonoscopy with possible intervention today  Colonoscopy with possible biopsy, control of bleeding, polypectomy, and interventions as necessary has been discussed with the patient/patient representative. Informed consent was obtained from the patient/patient representative after explaining the indication, nature, and risks of the procedure including but not limited to death, bleeding, perforation, missed neoplasm/lesions, cardiorespiratory compromise, and reaction to medications. Opportunity for questions was given and appropriate answers were provided. Patient/patient representative has verbalized understanding is amenable to undergoing the procedure.   Jaynie Collins, DO  [facility]  Portions of the record may have been created with voice recognition  software. Occasional wrong-word or 'sound-a-like' substitutions may have occurred due to the inherent limitations of voice recognition software.  Read the chart carefully and recognize, using context, where substitutions may have occurred.

## 2023-02-13 ENCOUNTER — Ambulatory Visit
Admission: RE | Admit: 2023-02-13 | Discharge: 2023-02-13 | Disposition: A | Discharge status: Home / Self Care | Payer: PPO | Source: Ambulatory Visit | Attending: Gastroenterology | Admitting: Gastroenterology

## 2023-02-13 ENCOUNTER — Other Ambulatory Visit: Payer: Self-pay

## 2023-02-13 ENCOUNTER — Ambulatory Visit: Payer: PPO | Admitting: Family Medicine

## 2023-02-13 ENCOUNTER — Ambulatory Visit: Payer: PPO | Admitting: Certified Registered"

## 2023-02-13 ENCOUNTER — Encounter
Admission: RE | Disposition: A | Discharge status: Home / Self Care | Payer: Self-pay | Source: Ambulatory Visit | Attending: Gastroenterology

## 2023-02-13 ENCOUNTER — Encounter: Payer: Self-pay | Admitting: Gastroenterology

## 2023-02-13 DIAGNOSIS — K648 Other hemorrhoids: Secondary | ICD-10-CM | POA: Insufficient documentation

## 2023-02-13 DIAGNOSIS — Z1211 Encounter for screening for malignant neoplasm of colon: Secondary | ICD-10-CM | POA: Diagnosis not present

## 2023-02-13 DIAGNOSIS — K635 Polyp of colon: Secondary | ICD-10-CM | POA: Diagnosis not present

## 2023-02-13 DIAGNOSIS — K5730 Diverticulosis of large intestine without perforation or abscess without bleeding: Secondary | ICD-10-CM | POA: Diagnosis not present

## 2023-02-13 DIAGNOSIS — Z9049 Acquired absence of other specified parts of digestive tract: Secondary | ICD-10-CM | POA: Insufficient documentation

## 2023-02-13 DIAGNOSIS — Z86010 Personal history of colonic polyps: Secondary | ICD-10-CM | POA: Insufficient documentation

## 2023-02-13 DIAGNOSIS — R197 Diarrhea, unspecified: Secondary | ICD-10-CM | POA: Diagnosis not present

## 2023-02-13 DIAGNOSIS — K644 Residual hemorrhoidal skin tags: Secondary | ICD-10-CM | POA: Diagnosis not present

## 2023-02-13 HISTORY — PX: POLYPECTOMY: SHX5525

## 2023-02-13 HISTORY — PX: COLONOSCOPY WITH PROPOFOL: SHX5780

## 2023-02-13 HISTORY — PX: BIOPSY: SHX5522

## 2023-02-13 SURGERY — COLONOSCOPY WITH PROPOFOL
Anesthesia: General

## 2023-02-13 MED ORDER — SODIUM CHLORIDE 0.9 % IV SOLN: INTRAVENOUS | Status: DC

## 2023-02-13 MED ORDER — PROPOFOL 10 MG/ML IV BOLUS
INTRAVENOUS | Status: DC | PRN
Start: 2023-02-13 — End: 2023-02-13
  Administered 2023-02-13: 150 ug/kg/min via INTRAVENOUS
  Administered 2023-02-13: 50 mg via INTRAVENOUS

## 2023-02-13 MED ORDER — LIDOCAINE HCL (CARDIAC) PF 100 MG/5ML IV SOSY
PREFILLED_SYRINGE | INTRAVENOUS | Status: DC | PRN
  Administered 2023-02-13: 100 mg via INTRAVENOUS

## 2023-02-13 NOTE — Transfer of Care (Signed)
Immediate Anesthesia Transfer of Care Note  Patient: Alice Campbell  Procedure(s) Performed: COLONOSCOPY WITH PROPOFOL BIOPSY POLYPECTOMY  Patient Location: PACU  Anesthesia Type:General  Level of Consciousness: drowsy and patient cooperative  Airway & Oxygen Therapy: Patient Spontanous Breathing  Post-op Assessment: Report given to RN and Post -op Vital signs reviewed and stable  Post vital signs: stable  Last Vitals:  Vitals Value Taken Time  BP 111/64 02/13/23 1428  Temp    Pulse 73 02/13/23 1428  Resp 18 02/13/23 1428  SpO2 98 % 02/13/23 1428  Vitals shown include unfiled device data.  Last Pain:  Vitals:   02/13/23 1308  TempSrc: Temporal  PainSc: 0-No pain         Complications: No notable events documented.

## 2023-02-13 NOTE — Interval H&P Note (Signed)
History and Physical Interval Note: Preprocedure H&P from 02/13/23  was reviewed and there was no interval change after seeing and examining the patient.  Written consent was obtained from the patient after discussion of risks, benefits, and alternatives. Patient has consented to proceed with Colonoscopy with possible intervention   02/13/2023 1:54 PM  Alice Campbell  has presented today for surgery, with the diagnosis of 787.91 (ICD-9-CM) - R19.7 (ICD-10-CM) - Diarrhea, unspecified type V12.72 (ICD-9-CM) - Z86.010 (ICD-10-CM) - History of colon polyps.  The various methods of treatment have been discussed with the patient and family. After consideration of risks, benefits and other options for treatment, the patient has consented to  Procedure(s): COLONOSCOPY WITH PROPOFOL (N/A) as a surgical intervention.  The patient's history has been reviewed, patient examined, no change in status, stable for surgery.  I have reviewed the patient's chart and labs.  Questions were answered to the patient's satisfaction.     Alice Campbell

## 2023-02-13 NOTE — Op Note (Signed)
[facility] Gastroenterology Patient Name: Alice Campbell Procedure Date: 02/13/2023 1:47 PM MRN: [id number] Account #: [id number] Date of Birth: [birth year] Admit Type: Outpatient Age: 72 Room: [facility] ENDO ROOM 2 Gender: Female Note Status: Finalized Instrument Name: Colonoscope [id number] Procedure:             Colonoscopy Indications:           High risk colon cancer surveillance: Personal history                         of colonic polyps Providers:             Jaynie Collins DO, DO Medicines:             Monitored Anesthesia Care Complications:         No immediate complications. Estimated blood loss:                         Minimal. Procedure:             Pre-Anesthesia Assessment:                        - Prior to the procedure, a History and Physical was                         performed, and patient medications and allergies were                         reviewed. The patient is competent. The risks and                         benefits of the procedure and the sedation options and                         risks were discussed with the patient. All questions                         were answered and informed consent was obtained.                         Patient identification and proposed procedure were                         verified by the physician, the nurse, the anesthetist                         and the technician in the endoscopy suite. Mental                         Status Examination: alert and oriented. Airway                         Examination: normal oropharyngeal airway and neck                         mobility. Respiratory Examination: clear to                         auscultation. CV Examination: RRR, no murmurs, no S3  or S4. Prophylactic Antibiotics: The patient does not                         require prophylactic antibiotics. Prior                         Anticoagulants: The patient has taken no anticoagulant                          or antiplatelet agents. ASA Grade Assessment: II - A                         patient with mild systemic disease. After reviewing                         the risks and benefits, the patient was deemed in                         satisfactory condition to undergo the procedure. The                         anesthesia plan was to use monitored anesthesia care                         (MAC). Immediately prior to administration of                         medications, the patient was re-assessed for adequacy                         to receive sedatives. The heart rate, respiratory                         rate, oxygen saturations, blood pressure, adequacy of                         pulmonary ventilation, and response to care were                         monitored throughout the procedure. The physical                         status of the patient was re-assessed after the                         procedure.                        After obtaining informed consent, the colonoscope was                         passed under direct vision. Throughout the procedure,                         the patient's blood pressure, pulse, and oxygen                         saturations were monitored continuously. The  Colonoscope was introduced through the anus and                         advanced to the the terminal ileum, with                         identification of the appendiceal orifice and IC                         valve. The colonoscopy was performed without                         difficulty. The patient tolerated the procedure well.                         The quality of the bowel preparation was evaluated                         using the BBPS [facility] Bowel Preparation Scale) with                         scores of: Right Colon = 2 (minor amount of residual                         staining, small fragments of stool and/or opaque                         liquid, but  mucosa seen well), Transverse Colon = 3                         (entire mucosa seen well with no residual staining,                         small fragments of stool or opaque liquid) and Left                         Colon = 2 (minor amount of residual staining, small                         fragments of stool and/or opaque liquid, but mucosa                         seen well). The total BBPS score equals 7. The quality                         of the bowel preparation was good. The terminal ileum,                         ileocecal valve, appendiceal orifice, and rectum were                         photographed. Findings:      Hemorrhoids were found on perianal exam.      The digital rectal exam was normal. Pertinent negatives include normal       sphincter tone.      The terminal ileum appeared normal. Estimated blood loss: none.  Retroflexion in the right colon was performed.      Multiple small-mouthed diverticula were found in the entire colon.       Estimated blood loss: none.      Normal mucosa was found in the entire colon. Biopsies for histology were       taken with a cold forceps from the right colon and left colon for       evaluation of microscopic colitis. Estimated blood loss was minimal.      Three sessile polyps were found in the sigmoid colon. The polyps were 1       to 2 mm in size. These polyps were removed with a jumbo cold forceps.       Resection and retrieval were complete. Estimated blood loss was minimal.      The exam was otherwise without abnormality on direct and retroflexion       views. Impression:            - Hemorrhoids found on perianal exam.                        - The examined portion of the ileum was normal.                        - Diverticulosis in the entire examined colon.                        - Normal mucosa in the entire examined colon. Biopsied.                        - Three 1 to 2 mm polyps in the sigmoid colon, removed                          with a jumbo cold forceps. Resected and retrieved.                        - The examination was otherwise normal on direct and                         retroflexion views. Recommendation:        - Patient has a contact number available for                         emergencies. The signs and symptoms of potential                         delayed complications were discussed with the patient.                         Return to normal activities tomorrow. Written                         discharge instructions were provided to the patient.                        - Discharge patient to home.                        - Resume previous diet.                        -  Continue present medications.                        - Await pathology results.                        - Repeat colonoscopy for surveillance based on                         pathology results.                        - Return to referring physician as previously                         scheduled.                        - Return to GI office as previously scheduled.                        - Consider initiation of bile acid sequestrant if                         loose stool persists                        - The findings and recommendations were discussed with                         the patient. Procedure Code(s):     --- Professional ---                        [phone], Colonoscopy, flexible; with biopsy, single or                         multiple Diagnosis Code(s):     --- Professional ---                        Z86.010, Personal history of colonic polyps                        K64.9, Unspecified hemorrhoids                        D12.5, Benign neoplasm of sigmoid colon                        K57.30, Diverticulosis of large intestine without                         perforation or abscess without bleeding CPT copyright 2022 American Medical Association. All rights reserved. The codes documented in this report are preliminary and upon  coder review may  be revised to meet current compliance requirements. Attending Participation:      I personally performed the entire procedure. Elfredia Nevins, DO Jaynie Collins DO, DO 02/13/2023 2:28:31 PM This report has been signed electronically. Number of Addenda: 0 Note Initiated On: 02/13/2023 1:47 PM Scope Withdrawal Time: 0 hours 14 minutes 14 seconds  Total Procedure Duration: 0 hours 19 minutes 11 seconds  Estimated Blood Loss:  Estimated blood loss  was minimal.      [facility]

## 2023-02-13 NOTE — Anesthesia Preprocedure Evaluation (Signed)
Anesthesia Evaluation  Patient identified by MRN, date of birth, ID band Patient awake    Reviewed: Allergy & Precautions, NPO status , Patient's Chart, lab work & pertinent test results  History of Anesthesia Complications Negative for: history of anesthetic complications  Airway Mallampati: III  TM Distance: >3 FB Neck ROM: Full    Dental  (+) Poor Dentition   Pulmonary neg shortness of breath, neg sleep apnea, neg COPD, neg recent URI, former smoker   breath sounds clear to auscultation- rhonchi (-) wheezing      Cardiovascular hypertension, Pt. on medications (-) angina (-) CAD, (-) Past MI, (-) Cardiac Stents and (-) CABG (-) dysrhythmias  Rhythm:Regular Rate:Normal - Systolic murmurs and - Diastolic murmurs    Neuro/Psych neg Seizures negative neurological ROS  negative psych ROS   GI/Hepatic Neg liver ROS,GERD  ,,  Endo/Other  negative endocrine ROSneg diabetes    Renal/GU negative Renal ROS     Musculoskeletal  (+) Arthritis ,    Abdominal  (+) + obese  Peds  Hematology negative hematology ROS (+)   Anesthesia Other Findings Past Medical History: No date: Arthritis No date: Bulging lumbar disc No date: Chicken pox No date: GERD (gastroesophageal reflux disease) No date: History of UTI     Comment:  [facility] No date: Hyperlipidemia No date: Hypertension No date: Psoriasis   Reproductive/Obstetrics                             Lab Results  Component Value Date   WBC 8.0 02/05/2019   HGB 14.7 02/05/2019   HCT 44.6 02/05/2019   MCV 97.2 02/05/2019   PLT 265 02/05/2019    Anesthesia Physical Anesthesia Plan  ASA: 2  Anesthesia Plan: General   Post-op Pain Management:    Induction: Intravenous  PONV Risk Score and Plan: 3 and Propofol infusion and TIVA  Airway Management Planned: Natural Airway and Nasal Cannula  Additional Equipment:   Intra-op Plan:    Post-operative Plan:   Informed Consent: I have reviewed the patients History and Physical, chart, labs and discussed the procedure including the risks, benefits and alternatives for the proposed anesthesia with the patient or authorized representative who has indicated his/her understanding and acceptance.     Dental advisory given  Plan Discussed with: CRNA and Anesthesiologist  Anesthesia Plan Comments:         Anesthesia Quick Evaluation

## 2023-02-14 ENCOUNTER — Encounter: Payer: Self-pay | Admitting: Gastroenterology

## 2023-02-15 NOTE — Anesthesia Postprocedure Evaluation (Signed)
Anesthesia Post Note  Patient: SELEN FESS  Procedure(s) Performed: COLONOSCOPY WITH PROPOFOL BIOPSY POLYPECTOMY  Patient location during evaluation: PACU Anesthesia Type: General Level of consciousness: awake and alert Pain management: pain level controlled Vital Signs Assessment: post-procedure vital signs reviewed and stable Respiratory status: spontaneous breathing, nonlabored ventilation, respiratory function stable and patient connected to nasal cannula oxygen Cardiovascular status: blood pressure returned to baseline and stable Postop Assessment: no apparent nausea or vomiting Anesthetic complications: no   There were no known notable events for this encounter.   Last Vitals:  Vitals:   02/13/23 1438 02/13/23 1448  BP: 135/72 (!) 148/86  Pulse: 84 74  Resp: 16 15  Temp:    SpO2: 100% 99%    Last Pain:  Vitals:   02/14/23 0734  TempSrc:   PainSc: 0-No pain                 Yevette Edwards

## 2023-02-27 ENCOUNTER — Other Ambulatory Visit: Payer: Self-pay | Admitting: Family Medicine

## 2023-04-21 ENCOUNTER — Other Ambulatory Visit: Payer: Self-pay | Admitting: Family Medicine

## 2023-05-02 ENCOUNTER — Encounter: Payer: Self-pay | Admitting: Family Medicine

## 2023-05-02 ENCOUNTER — Ambulatory Visit: Payer: PPO | Admitting: Family Medicine

## 2023-05-02 VITALS — BP 122/78 | HR 106 | Temp 98.3°F | Ht 65.0 in | Wt 224.2 lb

## 2023-05-02 DIAGNOSIS — E785 Hyperlipidemia, unspecified: Secondary | ICD-10-CM | POA: Diagnosis not present

## 2023-05-02 DIAGNOSIS — R109 Unspecified abdominal pain: Secondary | ICD-10-CM

## 2023-05-02 DIAGNOSIS — I10 Essential (primary) hypertension: Secondary | ICD-10-CM

## 2023-05-02 DIAGNOSIS — E66812 Obesity, class 2: Secondary | ICD-10-CM | POA: Diagnosis not present

## 2023-05-02 DIAGNOSIS — Z6836 Body mass index (BMI) 36.0-36.9, adult: Secondary | ICD-10-CM

## 2023-05-02 LAB — COMPREHENSIVE METABOLIC PANEL
ALT: 10 U/L (ref 0–35)
AST: 15 U/L (ref 0–37)
Albumin: 3.6 g/dL (ref 3.5–5.2)
Alkaline Phosphatase: 102 U/L (ref 39–117)
BUN: 11 mg/dL (ref 6–23)
CO2: 30 meq/L (ref 19–32)
Calcium: 9.1 mg/dL (ref 8.4–10.5)
Chloride: 104 meq/L (ref 96–112)
Creatinine, Ser: 0.76 mg/dL (ref 0.40–1.20)
GFR: 77.81 mL/min (ref >60.00)
Glucose, Bld: 105 mg/dL — ABNORMAL HIGH (ref 70–99)
Potassium: 4.1 meq/L (ref 3.5–5.1)
Sodium: 139 meq/L (ref 135–145)
Total Bilirubin: 0.6 mg/dL (ref 0.2–1.2)
Total Protein: 6.3 g/dL (ref 6.0–8.3)

## 2023-05-02 LAB — HEMOGLOBIN A1C: Hgb A1c MFr Bld: 5.4 % (ref 4.6–6.5)

## 2023-05-02 LAB — LIPID PANEL
Cholesterol: 137 mg/dL (ref 0–200)
HDL: 51.5 mg/dL (ref >39.00)
LDL Cholesterol: 60 mg/dL (ref 0–99)
NonHDL: 85.69
Total CHOL/HDL Ratio: 3
Triglycerides: 129 mg/dL (ref 0.0–149.0)
VLDL: 25.8 mg/dL (ref 0.0–40.0)

## 2023-05-02 MED ORDER — TRAMADOL HCL 50 MG PO TABS
100.0000 mg | ORAL_TABLET | Freq: Three times a day (TID) | ORAL | 1 refills | Status: DC | PRN
Start: 2023-05-02 — End: 2023-10-30

## 2023-05-02 NOTE — Assessment & Plan Note (Signed)
Chronic issue.  Adequately managed at this time.  She will continue gabapentin 400 mg in the morning and 300 mg in the evening.  She can continue tramadol 100 mg every 8 hours as needed for pain.  Controlled substance database reviewed.  Refill provided.

## 2023-05-02 NOTE — Progress Notes (Signed)
Marikay Alar, MD Phone: [phone]  CADYN RODGER is a 72 y.o. female who presents today for follow-up.  Hypertension: Patient notes blood pressures are typically similar to today when she does check.  She is on amlodipine.  No chest pain.  No change to chronic dyspnea.  No change to chronic intermittent edema.  Flank pain: This is a chronic intermittent issue.  This is adequately managed by tramadol that she takes 2-3 times a month.  She notes some drowsiness with the tramadol so she does not drive when she takes it.  Social History   Tobacco Use  Smoking Status Former   Current packs/day: 0.00   Types: Cigarettes   Quit date: 01/19/1996   Years since quitting: 27.3  Smokeless Tobacco Never  Tobacco Comments   quit in 2000    Current Outpatient Medications on File Prior to Visit  Medication Sig Dispense Refill   amLODipine (NORVASC) 5 MG tablet TAKE 1 TABLET BY MOUTH EVERY DAY 90 tablet 1   atorvastatin (LIPITOR) 20 MG tablet TAKE 1 TABLET BY MOUTH EVERY DAY 90 tablet 0   gabapentin (NEURONTIN) 100 MG capsule TAKE 4 CAPSULES (400 MG) BY MOUTH IN THE MORNING AND TAKE 3 CAPSULES BY MOUTH IN THE EVENING. 630 capsule 1   meloxicam (MOBIC) 7.5 MG tablet Take 7.5 mg by mouth daily.     trolamine salicylate (ASPERCREME) 10 % cream Apply 1 application topically as needed for muscle pain.     No current facility-administered medications on file prior to visit.     ROS see history of present illness  Objective  Physical Exam Vitals:   05/02/23 1009  BP: 122/78  Pulse: (!) 106  Temp: 98.3 F (36.8 C)  SpO2: 93%    BP Readings from Last 3 Encounters:  05/02/23 122/78  02/13/23 (!) 148/86  01/17/23 (!) 153/85   Wt Readings from Last 3 Encounters:  05/02/23 224 lb 3.2 oz (101.7 kg)  02/13/23 216 lb (98 kg)  09/01/22 218 lb (98.9 kg)    Physical Exam Constitutional:      General: She is not in acute distress.    Appearance: She is not diaphoretic.   Cardiovascular:     Rate and Rhythm: Regular rhythm. Tachycardia present.     Heart sounds: Normal heart sounds.  Pulmonary:     Effort: Pulmonary effort is normal.     Breath sounds: Normal breath sounds.  Skin:    General: Skin is warm and dry.  Neurological:     Mental Status: She is alert.      Assessment/Plan: Please see individual problem list.  Primary hypertension Assessment & Plan: Chronic issue.  Adequately controlled.  Patient will continue amlodipine 5 mg daily.   Right flank pain Assessment & Plan: Chronic issue.  Adequately managed at this time.  She will continue gabapentin 400 mg in the morning and 300 mg in the evening.  She can continue tramadol 100 mg every 8 hours as needed for pain.  Controlled substance database reviewed.  Refill provided.  Orders: -     traMADol HCl; Take 2 tablets (100 mg total) by mouth every 8 (eight) hours as needed. for pain  Dispense: 42 tablet; Refill: 1  Hyperlipidemia, unspecified hyperlipidemia type -     Lipid panel -     Comprehensive metabolic panel  Class 2 severe obesity due to excess calories with serious comorbidity and body mass index (BMI) of 36.0 to 36.9 in adult [facility]) -  Hemoglobin A1c     Return in about 6 months (around 10/30/2023) for transfer of care.   Marikay Alar, MD [facility]

## 2023-05-02 NOTE — Assessment & Plan Note (Signed)
Chronic issue.  Adequately controlled.  Patient will continue amlodipine 5 mg daily.

## 2023-06-18 ENCOUNTER — Other Ambulatory Visit: Payer: Self-pay | Admitting: Family Medicine

## 2023-07-04 DIAGNOSIS — Z6841 Body Mass Index (BMI) 40.0 and over, adult: Secondary | ICD-10-CM | POA: Diagnosis not present

## 2023-07-04 DIAGNOSIS — R195 Other fecal abnormalities: Secondary | ICD-10-CM | POA: Diagnosis not present

## 2023-07-22 ENCOUNTER — Other Ambulatory Visit: Payer: Self-pay | Admitting: Family Medicine

## 2023-07-25 ENCOUNTER — Other Ambulatory Visit: Payer: Self-pay | Admitting: Family Medicine

## 2023-08-31 ENCOUNTER — Other Ambulatory Visit: Payer: Self-pay | Admitting: Family Medicine

## 2023-08-31 NOTE — Telephone Encounter (Signed)
 Copied from CRM [id number]. Topic: Clinical - Medication Refill >> Aug 31, 2023 12:54 PM Isabell A wrote: Most Recent Primary Care Visit:  Provider: Birdie Sons ERIC G  Department: [facility]  Visit Type: OFFICE VISIT  Date: 05/02/2023  Medication: gabapentin (NEURONTIN) 100 MG capsule  Has the patient contacted their pharmacy? Yes (Agent: If no, request that the patient contact the pharmacy for the refill. If patient does not wish to contact the pharmacy document the reason why and proceed with request.) (Agent: If yes, when and what did the pharmacy advise?)  Is this the correct pharmacy for this prescription? Yes If no, delete pharmacy and type the correct one.  This is the patient's preferred pharmacy:  [id number] [place], [place] - [address] [place] [place] [place] [address] Phone: [phone] Fax: [phone]   Has the prescription been filled recently? Yes  Is the patient out of the medication? No  Has the patient been seen for an appointment in the last year OR does the patient have an upcoming appointment? Yes  Can we respond through MyChart? No  Agent: Please be advised that Rx refills may take up to 3 business days. We ask that you follow-up with your pharmacy.

## 2023-09-01 MED ORDER — GABAPENTIN 100 MG PO CAPS: ORAL_CAPSULE | ORAL | 0 refills | Status: DC

## 2023-09-01 NOTE — Telephone Encounter (Signed)
 Copied from CRM [id number]. Topic: Clinical - Prescription Issue >> Sep 01, 2023 11:16 AM Elizebeth Brooking wrote: Reason for CRM: Patient called stated that the pharmacy stated that she will need a new prescription for her medication gabapentin (NEURONTIN) 100 MG capsule not a refill. Patient stated she needs this because she will be taking her last pill tonight

## 2023-09-07 ENCOUNTER — Other Ambulatory Visit: Payer: Self-pay | Admitting: Student

## 2023-09-07 DIAGNOSIS — M5417 Radiculopathy, lumbosacral region: Secondary | ICD-10-CM

## 2023-09-07 DIAGNOSIS — M47816 Spondylosis without myelopathy or radiculopathy, lumbar region: Secondary | ICD-10-CM

## 2023-09-07 DIAGNOSIS — M5416 Radiculopathy, lumbar region: Secondary | ICD-10-CM

## 2023-09-15 NOTE — Discharge Instructions (Signed)

## 2023-09-18 ENCOUNTER — Ambulatory Visit
Admission: RE | Admit: 2023-09-18 | Discharge: 2023-09-18 | Disposition: A | Discharge status: Home / Self Care | Source: Ambulatory Visit | Attending: Student

## 2023-09-18 DIAGNOSIS — M5416 Radiculopathy, lumbar region: Secondary | ICD-10-CM

## 2023-09-18 DIAGNOSIS — M5417 Radiculopathy, lumbosacral region: Secondary | ICD-10-CM

## 2023-09-18 DIAGNOSIS — M47816 Spondylosis without myelopathy or radiculopathy, lumbar region: Secondary | ICD-10-CM

## 2023-09-18 MED ORDER — METHYLPREDNISOLONE ACETATE 40 MG/ML INJ SUSP (RADIOLOG
80.0000 mg | Freq: Once | INTRAMUSCULAR | Status: AC
  Administered 2023-09-18: 80 mg via EPIDURAL

## 2023-09-18 MED ORDER — IOPAMIDOL (ISOVUE-M 200) INJECTION 41%
1.0000 mL | Freq: Once | INTRAMUSCULAR | Status: AC
  Administered 2023-09-18: 1 mL via EPIDURAL

## 2023-10-11 ENCOUNTER — Encounter

## 2023-10-17 ENCOUNTER — Ambulatory Visit (INDEPENDENT_AMBULATORY_CARE_PROVIDER_SITE_OTHER): Admitting: *Deleted

## 2023-10-17 VITALS — Ht 65.0 in | Wt 220.0 lb

## 2023-10-17 DIAGNOSIS — Z0000 Encounter for general adult medical examination without abnormal findings: Secondary | ICD-10-CM

## 2023-10-17 NOTE — Progress Notes (Signed)
 Subjective:   Alice Campbell is a 73 y.o. who presents for a Medicare Wellness preventive visit.  Visit Complete: Virtual I connected with  Alice Campbell on 10/17/23 by a audio enabled telemedicine application and verified that I am speaking with the correct person using two identifiers.  Patient Location: Home  Provider Location: Home Office  I discussed the limitations of evaluation and management by telemedicine. The patient expressed understanding and agreed to proceed.  Vital Signs: Because this visit was a virtual/telehealth visit, some criteria may be missing or patient reported. Any vitals not documented were not able to be obtained and vitals that have been documented are patient reported.  VideoDeclined- This patient declined Librarian, academic. Therefore the visit was completed with audio only.  Persons Participating in Visit: Patient.  AWV Questionnaire: Yes: Patient Medicare AWV questionnaire was completed by the patient on 10/16/23; I have confirmed that all information answered by patient is correct and no changes since this date.  Cardiac Risk Factors include: advanced age (>64men, >7 women);dyslipidemia;obesity (BMI >30kg/m2);hypertension     Objective:    Today's Vitals   10/17/23 1304  Weight: 220 lb (99.8 kg)  Height: 5\' 5"  (1.651 m)   Body mass index is 36.61 kg/m.     10/17/2023    1:14 PM 02/13/2023    1:06 PM 09/01/2022    2:02 PM 08/30/2021    1:36 PM 08/27/2020   12:04 PM 07/23/2019   12:14 PM 02/11/2019    4:28 PM  Advanced Directives  Does Patient Have a Medical Advance Directive? No No No No No No No  Would patient like information on creating a medical advance directive? No - Patient declined  No - Patient declined No - Patient declined No - Patient declined No - Patient declined No - Patient declined    Current Medications (verified) Outpatient Encounter Medications as of 10/17/2023  Medication Sig   acetaminophen   (TYLENOL ) 500 MG tablet Take 500 mg by mouth every 6 (six) hours as needed.   amLODipine  (NORVASC ) 5 MG tablet TAKE 1 TABLET BY MOUTH EVERY DAY   atorvastatin  (LIPITOR) 20 MG tablet TAKE 1 TABLET BY MOUTH EVERY DAY   colestipol (COLESTID) 1 g tablet Take 1 g by mouth 2 (two) times daily.   gabapentin  (NEURONTIN ) 100 MG capsule TAKE 4 CAPSULES (400 MG) BY MOUTH IN THE MORNING AND TAKE 3 CAPSULES BY MOUTH IN THE EVENING.   meloxicam (MOBIC) 7.5 MG tablet Take 7.5 mg by mouth daily.   traMADol  (ULTRAM ) 50 MG tablet Take 2 tablets (100 mg total) by mouth every 8 (eight) hours as needed. for pain   trolamine salicylate (ASPERCREME) 10 % cream Apply 1 application topically as needed for muscle pain.   No facility-administered encounter medications on file as of 10/17/2023.    Allergies (verified) Patient has no known allergies.   History: Past Medical History:  Diagnosis Date   Arthritis    Bulging lumbar disc    Chicken pox    GERD (gastroesophageal reflux disease)    History of UTI    [facility]   Hyperlipidemia    Hypertension    Psoriasis    Past Surgical History:  Procedure Laterality Date   BIOPSY  02/13/2023   Procedure: BIOPSY;  Surgeon: Quintin Buckle, DO;  Location: [facility] ENDOSCOPY;  Service: Gastroenterology;;   BIOPSY THYROID   07/2013   CHOLECYSTECTOMY  2011   COLONOSCOPY WITH PROPOFOL  N/A 02/13/2023   Procedure: COLONOSCOPY  WITH PROPOFOL ;  Surgeon: Quintin Buckle, DO;  Location: [facility] ENDOSCOPY;  Service: Gastroenterology;  Laterality: N/A;   FRACTURE SURGERY Right    Hip   KNEE ARTHROPLASTY Left 02/11/2019   Procedure: COMPUTER ASSISTED TOTAL KNEE ARTHROPLASTY - RNFA;  Surgeon: Arlyne Lame, MD;  Location: [facility] ORS;  Service: Orthopedics;  Laterality: Left;   POLYPECTOMY  02/13/2023   Procedure: POLYPECTOMY;  Surgeon: Quintin Buckle, DO;  Location: [facility] ENDOSCOPY;  Service: Gastroenterology;;   Family History  Problem Relation Age of Onset    Heart disease Mother    Hypertension Mother    Mental illness Mother        Nervous breakdown    Cancer Father        Prostate Cancer   Heart disease Father    Hypertension Father    Diabetes Son    Breast cancer Neg Hx    Social History   Socioeconomic History   Marital status: Married    Spouse name: Not on file   Number of children: Not on file   Years of education: 12   Highest education level: 12th grade  Occupational History   Occupation: Paramedic Work    Comment: Semi-retired  Tobacco Use   Smoking status: Former    Current packs/day: 0.00    Types: Cigarettes    Quit date: 01/19/1996    Years since quitting: 27.7   Smokeless tobacco: Never   Tobacco comments:    quit in 2000  Vaping Use   Vaping status: Never Used  Substance and Sexual Activity   Alcohol use: Yes    Comment: 1-2 drinks per month   Drug use: No   Sexual activity: Yes  Other Topics Concern   Not on file  Social History Narrative   Alice Campbell grew up in [place], [place]. She is currently living in [place] with her husband. She enjoys swimming and reading.   Social Drivers of Corporate investment banker Strain: Low Risk  (10/16/2023)   Overall Financial Resource Strain (CARDIA)    Difficulty of Paying Living Expenses: Not very hard  Food Insecurity: No Food Insecurity (10/16/2023)   Hunger Vital Sign    Worried About Running Out of Food in the Last Year: Never true    Ran Out of Food in the Last Year: Never true  Transportation Needs: No Transportation Needs (10/16/2023)   PRAPARE - Administrator, Civil Service (Medical): No    Lack of Transportation (Non-Medical): No  Physical Activity: Inactive (10/16/2023)   Exercise Vital Sign    Days of Exercise per Week: 0 days    Minutes of Exercise per Session: 0 min  Stress: Stress Concern Present (10/16/2023)   [employer] of Occupational Health - Occupational Stress Questionnaire    Feeling of Stress : To some extent  Social  Connections: Socially Integrated (10/16/2023)   Social Connection and Isolation Panel [NHANES]    Frequency of Communication with Friends and Family: More than three times a week    Frequency of Social Gatherings with Friends and Family: Twice a week    Attends Religious Services: More than 4 times per year    Active Member of [employer] or Organizations: Yes    Attends Engineer, structural: More than 4 times per year    Marital Status: Married    Tobacco Counseling Counseling given: Not Answered Tobacco comments: quit in 2000    Clinical Intake:  Pre-visit preparation completed: Yes  Pain :  No/denies pain     BMI - recorded: 36.61 Nutritional Status: BMI > 30  Obese Nutritional Risks: None Diabetes: No  Lab Results  Component Value Date   HGBA1C 5.4 05/02/2023   HGBA1C 5.5 10/27/2021   HGBA1C 5.2 04/24/2017     How often do you need to have someone help you when you read instructions, pamphlets, or other written materials from your doctor or pharmacy?: 1 - Never  Interpreter Needed?: No  Information entered by :: R. Auden Wettstein LPN   Activities of Daily Living     10/16/2023    5:00 PM  In your present state of health, do you have any difficulty performing the following activities:  Hearing? 1  Comment wears aids  Vision? 0  Comment readers  Difficulty concentrating or making decisions? 0  Walking or climbing stairs? 1  Dressing or bathing? 0  Doing errands, shopping? 0  Preparing Food and eating ? N  Using the Toilet? N  In the past six months, have you accidently leaked urine? Y  Do you have problems with loss of bowel control? N  Managing your Medications? N  Managing your Finances? N  Housekeeping or managing your Housekeeping? Y    No care team member to display  Indicate any recent Medical Services you may have received from other than [facility] providers in the past year (date may be approximate).     Assessment:   This is a routine wellness  examination for Alice Campbell.  Hearing/Vision screen Hearing Screening - Comments:: Wears aids Vision Screening - Comments:: readers   Goals Addressed             This Visit's Progress    Patient Stated       Wants to be more active       Depression Screen     10/17/2023    1:10 PM 05/02/2023   10:23 AM 09/01/2022    2:04 PM 07/26/2022   10:02 AM 08/30/2021    1:26 PM 08/27/2020   12:05 PM 07/23/2020    9:03 AM  PHQ 2/9 Scores  PHQ - 2 Score 0 0 0 0 0 0 0  PHQ- 9 Score 4 4         Fall Risk     10/16/2023    5:00 PM 05/02/2023   10:23 AM 09/01/2022    2:03 PM 07/26/2022   10:02 AM 08/30/2021    1:30 PM  Fall Risk   Falls in the past year? 0 0 0 0 0  Number falls in past yr: 0  0 0   Injury with Fall? 0 0 0 0   Risk for fall due to : No Fall Risks No Fall Risks -- No Fall Risks   Risk for fall due to: Comment   Cane in use    Follow up Falls prevention discussed;Falls evaluation completed Falls evaluation completed Falls evaluation completed;Falls prevention discussed Falls evaluation completed Falls evaluation completed    MEDICARE RISK AT HOME:  Medicare Risk at Home Any stairs in or around the home?: (Patient-Rptd) Yes If so, are there any without handrails?: (Patient-Rptd) No Home free of loose throw rugs in walkways, pet beds, electrical cords, etc?: (Patient-Rptd) Yes Adequate lighting in your home to reduce risk of falls?: (Patient-Rptd) Yes Life alert?: (Patient-Rptd) No Use of a cane, walker or w/c?: (Patient-Rptd) Yes Grab bars in the bathroom?: (Patient-Rptd) No Shower chair or bench in shower?: (Patient-Rptd) Yes Elevated toilet seat or a handicapped toilet?: (  Patient-Rptd) Yes  TIMED UP AND GO:  Was the test performed?  No  Cognitive Function: 6CIT completed    07/18/2018    1:45 PM  MMSE - Mini Mental State Exam  Orientation to time 5  Orientation to Place 5  Registration 3  Attention/ Calculation 5  Recall 3  Language- name 2 objects 2  Language-  repeat 1  Language- follow 3 step command 3  Language- read & follow direction 1  Write a sentence 1  Copy design 1  Total score 30        10/17/2023    1:15 PM 09/01/2022    2:15 PM 08/30/2021    1:37 PM 07/23/2019   12:28 PM  6CIT Screen  What Year? 0 points 0 points 0 points 0 points  What month? 0 points 0 points 0 points 0 points  What time? 0 points 0 points 0 points 0 points  Count back from 20 0 points 0 points 0 points 0 points  Months in reverse 0 points 0 points 0 points 0 points  Repeat phrase 0 points 0 points 0 points 0 points  Total Score 0 points 0 points 0 points 0 points    Immunizations Immunization History  Administered Date(s) Administered   Fluad Quad(high Dose 65+) 03/20/2019, 05/12/2022   Influenza, High Dose Seasonal PF 04/17/2017, 04/15/2018, 03/20/2020, 04/20/2021   Influenza,inj,Quad PF,6+ Mos 04/24/2017   PFIZER(Purple Top)SARS-COV-2 Vaccination 08/08/2019, 08/29/2019, 03/27/2020   PNEUMOCOCCAL CONJUGATE-20 10/12/2020   Pfizer Covid-19 Vaccine Bivalent Booster 59yrs & up 06/15/2021   Pneumococcal Polysaccharide-23 07/03/2018   Td 12/09/2013   Zoster Recombinant(Shingrix) 12/24/2020, 04/20/2021   Zoster, Live 12/24/2013    Screening Tests Health Maintenance  Topic Date Due   DEXA SCAN  Never done   MAMMOGRAM  07/20/2022   COVID-19 Vaccine (5 - 2024-25 season) 02/26/2023   DTaP/Tdap/Td (2 - Tdap) 12/10/2023   INFLUENZA VACCINE  01/26/2024   Medicare Annual Wellness (AWV)  10/16/2024   Colonoscopy  02/12/2033   Pneumonia Vaccine 79+ Years old  Completed   Hepatitis C Screening  Completed   Zoster Vaccines- Shingrix  Completed   HPV VACCINES  Aged Out   Meningococcal B Vaccine  Aged Out    Health Maintenance  Health Maintenance Due  Topic Date Due   DEXA SCAN  Never done   MAMMOGRAM  07/20/2022   COVID-19 Vaccine (5 - 2024-25 season) 02/26/2023   Health Maintenance Items Addressed: Patient declines mammogram and bone density. Patient  stated that she will discuss with her PCP at next visit.  Additional Screening:  Vision Screening: Recommended annual ophthalmology exams for early detection of glaucoma and other disorders of the eye.Needs eye examination. Patient stated that she will call [facility] and schedule an appointment.  Dental Screening: Recommended annual dental exams for proper oral hygiene  Community Resource Referral / Chronic Care Management: CRR required this visit?  No   CCM required this visit?  No     Plan:     I have personally reviewed and noted the following in the patient's chart:   Medical and social history Use of alcohol, tobacco or illicit drugs  Current medications and supplements including opioid prescriptions. Patient is currently taking opioid prescriptions. Functional ability and status Nutritional status Physical activity Advanced directives List of other physicians Hospitalizations, surgeries, and ER visits in previous 12 months Vitals Screenings to include cognitive, depression, and falls Referrals and appointments  In addition, I have reviewed and discussed with patient  certain preventive protocols, quality metrics, and best practice recommendations. A written personalized care plan for preventive services as well as general preventive health recommendations were provided to patient.     Felicitas Horse, LPN   10/03/8117   After Visit Summary: (MyChart) Due to this being a telephonic visit, the after visit summary with patients personalized plan was offered to patient via MyChart   Notes: Nothing significant to report at this time.

## 2023-10-17 NOTE — Patient Instructions (Signed)
 Alice Campbell , Thank you for taking time to come for your Medicare Wellness Visit. I appreciate your ongoing commitment to your health goals. Please review the following plan we discussed and let me know if I can assist you in the future.   Referrals/Orders/Follow-Ups/Clinician Recommendations: Remember to call and schedule an eye appointment.  This is a list of the screening recommended for you and due dates:  Health Maintenance  Topic Date Due   DEXA scan (bone density measurement)  Never done   Mammogram  07/20/2022   COVID-19 Vaccine (5 - 2024-25 season) 02/26/2023   DTaP/Tdap/Td vaccine (2 - Tdap) 12/10/2023   Flu Shot  01/26/2024   Medicare Annual Wellness Visit  10/16/2024   Colon Cancer Screening  02/12/2033   Pneumonia Vaccine  Completed   Hepatitis C Screening  Completed   Zoster (Shingles) Vaccine  Completed   HPV Vaccine  Aged Out   Meningitis B Vaccine  Aged Out    Advanced directives: (Declined) Advance directive discussed with you today. Even though you declined this today, please call our office should you change your mind, and we can give you the proper paperwork for you to fill out.  Next Medicare Annual Wellness Visit scheduled for next year: Yes 10/23/24 @ 10:50 Managing Pain Without Opioids Opioids are strong medicines used to treat moderate to severe pain. For some people, especially those who have long-term (chronic) pain, opioids may not be the best choice for pain management due to: Side effects like nausea, constipation, and sleepiness. The risk of addiction (opioid use disorder). The longer you take opioids, the greater your risk of addiction. Pain that lasts for more than 3 months is called chronic pain. Managing chronic pain usually requires more than one approach and is often provided by a team of health care providers working together (multidisciplinary approach). Pain management may be done at a pain management center or pain clinic. How to manage pain  without the use of opioids Use non-opioid medicines Non-opioid medicines for pain may include: Over-the-counter or prescription non-steroidal anti-inflammatory drugs (NSAIDs). These may be the first medicines used for pain. They work well for muscle and bone pain, and they reduce swelling. Acetaminophen . This over-the-counter medicine may work well for milder pain but not swelling. Antidepressants. These may be used to treat chronic pain. A certain type of antidepressant (tricyclics) is often used. These medicines are given in lower doses for pain than when used for depression. Anticonvulsants. These are usually used to treat seizures but may also reduce nerve (neuropathic) pain. Muscle relaxants. These relieve pain caused by sudden muscle tightening (spasms). You may also use a pain medicine that is applied to the skin as a patch, cream, or gel (topical analgesic), such as a numbing medicine. These may cause fewer side effects than medicines taken by mouth. Do certain therapies as directed Some therapies can help with pain management. They include: Physical therapy. You will do exercises to gain strength and flexibility. A physical therapist may teach you exercises to move and stretch parts of your body that are weak, stiff, or painful. You can learn these exercises at physical therapy visits and practice them at home. Physical therapy may also involve: Massage. Heat wraps or applying heat or cold to affected areas. Electrical signals that interrupt pain signals (transcutaneous electrical nerve stimulation, TENS). Weak lasers that reduce pain and swelling (low-level laser therapy). Signals from your body that help you learn to regulate pain (biofeedback). Occupational therapy. This helps you to learn  ways to function at home and work with less pain. Recreational therapy. This involves trying new activities or hobbies, such as a physical activity or drawing. Mental health therapy,  including: Cognitive behavioral therapy (CBT). This helps you learn coping skills for dealing with pain. Acceptance and commitment therapy (ACT) to change the way you think and react to pain. Relaxation therapies, including muscle relaxation exercises and mindfulness-based stress reduction. Pain management counseling. This may be individual, family, or group counseling.  Receive medical treatments Medical treatments for pain management include: Nerve block injections. These may include a pain blocker and anti-inflammatory medicines. You may have injections: Near the spine to relieve chronic back or neck pain. Into joints to relieve back or joint pain. Into nerve areas that supply a painful area to relieve body pain. Into muscles (trigger point injections) to relieve some painful muscle conditions. A medical device placed near your spine to help block pain signals and relieve nerve pain or chronic back pain (spinal cord stimulation device). Acupuncture. Follow these instructions at home Medicines Take over-the-counter and prescription medicines only as told by your health care provider. If you are taking pain medicine, ask your health care providers about possible side effects to watch out for. Do not drive or use heavy machinery while taking prescription opioid pain medicine. Lifestyle  Do not use drugs or alcohol to reduce pain. If you drink alcohol, limit how much you have to: 0-1 drink a day for women who are not pregnant. 0-2 drinks a day for men. Know how much alcohol is in a drink. In the [place] one drink equals one 12 oz bottle of beer (355 mL), one 5 oz glass of wine (148 mL), or one 1 oz glass of hard liquor (44 mL). Do not use any products that contain nicotine or tobacco. These products include cigarettes, chewing tobacco, and vaping devices, such as e-cigarettes. If you need help quitting, ask your health care provider. Eat a healthy diet and maintain a healthy weight. Poor  diet and excess weight may make pain worse. Eat foods that are high in fiber. These include fresh fruits and vegetables, whole grains, and beans. Limit foods that are high in fat and processed sugars, such as fried and sweet foods. Exercise regularly. Exercise lowers stress and may help relieve pain. Ask your health care provider what activities and exercises are safe for you. If your health care provider approves, join an exercise class that combines movement and stress reduction. Examples include yoga and tai chi. Get enough sleep. Lack of sleep may make pain worse. Lower stress as much as possible. Practice stress reduction techniques as told by your therapist. General instructions Work with all your pain management providers to find the treatments that work best for you. You are an important member of your pain management team. There are many things you can do to reduce pain on your own. Consider joining an online or in-person support group for people who have chronic pain. Keep all follow-up visits. This is important. Where to find more information You can find more information about managing pain without opioids from: American Academy of Pain Medicine: painmed.org Institute for Chronic Pain: instituteforchronicpain.org American Chronic Pain Association: theacpa.org Contact a health care provider if: You have side effects from pain medicine. Your pain gets worse or does not get better with treatments or home therapy. You are struggling with anxiety or depression. Summary Many types of pain can be managed without opioids. Chronic pain may respond better to pain  management without opioids. Pain is best managed when you and a team of health care providers work together. Pain management without opioids may include non-opioid medicines, medical treatments, physical therapy, mental health therapy, and lifestyle changes. Tell your health care providers if your pain gets worse or is not being  managed well enough. This information is not intended to replace advice given to you by your health care provider. Make sure you discuss any questions you have with your health care provider. Document Revised: 09/23/2020 Document Reviewed: 09/23/2020  Elsevier Patient Education  2024 [employer].

## 2023-10-30 ENCOUNTER — Other Ambulatory Visit: Payer: Self-pay | Admitting: *Deleted

## 2023-10-30 DIAGNOSIS — R109 Unspecified abdominal pain: Secondary | ICD-10-CM

## 2023-10-30 NOTE — Telephone Encounter (Unsigned)
 Copied from CRM [id number]. Topic: Clinical - Medication Refill >> Oct 30, 2023 10:13 AM Annelle Kiel wrote: Most Recent Primary Care Visit:  Provider: Felicitas Horse C  Department: [facility]  Visit Type: MEDICARE AWV, SEQUENTIAL  Date: 10/17/2023  Medication: traMADol  (ULTRAM ) 50 MG tablet  Has the patient contacted their pharmacy? Yes (Agent: If no, request that the patient contact the pharmacy for the refill. If patient does not wish to contact the pharmacy document the reason why and proceed with request.) (Agent: If yes, when and what did the pharmacy advise?)  Is this the correct pharmacy for this prescription? Yes If no, delete pharmacy and type the correct one.  This is the patient's preferred pharmacy:  [id number] [place], [place] - [address] [place] [place] [place] [address] Phone: [phone] Fax: [phone]   Has the prescription been filled recently? No  Is the patient out of the medication? Yes  Has the patient been seen for an appointment in the last year OR does the patient have an upcoming appointment? Yes  Can we respond through MyChart? No  Agent: Please be advised that Rx refills may take up to 3 business days. We ask that you follow-up with your pharmacy.

## 2023-10-31 MED ORDER — TRAMADOL HCL 50 MG PO TABS
100.0000 mg | ORAL_TABLET | Freq: Three times a day (TID) | ORAL | 0 refills | Status: DC | PRN
Start: 2023-10-31 — End: 2024-02-01

## 2023-11-01 ENCOUNTER — Ambulatory Visit: Payer: PPO | Admitting: Family Medicine

## 2023-11-06 ENCOUNTER — Ambulatory Visit (INDEPENDENT_AMBULATORY_CARE_PROVIDER_SITE_OTHER): Admitting: Family Medicine

## 2023-11-06 ENCOUNTER — Encounter: Payer: Self-pay | Admitting: Family Medicine

## 2023-11-06 VITALS — BP 136/82 | HR 65 | Temp 98.6°F | Resp 20 | Ht 65.0 in | Wt 224.2 lb

## 2023-11-06 DIAGNOSIS — Z1231 Encounter for screening mammogram for malignant neoplasm of breast: Secondary | ICD-10-CM

## 2023-11-06 DIAGNOSIS — Z780 Asymptomatic menopausal state: Secondary | ICD-10-CM

## 2023-11-06 DIAGNOSIS — E785 Hyperlipidemia, unspecified: Secondary | ICD-10-CM

## 2023-11-06 DIAGNOSIS — M25551 Pain in right hip: Secondary | ICD-10-CM | POA: Diagnosis not present

## 2023-11-06 DIAGNOSIS — I10 Essential (primary) hypertension: Secondary | ICD-10-CM

## 2023-11-06 MED ORDER — AMLODIPINE BESYLATE 5 MG PO TABS
5.0000 mg | ORAL_TABLET | Freq: Every day | ORAL | 3 refills | Status: AC
Start: 2023-11-06 — End: ?

## 2023-11-06 MED ORDER — ATORVASTATIN CALCIUM 20 MG PO TABS
20.0000 mg | ORAL_TABLET | Freq: Every day | ORAL | 3 refills | Status: AC
Start: 2023-11-06 — End: ?

## 2023-11-06 NOTE — Progress Notes (Unsigned)
 SUBJECTIVE:   Chief Complaint  Patient presents with   Medical Management of Chronic Issues    6 month follow up   HPI Patient presents for follow up chronic disease management  Discussed the use of AI scribe software for clinical note transcription with the patient, who gave verbal consent to proceed.  History of Present Illness Alice Campbell "[name]" is a 73 year old female who presents for a six-month follow-up and transfer of care.  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 She experiences episodes of pain approximately four to five times a month, during which she takes tramadol  as needed, typically two tablets every eight hours for two to three days. She receives 42 tablets per month, which is sufficient for her needs.  She also has arthritis in her hip and takes meloxicam daily, although she cannot discern its effectiveness. She takes gabapentin , four capsules in the morning and three at night, which she believes helps with her back pain. Gabapentin  makes her sleepy but does not cause tingling in her legs.  She receives injections for her back pain every three months, although the effectiveness varies. Her last injection did not last as long as previous ones, but she has experienced periods of two to three months without significant pain.  No regular use of Tylenol . No history of kidney issues or diabetes. No chest pain or dizziness, although she occasionally feels dizzy.     PERTINENT PMH / PSH: As above  OBJECTIVE:  BP 136/82   Pulse 65   Temp 98.6 F (37 C)   Resp 20   Ht 5\' 5"  (1.651 m)   Wt 224 lb 4 oz (101.7 kg)   SpO2 98%   BMI 37.32 kg/m    Physical Exam Vitals reviewed.  Constitutional:      General: She is not in acute distress.    Appearance: Normal appearance. She is obese. She is not ill-appearing, toxic-appearing or diaphoretic.  Eyes:     General:        Right  eye: No discharge.        Left eye: No discharge.     Conjunctiva/sclera: Conjunctivae normal.  Cardiovascular:     Rate and Rhythm: Normal rate and regular rhythm.     Heart sounds: Normal heart sounds.  Pulmonary:     Effort: Pulmonary effort is normal.     Breath sounds: Normal breath sounds.  Abdominal:     General: Bowel sounds are normal.  Musculoskeletal:        General: Normal range of motion.  Skin:    General: Skin is warm and dry.  Neurological:     General: No focal deficit present.     Mental Status: She is alert and oriented to person, place, and time. Mental status is at baseline.  Psychiatric:        Mood and Affect: Mood normal.        Behavior: Behavior normal.        Thought Content: Thought content normal.        Judgment: Judgment normal.             11/06/2023   10:25 AM 10/17/2023    1:10 PM 05/02/2023   10:23 AM 09/01/2022    2:04 PM 07/26/2022   10:02 AM  Depression screen PHQ 2/9  Decreased Interest 0 0 0 0 0  Down, Depressed, Hopeless 0 0 0 0 0  PHQ - 2 Score 0 0 0 0 0  Altered sleeping 1 1 1     Tired, decreased energy 3 3 3     Change in appetite 2 0 0    Feeling bad or failure about yourself  0 0 0    Trouble concentrating 0 0 0    Moving slowly or fidgety/restless 0 0 0    Suicidal thoughts 0 0 0    PHQ-9 Score 6 4 4     Difficult doing work/chores Somewhat difficult Not difficult at all Not difficult at all        11/06/2023   10:27 AM 05/02/2023   10:23 AM 07/26/2022   10:02 AM  GAD 7 : Generalized Anxiety Score  Nervous, Anxious, on Edge 0 0 0  Control/stop worrying 0 1 0  Worry too much - different things 1 1 0  Trouble relaxing 0 0 0  Restless 0 0 0  Easily annoyed or irritable 1 0 0  Afraid - awful might happen 0 0 0  Total GAD 7 Score 2 2 0  Anxiety Difficulty Not difficult at all Not difficult at all Not difficult at all    ASSESSMENT/PLAN:  Primary hypertension Assessment & Plan: Blood pressure well controlled today.    - Refill Amlodipine  5 mg daily - Monitor blood pressure regularly.  Orders: -     amLODIPine  Besylate; Take 1 tablet (5 mg total) by mouth daily.  Dispense: 90 tablet; Refill: 3  Hyperlipidemia, unspecified hyperlipidemia type Assessment & Plan: Refill Lipitor   Orders: -     Atorvastatin  Calcium ; Take 1 tablet (20 mg total) by mouth daily.  Dispense: 90 tablet; Refill: 3  Right hip pain Assessment & Plan: Arthritis in the hip. Meloxicam not effective in pain management. Discussed potential kidney harm from meloxicam and proposed trial of regular acetaminophen  use instead. - Initiate regular acetaminophen  use, one tablet every six hours. - Discontinue meloxicam after 2-3 days of acetaminophen  use. - Follow up in two weeks to assess pain management and medication efficacy.   Breast cancer screening by mammogram -     3D Screening Mammogram, Left and Right; Future  Postmenopausal estrogen deficiency -     DG Bone Density; Future     PDMP reviewed  Return in about 2 weeks (around 11/20/2023) for PCP.  Valli Gaw, MD

## 2023-11-06 NOTE — Patient Instructions (Addendum)
 It was a pleasure meeting you today. Thank you for allowing me to take part in your health care.  Our goals for today as we discussed include:  Start Tylenol  500 mg three times a day for arthritis Stop Meloxicam in 3 days Follow up in 2 weeks   Referral sent for Mammogram and Dexa scan. Please call to schedule appointment. [facility] [address] [place], [place] [address] [phone]     This is a list of the screening recommended for you and due dates:  Health Maintenance  Topic Date Due   DEXA scan (bone density measurement)  Never done   Mammogram  07/20/2021   COVID-19 Vaccine (5 - 2024-25 season) 02/26/2023   DTaP/Tdap/Td vaccine (2 - Tdap) 12/10/2023   Flu Shot  01/26/2024   Medicare Annual Wellness Visit  10/16/2024   Colon Cancer Screening  02/12/2033   Pneumonia Vaccine  Completed   Hepatitis C Screening  Completed   Zoster (Shingles) Vaccine  Completed   HPV Vaccine  Aged Out   Meningitis B Vaccine  Aged Out      If you have any questions or concerns, please do not hesitate to call the office at [phone].  I look forward to our next visit and until then take care and stay safe.  Regards,   Valli Gaw, MD   [facility]

## 2023-11-10 ENCOUNTER — Encounter: Payer: Self-pay | Admitting: Family Medicine

## 2023-11-10 DIAGNOSIS — M25551 Pain in right hip: Secondary | ICD-10-CM | POA: Insufficient documentation

## 2023-11-10 DIAGNOSIS — Z780 Asymptomatic menopausal state: Secondary | ICD-10-CM | POA: Insufficient documentation

## 2023-11-10 DIAGNOSIS — Z1231 Encounter for screening mammogram for malignant neoplasm of breast: Secondary | ICD-10-CM | POA: Insufficient documentation

## 2023-11-10 NOTE — Assessment & Plan Note (Signed)
Refill Lipitor

## 2023-11-10 NOTE — Assessment & Plan Note (Signed)
 Two bulging discs causing nerve compression, resulting in intermittent pain. Gabapentin  helps with back pain but causes drowsiness. Discussed potential reduction in gabapentin  dosage to minimize drowsiness while maintaining pain control. She will consider. Has been on Tramadol  for years. Previously prescribed by former PCP.  Takes 100 mg daily but prescribed q8h.  Reports having extra just in case having flare up. - Continue tramadol  as needed for pain. - Consider reducing gabapentin  dosage by one tablet in the morning and monitor pain levels. - Follow up in two weeks to reassess pain management and medication regimen.

## 2023-11-10 NOTE — Assessment & Plan Note (Signed)
 Arthritis in the hip. Meloxicam not effective in pain management. Discussed potential kidney harm from meloxicam and proposed trial of regular acetaminophen  use instead. - Initiate regular acetaminophen  use, one tablet every six hours. - Discontinue meloxicam after 2-3 days of acetaminophen  use. - Follow up in two weeks to assess pain management and medication efficacy.

## 2023-11-10 NOTE — Assessment & Plan Note (Addendum)
 Blood pressure well controlled today.   - Refill Amlodipine  5 mg daily - Monitor blood pressure regularly.

## 2023-11-21 ENCOUNTER — Other Ambulatory Visit: Payer: Self-pay | Admitting: Student

## 2023-11-21 ENCOUNTER — Ambulatory Visit: Admitting: Family Medicine

## 2023-11-21 ENCOUNTER — Encounter: Payer: Self-pay | Admitting: Family Medicine

## 2023-11-21 VITALS — BP 134/78 | HR 67 | Temp 98.6°F | Resp 20 | Ht 65.0 in | Wt 224.4 lb

## 2023-11-21 DIAGNOSIS — E66812 Obesity, class 2: Secondary | ICD-10-CM | POA: Diagnosis not present

## 2023-11-21 DIAGNOSIS — Z13820 Encounter for screening for osteoporosis: Secondary | ICD-10-CM

## 2023-11-21 DIAGNOSIS — I10 Essential (primary) hypertension: Secondary | ICD-10-CM | POA: Diagnosis not present

## 2023-11-21 DIAGNOSIS — E538 Deficiency of other specified B group vitamins: Secondary | ICD-10-CM | POA: Diagnosis not present

## 2023-11-21 DIAGNOSIS — M5417 Radiculopathy, lumbosacral region: Secondary | ICD-10-CM

## 2023-11-21 DIAGNOSIS — M5415 Radiculopathy, thoracolumbar region: Secondary | ICD-10-CM

## 2023-11-21 DIAGNOSIS — M1651 Unilateral post-traumatic osteoarthritis, right hip: Secondary | ICD-10-CM | POA: Diagnosis not present

## 2023-11-21 DIAGNOSIS — Z6836 Body mass index (BMI) 36.0-36.9, adult: Secondary | ICD-10-CM

## 2023-11-21 DIAGNOSIS — E559 Vitamin D deficiency, unspecified: Secondary | ICD-10-CM | POA: Diagnosis not present

## 2023-11-21 DIAGNOSIS — M47816 Spondylosis without myelopathy or radiculopathy, lumbar region: Secondary | ICD-10-CM

## 2023-11-21 LAB — CBC WITH DIFFERENTIAL/PLATELET
Basophils Absolute: 0 10*3/uL (ref 0.0–0.1)
Basophils Relative: 0.8 % (ref 0.0–3.0)
Eosinophils Absolute: 0.1 10*3/uL (ref 0.0–0.7)
Eosinophils Relative: 2.3 % (ref 0.0–5.0)
HCT: 45.9 % (ref 36.0–46.0)
Hemoglobin: 15.3 g/dL — ABNORMAL HIGH (ref 12.0–15.0)
Lymphocytes Relative: 38.4 % (ref 12.0–46.0)
Lymphs Abs: 2 10*3/uL (ref 0.7–4.0)
MCHC: 33.3 g/dL (ref 30.0–36.0)
MCV: 95.4 fl (ref 78.0–100.0)
Monocytes Absolute: 0.5 10*3/uL (ref 0.1–1.0)
Monocytes Relative: 8.8 % (ref 3.0–12.0)
Neutro Abs: 2.6 10*3/uL (ref 1.4–7.7)
Neutrophils Relative %: 49.7 % (ref 43.0–77.0)
Platelets: 186 10*3/uL (ref 150.0–400.0)
RBC: 4.81 Mil/uL (ref 3.87–5.11)
RDW: 13.6 % (ref 11.5–15.5)
WBC: 5.3 10*3/uL (ref 4.0–10.5)

## 2023-11-21 LAB — COMPREHENSIVE METABOLIC PANEL WITH GFR
ALT: 11 U/L (ref 0–35)
AST: 17 U/L (ref 0–37)
Albumin: 4.1 g/dL (ref 3.5–5.2)
Alkaline Phosphatase: 103 U/L (ref 39–117)
BUN: 10 mg/dL (ref 6–23)
CO2: 29 meq/L (ref 19–32)
Calcium: 9.4 mg/dL (ref 8.4–10.5)
Chloride: 104 meq/L (ref 96–112)
Creatinine, Ser: 0.84 mg/dL (ref 0.40–1.20)
GFR: 68.74 mL/min (ref >60.00)
Glucose, Bld: 81 mg/dL (ref 70–99)
Potassium: 4.2 meq/L (ref 3.5–5.1)
Sodium: 139 meq/L (ref 135–145)
Total Bilirubin: 0.7 mg/dL (ref 0.2–1.2)
Total Protein: 6.7 g/dL (ref 6.0–8.3)

## 2023-11-21 LAB — VITAMIN D 25 HYDROXY (VIT D DEFICIENCY, FRACTURES): VITD: 17.03 ng/mL — ABNORMAL LOW (ref 30.00–100.00)

## 2023-11-21 LAB — VITAMIN B12: Vitamin B-12: 309 pg/mL (ref 211–911)

## 2023-11-21 NOTE — Progress Notes (Signed)
 SUBJECTIVE:   Chief Complaint  Patient presents with   Hypertension   HPI Presents for follow up chronic disease management  Discussed the use of AI scribe software for clinical note transcription with the patient, who gave verbal consent to proceed.  History of Present Illness Alice Campbell "Alice Campbell" is a 73 year old female who presents for follow-up regarding pain management.  She was switched from meloxicam to Tylenol  for pain management but has not noticed any difference in her pain levels. She takes Tylenol  500 mg two to three times a day, often missing the midday dose. Her pain has been particularly severe in the last week, and she is due for a pain-relieving injection.  She uses tramadol  as needed for severe pain, taking two to three tablets for relief. One tablet is ineffective, and sometimes two tablets are insufficient, requiring three tablets. She avoids taking more than three tablets due to previous experiences of feeling 'funny' after taking four tablets. Heat therapy, such as showers, is also used to manage her pain.  She is on gabapentin  as prescribed for nerve pain caused by two bulging discs pressing on a nerve. She receives x-ray guided injections near the spine for pain management. Occasionally, she feels the injection in her groin, which she associates with effective pain relief.  She has not had a bone density test and is not currently on vitamin D , although it was prescribed in the past. No issues with blood pressure, heart rate, or breathing.     PERTINENT PMH / PSH: As above  OBJECTIVE:  BP 134/78   Pulse 67   Temp 98.6 F (37 C)   Resp 20   Ht 5\' 5"  (1.651 m)   Wt 224 lb 6 oz (101.8 kg)   SpO2 99%   BMI 37.34 kg/m    Physical Exam Vitals reviewed.  Constitutional:      General: She is not in acute distress.    Appearance: Normal appearance. She is obese. She is not ill-appearing, toxic-appearing or diaphoretic.  Eyes:     General:        Right  eye: No discharge.        Left eye: No discharge.     Conjunctiva/sclera: Conjunctivae normal.  Cardiovascular:     Rate and Rhythm: Normal rate and regular rhythm.     Heart sounds: Normal heart sounds.  Pulmonary:     Effort: Pulmonary effort is normal.     Breath sounds: Normal breath sounds.  Abdominal:     General: Bowel sounds are normal.  Musculoskeletal:        General: Normal range of motion.  Skin:    General: Skin is warm and dry.  Neurological:     General: No focal deficit present.     Mental Status: She is alert and oriented to person, place, and time. Mental status is at baseline.  Psychiatric:        Mood and Affect: Mood normal.        Behavior: Behavior normal.        Thought Content: Thought content normal.        Judgment: Judgment normal.           11/21/2023   11:36 AM 11/06/2023   10:25 AM 10/17/2023    1:10 PM 05/02/2023   10:23 AM 09/01/2022    2:04 PM  Depression screen PHQ 2/9  Decreased Interest 2 0 0 0 0  Down, Depressed, Hopeless 0 0 0  0 0  PHQ - 2 Score 2 0 0 0 0  Altered sleeping 1 1 1 1    Tired, decreased energy 3 3 3 3    Change in appetite 0 2 0 0   Feeling bad or failure about yourself  0 0 0 0   Trouble concentrating 0 0 0 0   Moving slowly or fidgety/restless 0 0 0 0   Suicidal thoughts 0 0 0 0   PHQ-9 Score 6 6 4 4    Difficult doing work/chores Not difficult at all Somewhat difficult Not difficult at all Not difficult at all       11/21/2023   11:36 AM 11/06/2023   10:27 AM 05/02/2023   10:23 AM 07/26/2022   10:02 AM  GAD 7 : Generalized Anxiety Score  Nervous, Anxious, on Edge 0 0 0 0  Control/stop worrying 0 0 1 0  Worry too much - different things 0 1 1 0  Trouble relaxing 0 0 0 0  Restless 0 0 0 0  Easily annoyed or irritable 1 1 0 0  Afraid - awful might happen 0 0 0 0  Total GAD 7 Score 1 2 2  0  Anxiety Difficulty Not difficult at all Not difficult at all Not difficult at all Not difficult at all    ASSESSMENT/PLAN:   Primary hypertension Assessment & Plan: Well controlled - Continue Amlodipine  5 mg daily - Monitor blood pressure regularly.   Post-traumatic osteoarthritis of right hip Assessment & Plan: Chronic pain from bulging discs compressing a nerve. Current management includes Tylenol , tramadol , and gabapentin . Discussed increasing Tylenol  dosage to manage pain more effectively and potentially reduce tramadol  usage. She is open to this approach. - Increase Tylenol  to 650 mg, two tablets in the morning and two tablets in the evening. - Continue tramadol  as needed for severe pain, with a maximum of three tablets per dose. - Consider lidocaine  patches for additional pain relief. - Continue gabapentin  as prescribed. - Discontinue meloxicam as it is ineffective. - Continue with scheduled injections by orthopedic specialist.   Class 2 severe obesity due to excess calories with serious comorbidity and body mass index (BMI) of 36.0 to 36.9 in adult [facility]) -     Comprehensive metabolic panel with GFR -     CBC with Differential/Platelet  Vitamin D  deficiency -     VITAMIN D  25 Hydroxy (Vit-D Deficiency, Fractures)  Vitamin B 12 deficiency -     Vitamin B12  Osteoporosis screening Assessment & Plan: Previously ordered Patient aware and to to schedule  Has never had screening in past.     PDMP reviewed  Return if symptoms worsen or fail to improve, for PCP.  Valli Gaw, MD

## 2023-11-21 NOTE — Patient Instructions (Addendum)
 It was a pleasure meeting you today. Thank you for allowing me to take part in your health care.  Our goals for today as we discussed include:  Take Tylenol  500 mg two tablets two times a day Stop Meloxicam  Follow up with Orthopedics for injections  Referral sent for Mammogram and Dexa scan. Please call to schedule appointment. [facility] [address] [place], [place] [address] [phone]    We will get some labs today.  If they are abnormal or we need to do something about them, I will call you.  If they are normal, I will send you a message on MyChart (if it is active) or a letter in the mail.  If you don't hear from us  in 2 weeks, please call the office at the number below.    This is a list of the screening recommended for you and due dates:  Health Maintenance  Topic Date Due   DEXA scan (bone density measurement)  Never done   Mammogram  07/20/2021   COVID-19 Vaccine (5 - 2024-25 season) 02/26/2023   DTaP/Tdap/Td vaccine (2 - Tdap) 12/10/2023   Flu Shot  01/26/2024   Medicare Annual Wellness Visit  10/16/2024   Colon Cancer Screening  02/12/2033   Pneumonia Vaccine  Completed   Hepatitis C Screening  Completed   Zoster (Shingles) Vaccine  Completed   HPV Vaccine  Aged Out   Meningitis B Vaccine  Aged Out      If you have any questions or concerns, please do not hesitate to call the office at [phone].  I look forward to our next visit and until then take care and stay safe.  Regards,   Valli Gaw, MD   [facility]

## 2023-11-26 ENCOUNTER — Ambulatory Visit: Payer: Self-pay | Admitting: Family Medicine

## 2023-11-26 ENCOUNTER — Encounter: Payer: Self-pay | Admitting: Family Medicine

## 2023-11-26 DIAGNOSIS — E559 Vitamin D deficiency, unspecified: Secondary | ICD-10-CM

## 2023-11-26 DIAGNOSIS — E538 Deficiency of other specified B group vitamins: Secondary | ICD-10-CM | POA: Insufficient documentation

## 2023-11-26 DIAGNOSIS — Z13820 Encounter for screening for osteoporosis: Secondary | ICD-10-CM | POA: Insufficient documentation

## 2023-11-26 MED ORDER — VITAMIN D (ERGOCALCIFEROL) 1.25 MG (50000 UNIT) PO CAPS
50000.0000 [IU] | ORAL_CAPSULE | ORAL | 3 refills | Status: AC
Start: 2023-11-26 — End: ?

## 2023-11-26 NOTE — Assessment & Plan Note (Signed)
 Chronic pain from bulging discs compressing a nerve. Current management includes Tylenol , tramadol , and gabapentin . Discussed increasing Tylenol  dosage to manage pain more effectively and potentially reduce tramadol  usage. She is open to this approach. - Increase Tylenol  to 650 mg, two tablets in the morning and two tablets in the evening. - Continue tramadol  as needed for severe pain, with a maximum of three tablets per dose. - Consider lidocaine  patches for additional pain relief. - Continue gabapentin  as prescribed. - Discontinue meloxicam as it is ineffective. - Continue with scheduled injections by orthopedic specialist.

## 2023-11-26 NOTE — Assessment & Plan Note (Signed)
 Well controlled - Continue Amlodipine  5 mg daily - Monitor blood pressure regularly.

## 2023-11-26 NOTE — Assessment & Plan Note (Signed)
 Previously ordered Patient aware and to to schedule  Has never had screening in past.

## 2023-11-28 NOTE — Discharge Instructions (Signed)

## 2023-11-30 ENCOUNTER — Ambulatory Visit
Admission: RE | Admit: 2023-11-30 | Discharge: 2023-11-30 | Disposition: A | Discharge status: Home / Self Care | Source: Ambulatory Visit | Attending: Student | Admitting: Student

## 2023-11-30 DIAGNOSIS — M5417 Radiculopathy, lumbosacral region: Secondary | ICD-10-CM

## 2023-11-30 DIAGNOSIS — M47816 Spondylosis without myelopathy or radiculopathy, lumbar region: Secondary | ICD-10-CM

## 2023-11-30 DIAGNOSIS — M5415 Radiculopathy, thoracolumbar region: Secondary | ICD-10-CM

## 2023-11-30 MED ORDER — METHYLPREDNISOLONE ACETATE 40 MG/ML INJ SUSP (RADIOLOG
80.0000 mg | Freq: Once | INTRAMUSCULAR | Status: AC
  Administered 2023-11-30: 80 mg via EPIDURAL

## 2023-11-30 MED ORDER — IOPAMIDOL (ISOVUE-M 200) INJECTION 41%
1.0000 mL | Freq: Once | INTRAMUSCULAR | Status: AC
  Administered 2023-11-30: 1 mL via EPIDURAL

## 2023-12-04 ENCOUNTER — Other Ambulatory Visit: Payer: Self-pay | Admitting: Internal Medicine

## 2023-12-04 NOTE — Telephone Encounter (Signed)
 Refilled: 09/01/2023 by Dr. Lovetta Rucks Last OV: 11/21/2023 with Dr. Sueanne Emerald Next OV: 05/06/2024 with Dr. Casimir Cleaver

## 2023-12-13 ENCOUNTER — Other Ambulatory Visit: Payer: Self-pay | Admitting: Student

## 2023-12-13 DIAGNOSIS — M5417 Radiculopathy, lumbosacral region: Secondary | ICD-10-CM

## 2023-12-13 DIAGNOSIS — M5416 Radiculopathy, lumbar region: Secondary | ICD-10-CM

## 2023-12-13 DIAGNOSIS — M47816 Spondylosis without myelopathy or radiculopathy, lumbar region: Secondary | ICD-10-CM

## 2024-02-01 ENCOUNTER — Other Ambulatory Visit: Payer: Self-pay

## 2024-02-01 DIAGNOSIS — R109 Unspecified abdominal pain: Secondary | ICD-10-CM

## 2024-02-01 NOTE — Telephone Encounter (Unsigned)
 Copied from CRM #[id number]. Topic: Clinical - Medication Refill >> Feb 01, 2024  4:43 PM Winona R wrote: Medication:  traMADol  (ULTRAM ) 50 MG tablet    Has the patient contacted their pharmacy? Yes, they stated they were requesting it however have not heard anything back   This is the patient's preferred pharmacy:  [id number] [address], [place] - [address] [address] Phone: [phone] Fax: [phone]  Is this the correct pharmacy for this prescription? Yes If no, delete pharmacy and type the correct one.   Has the prescription been filled recently? No since May   Is the patient out of the medication? No  Has the patient been seen for an appointment in the last year OR does the patient have an upcoming appointment? Yes  Can we respond through MyChart? Yes  Agent: Please be advised that Rx refills may take up to 3 business days. We ask that you follow-up with your pharmacy.

## 2024-02-04 MED ORDER — TRAMADOL HCL 50 MG PO TABS
100.0000 mg | ORAL_TABLET | Freq: Three times a day (TID) | ORAL | 0 refills | Status: AC | PRN
Start: 2024-02-04 — End: ?

## 2024-02-04 NOTE — Telephone Encounter (Signed)
Controlled substance database reviewed.  Medication sent to pharmacy.

## 2024-03-05 ENCOUNTER — Other Ambulatory Visit: Payer: Self-pay

## 2024-03-05 MED ORDER — GABAPENTIN 100 MG PO CAPS: ORAL_CAPSULE | ORAL | 0 refills | Status: DC

## 2024-03-05 NOTE — Telephone Encounter (Signed)
 Copied from CRM #[id number]. Topic: Clinical - Medication Refill >> Mar 05, 2024  9:11 AM Rea ORN wrote: Medication:  gabapentin  (NEURONTIN ) 100 MG capsule   Has the patient contacted their pharmacy? Yes (Agent: If no, request that the patient contact the pharmacy for the refill. If patient does not wish to contact the pharmacy document the reason why and proceed with request.) (Agent: If yes, when and what did the pharmacy advise?)  This is the patient's preferred pharmacy:  [id number] [place], [place] - [address] [place] [address] Phone: [phone] Fax: [phone]  Is this the correct pharmacy for this prescription? Yes If no, delete pharmacy and type the correct one.   Has the prescription been filled recently? No  Is the patient out of the medication? No (2 dose left)  Has the patient been seen for an appointment in the last year OR does the patient have an upcoming appointment? Yes  Can we respond through MyChart? Yes  Agent: Please be advised that Rx refills may take up to 3 business days. We ask that you follow-up with your pharmacy.

## 2024-03-07 ENCOUNTER — Other Ambulatory Visit: Payer: Self-pay

## 2024-03-07 MED ORDER — GABAPENTIN 100 MG PO CAPS: ORAL_CAPSULE | ORAL | 0 refills | Status: DC

## 2024-05-06 ENCOUNTER — Encounter

## 2024-07-19 ENCOUNTER — Encounter

## 2024-07-26 ENCOUNTER — Other Ambulatory Visit: Payer: Self-pay

## 2024-07-26 NOTE — Telephone Encounter (Signed)
 Copied from CRM #[id number]. Topic: Clinical - Medication Refill >> Jul 26, 2024 10:10 AM Montie POUR wrote: Medication:  gabapentin  (NEURONTIN ) 100 MG capsule  Has the patient contacted their pharmacy? Yes (Agent: If no, request that the patient contact the pharmacy for the refill. If patient does not wish to contact the pharmacy document the reason why and proceed with request.) (Agent: If yes, when and what did the pharmacy advise?) Pharmacy needs order to review  This is the patient's preferred pharmacy:  [id number] [place], [place] - [address] [place] [address] Phone: [phone] Fax: [phone]  Is this the correct pharmacy for this prescription? Yes If no, delete pharmacy and type the correct one.   Has the prescription been filled recently? No  Is the patient out of the medication? No - She has a few days left   Has the patient been seen for an appointment in the last year OR does the patient have an upcoming appointment? Yes - She has an appointment with Dr. Abbey on 09/17/24  Can we respond through MyChart? No  Agent: Please be advised that Rx refills may take up to 3 business days. We ask that you follow-up with your pharmacy.

## 2024-07-26 NOTE — Addendum Note (Signed)
 Addended by: Suzanne Kho on: 07/26/2024 02:41 PM   Modules accepted: Orders

## 2024-07-26 NOTE — Telephone Encounter (Signed)
 Refilled: 03/07/2024 Last OV: 11/21/2023  with Dr. Hope Next OV: 09/17/2024 TOC with Dr. Abbey

## 2024-07-27 MED ORDER — GABAPENTIN 100 MG PO CAPS: ORAL_CAPSULE | ORAL | 2 refills | Status: AC

## 2024-09-17 ENCOUNTER — Encounter

## 2024-10-23 ENCOUNTER — Ambulatory Visit
